# Patient Record
Sex: Female | Born: 1975 | Race: Black or African American | Hispanic: No | Marital: Married | Smoking: Never smoker
Health system: Southern US, Community
[De-identification: ages and names within clinical notes are randomized; demographics above are authoritative.]

## PROBLEM LIST (undated history)

## (undated) DIAGNOSIS — K219 Gastro-esophageal reflux disease without esophagitis: Secondary | ICD-10-CM

## (undated) DIAGNOSIS — M25362 Other instability, left knee: Secondary | ICD-10-CM

## (undated) DIAGNOSIS — F419 Anxiety disorder, unspecified: Secondary | ICD-10-CM

## (undated) DIAGNOSIS — F329 Major depressive disorder, single episode, unspecified: Secondary | ICD-10-CM

## (undated) DIAGNOSIS — T7840XA Allergy, unspecified, initial encounter: Secondary | ICD-10-CM

## (undated) DIAGNOSIS — K2970 Gastritis, unspecified, without bleeding: Secondary | ICD-10-CM

## (undated) DIAGNOSIS — M1990 Unspecified osteoarthritis, unspecified site: Secondary | ICD-10-CM

## (undated) DIAGNOSIS — F32A Depression, unspecified: Secondary | ICD-10-CM

## (undated) DIAGNOSIS — D649 Anemia, unspecified: Secondary | ICD-10-CM

## (undated) DIAGNOSIS — Z90710 Acquired absence of both cervix and uterus: Secondary | ICD-10-CM

## (undated) DIAGNOSIS — G4730 Sleep apnea, unspecified: Secondary | ICD-10-CM

## (undated) DIAGNOSIS — I517 Cardiomegaly: Secondary | ICD-10-CM

## (undated) DIAGNOSIS — K649 Unspecified hemorrhoids: Secondary | ICD-10-CM

## (undated) DIAGNOSIS — I10 Essential (primary) hypertension: Secondary | ICD-10-CM

## (undated) HISTORY — DX: Major depressive disorder, single episode, unspecified: F32.9

## (undated) HISTORY — DX: Essential (primary) hypertension: I10

## (undated) HISTORY — DX: Depression, unspecified: F32.A

## (undated) HISTORY — DX: Gastritis, unspecified, without bleeding: K29.70

## (undated) HISTORY — DX: Acquired absence of both cervix and uterus: Z90.710

## (undated) HISTORY — PX: WISDOM TOOTH EXTRACTION: SHX21

## (undated) HISTORY — DX: Unspecified hemorrhoids: K64.9

## (undated) HISTORY — DX: Allergy, unspecified, initial encounter: T78.40XA

## (undated) HISTORY — DX: Anxiety disorder, unspecified: F41.9

---

## 1997-07-04 ENCOUNTER — Other Ambulatory Visit: Admission: RE | Admit: 1997-07-04 | Discharge: 1997-07-04 | Payer: Self-pay | Admitting: *Deleted

## 1997-08-01 ENCOUNTER — Ambulatory Visit (HOSPITAL_COMMUNITY): Admission: RE | Admit: 1997-08-01 | Discharge: 1997-08-01 | Payer: Self-pay | Admitting: *Deleted

## 1997-09-05 ENCOUNTER — Ambulatory Visit (HOSPITAL_COMMUNITY): Admission: RE | Admit: 1997-09-05 | Discharge: 1997-09-05 | Payer: Self-pay | Admitting: *Deleted

## 1997-10-29 ENCOUNTER — Inpatient Hospital Stay (HOSPITAL_COMMUNITY): Admission: AD | Admit: 1997-10-29 | Discharge: 1997-10-29 | Payer: Self-pay | Admitting: Obstetrics & Gynecology

## 1997-11-12 ENCOUNTER — Inpatient Hospital Stay (HOSPITAL_COMMUNITY): Admission: AD | Admit: 1997-11-12 | Discharge: 1997-11-12 | Payer: Self-pay | Admitting: *Deleted

## 1997-11-13 ENCOUNTER — Inpatient Hospital Stay (HOSPITAL_COMMUNITY): Admission: AD | Admit: 1997-11-13 | Discharge: 1997-11-13 | Payer: Self-pay | Admitting: *Deleted

## 1997-11-13 ENCOUNTER — Inpatient Hospital Stay (HOSPITAL_COMMUNITY): Admission: AD | Admit: 1997-11-13 | Discharge: 1997-11-16 | Payer: Self-pay | Admitting: *Deleted

## 1998-06-14 ENCOUNTER — Other Ambulatory Visit: Admission: RE | Admit: 1998-06-14 | Discharge: 1998-06-14 | Payer: Self-pay

## 1998-06-14 ENCOUNTER — Other Ambulatory Visit: Admission: RE | Admit: 1998-06-14 | Discharge: 1998-06-14 | Payer: Self-pay | Admitting: Obstetrics

## 1999-06-20 ENCOUNTER — Other Ambulatory Visit: Admission: RE | Admit: 1999-06-20 | Discharge: 1999-06-20 | Payer: Self-pay | Admitting: Family Medicine

## 1999-08-26 ENCOUNTER — Inpatient Hospital Stay (HOSPITAL_COMMUNITY): Admission: AD | Admit: 1999-08-26 | Discharge: 1999-08-26 | Payer: Self-pay | Admitting: *Deleted

## 2000-03-21 ENCOUNTER — Emergency Department (HOSPITAL_COMMUNITY): Admission: EM | Admit: 2000-03-21 | Discharge: 2000-03-21 | Payer: Self-pay | Admitting: Emergency Medicine

## 2000-10-11 ENCOUNTER — Inpatient Hospital Stay (HOSPITAL_COMMUNITY): Admission: AD | Admit: 2000-10-11 | Discharge: 2000-10-11 | Payer: Self-pay | Admitting: *Deleted

## 2000-10-19 ENCOUNTER — Inpatient Hospital Stay (HOSPITAL_COMMUNITY): Admission: AD | Admit: 2000-10-19 | Discharge: 2000-10-19 | Payer: Self-pay | Admitting: Obstetrics

## 2000-11-22 ENCOUNTER — Inpatient Hospital Stay (HOSPITAL_COMMUNITY): Admission: AD | Admit: 2000-11-22 | Discharge: 2000-11-22 | Payer: Self-pay | Admitting: Obstetrics and Gynecology

## 2000-12-03 ENCOUNTER — Other Ambulatory Visit: Admission: RE | Admit: 2000-12-03 | Discharge: 2000-12-03 | Payer: Self-pay | Admitting: Obstetrics and Gynecology

## 2000-12-06 ENCOUNTER — Other Ambulatory Visit: Admission: RE | Admit: 2000-12-06 | Discharge: 2000-12-06 | Payer: Self-pay | Admitting: Obstetrics and Gynecology

## 2000-12-20 ENCOUNTER — Ambulatory Visit (HOSPITAL_COMMUNITY): Admission: RE | Admit: 2000-12-20 | Discharge: 2000-12-20 | Payer: Self-pay | Admitting: Obstetrics and Gynecology

## 2000-12-20 ENCOUNTER — Encounter: Payer: Self-pay | Admitting: Obstetrics and Gynecology

## 2001-03-03 ENCOUNTER — Ambulatory Visit (HOSPITAL_COMMUNITY): Admission: RE | Admit: 2001-03-03 | Discharge: 2001-03-03 | Payer: Self-pay | Admitting: Obstetrics and Gynecology

## 2001-03-03 ENCOUNTER — Encounter: Payer: Self-pay | Admitting: Obstetrics and Gynecology

## 2001-05-07 ENCOUNTER — Inpatient Hospital Stay (HOSPITAL_COMMUNITY): Admission: AD | Admit: 2001-05-07 | Discharge: 2001-05-07 | Payer: Self-pay | Admitting: Obstetrics and Gynecology

## 2001-05-08 ENCOUNTER — Inpatient Hospital Stay (HOSPITAL_COMMUNITY): Admission: AD | Admit: 2001-05-08 | Discharge: 2001-05-08 | Payer: Self-pay | Admitting: Obstetrics and Gynecology

## 2001-05-10 ENCOUNTER — Inpatient Hospital Stay (HOSPITAL_COMMUNITY): Admission: AD | Admit: 2001-05-10 | Discharge: 2001-05-12 | Payer: Self-pay | Admitting: Obstetrics and Gynecology

## 2001-05-10 ENCOUNTER — Encounter (INDEPENDENT_AMBULATORY_CARE_PROVIDER_SITE_OTHER): Payer: Self-pay

## 2001-05-20 ENCOUNTER — Inpatient Hospital Stay (HOSPITAL_COMMUNITY): Admission: AD | Admit: 2001-05-20 | Discharge: 2001-05-20 | Payer: Self-pay | Admitting: Obstetrics and Gynecology

## 2001-09-11 ENCOUNTER — Emergency Department (HOSPITAL_COMMUNITY): Admission: EM | Admit: 2001-09-11 | Discharge: 2001-09-11 | Payer: Self-pay | Admitting: Emergency Medicine

## 2002-02-17 ENCOUNTER — Encounter: Admission: RE | Admit: 2002-02-17 | Discharge: 2002-02-17 | Payer: Self-pay | Admitting: Family Medicine

## 2002-08-11 ENCOUNTER — Encounter: Admission: RE | Admit: 2002-08-11 | Discharge: 2002-08-11 | Payer: Self-pay | Admitting: Family Medicine

## 2002-08-18 ENCOUNTER — Encounter (INDEPENDENT_AMBULATORY_CARE_PROVIDER_SITE_OTHER): Payer: Self-pay | Admitting: *Deleted

## 2002-08-18 LAB — CONVERTED CEMR LAB

## 2002-09-11 ENCOUNTER — Emergency Department (HOSPITAL_COMMUNITY): Admission: EM | Admit: 2002-09-11 | Discharge: 2002-09-11 | Payer: Self-pay | Admitting: Emergency Medicine

## 2002-09-11 ENCOUNTER — Encounter: Payer: Self-pay | Admitting: Emergency Medicine

## 2002-09-13 ENCOUNTER — Encounter: Admission: RE | Admit: 2002-09-13 | Discharge: 2002-09-13 | Payer: Self-pay | Admitting: Family Medicine

## 2002-09-13 ENCOUNTER — Other Ambulatory Visit: Admission: RE | Admit: 2002-09-13 | Discharge: 2002-09-13 | Payer: Self-pay | Admitting: Family Medicine

## 2002-11-06 ENCOUNTER — Encounter: Admission: RE | Admit: 2002-11-06 | Discharge: 2002-11-06 | Payer: Self-pay | Admitting: Sports Medicine

## 2002-11-08 ENCOUNTER — Encounter: Admission: RE | Admit: 2002-11-08 | Discharge: 2002-11-08 | Payer: Self-pay | Admitting: Family Medicine

## 2003-03-20 ENCOUNTER — Encounter: Admission: RE | Admit: 2003-03-20 | Discharge: 2003-03-20 | Payer: Self-pay | Admitting: Sports Medicine

## 2003-05-23 ENCOUNTER — Emergency Department (HOSPITAL_COMMUNITY): Admission: EM | Admit: 2003-05-23 | Discharge: 2003-05-24 | Payer: Self-pay | Admitting: Emergency Medicine

## 2003-05-23 ENCOUNTER — Emergency Department (HOSPITAL_COMMUNITY): Admission: EM | Admit: 2003-05-23 | Discharge: 2003-05-23 | Payer: Self-pay | Admitting: Emergency Medicine

## 2003-06-22 ENCOUNTER — Encounter: Admission: RE | Admit: 2003-06-22 | Discharge: 2003-06-22 | Payer: Self-pay | Admitting: Family Medicine

## 2003-07-16 ENCOUNTER — Encounter: Admission: RE | Admit: 2003-07-16 | Discharge: 2003-07-16 | Payer: Self-pay | Admitting: Family Medicine

## 2004-02-04 ENCOUNTER — Ambulatory Visit: Payer: Self-pay | Admitting: Family Medicine

## 2004-03-25 ENCOUNTER — Ambulatory Visit: Payer: Self-pay | Admitting: Family Medicine

## 2004-04-11 ENCOUNTER — Ambulatory Visit: Payer: Self-pay | Admitting: Family Medicine

## 2004-05-02 ENCOUNTER — Ambulatory Visit: Payer: Self-pay | Admitting: Family Medicine

## 2005-05-26 ENCOUNTER — Ambulatory Visit: Payer: Self-pay | Admitting: Family Medicine

## 2005-05-29 ENCOUNTER — Ambulatory Visit: Payer: Self-pay | Admitting: Sports Medicine

## 2005-06-01 ENCOUNTER — Ambulatory Visit: Payer: Self-pay | Admitting: Sports Medicine

## 2005-08-30 ENCOUNTER — Emergency Department (HOSPITAL_COMMUNITY): Admission: EM | Admit: 2005-08-30 | Discharge: 2005-08-30 | Payer: Self-pay | Admitting: Emergency Medicine

## 2006-05-13 DIAGNOSIS — E669 Obesity, unspecified: Secondary | ICD-10-CM | POA: Insufficient documentation

## 2006-05-13 DIAGNOSIS — R519 Headache, unspecified: Secondary | ICD-10-CM | POA: Insufficient documentation

## 2006-05-13 DIAGNOSIS — R51 Headache: Secondary | ICD-10-CM | POA: Insufficient documentation

## 2006-05-14 ENCOUNTER — Encounter (INDEPENDENT_AMBULATORY_CARE_PROVIDER_SITE_OTHER): Payer: Self-pay | Admitting: *Deleted

## 2006-05-26 ENCOUNTER — Telehealth (INDEPENDENT_AMBULATORY_CARE_PROVIDER_SITE_OTHER): Payer: Self-pay | Admitting: *Deleted

## 2006-05-26 ENCOUNTER — Encounter (INDEPENDENT_AMBULATORY_CARE_PROVIDER_SITE_OTHER): Payer: Self-pay | Admitting: Family Medicine

## 2006-05-26 ENCOUNTER — Ambulatory Visit: Payer: Self-pay | Admitting: Sports Medicine

## 2006-05-26 DIAGNOSIS — I10 Essential (primary) hypertension: Secondary | ICD-10-CM | POA: Insufficient documentation

## 2006-06-30 ENCOUNTER — Other Ambulatory Visit: Admission: RE | Admit: 2006-06-30 | Discharge: 2006-06-30 | Payer: Self-pay | Admitting: Family Medicine

## 2006-06-30 ENCOUNTER — Encounter (INDEPENDENT_AMBULATORY_CARE_PROVIDER_SITE_OTHER): Payer: Self-pay | Admitting: Family Medicine

## 2006-06-30 ENCOUNTER — Ambulatory Visit: Payer: Self-pay | Admitting: Family Medicine

## 2006-06-30 LAB — CONVERTED CEMR LAB
Chlamydia, DNA Probe: NEGATIVE
GC Probe Amp, Genital: NEGATIVE

## 2006-07-05 ENCOUNTER — Ambulatory Visit: Payer: Self-pay | Admitting: Family Medicine

## 2006-07-05 ENCOUNTER — Encounter (INDEPENDENT_AMBULATORY_CARE_PROVIDER_SITE_OTHER): Payer: Self-pay | Admitting: Family Medicine

## 2006-07-07 ENCOUNTER — Encounter (INDEPENDENT_AMBULATORY_CARE_PROVIDER_SITE_OTHER): Payer: Self-pay | Admitting: Family Medicine

## 2006-07-07 ENCOUNTER — Telehealth: Payer: Self-pay | Admitting: *Deleted

## 2006-07-07 DIAGNOSIS — D509 Iron deficiency anemia, unspecified: Secondary | ICD-10-CM | POA: Insufficient documentation

## 2006-07-07 LAB — CONVERTED CEMR LAB
ALT: 11 units/L (ref 0–35)
AST: 13 units/L (ref 0–37)
Albumin: 3.9 g/dL (ref 3.5–5.2)
Alkaline Phosphatase: 62 units/L (ref 39–117)
Calcium: 9.2 mg/dL (ref 8.4–10.5)
Chloride: 106 meq/L (ref 96–112)
LDL Cholesterol: 67 mg/dL (ref 0–99)
Platelets: 424 10*3/uL — ABNORMAL HIGH (ref 150–400)
Potassium: 3.7 meq/L (ref 3.5–5.3)
RDW: 17.5 % — ABNORMAL HIGH (ref 11.5–14.0)
Sodium: 138 meq/L (ref 135–145)
TSH: 1.245 microintl units/mL (ref 0.350–5.50)
Total CHOL/HDL Ratio: 2.3

## 2006-07-08 ENCOUNTER — Telehealth (INDEPENDENT_AMBULATORY_CARE_PROVIDER_SITE_OTHER): Payer: Self-pay | Admitting: *Deleted

## 2006-09-16 ENCOUNTER — Encounter (INDEPENDENT_AMBULATORY_CARE_PROVIDER_SITE_OTHER): Payer: Self-pay | Admitting: Family Medicine

## 2007-05-28 ENCOUNTER — Emergency Department (HOSPITAL_COMMUNITY): Admission: EM | Admit: 2007-05-28 | Discharge: 2007-05-28 | Payer: Self-pay | Admitting: Emergency Medicine

## 2007-05-30 ENCOUNTER — Encounter (INDEPENDENT_AMBULATORY_CARE_PROVIDER_SITE_OTHER): Payer: Self-pay | Admitting: Family Medicine

## 2007-06-15 ENCOUNTER — Encounter (INDEPENDENT_AMBULATORY_CARE_PROVIDER_SITE_OTHER): Payer: Self-pay | Admitting: Family Medicine

## 2007-06-15 ENCOUNTER — Ambulatory Visit: Payer: Self-pay | Admitting: Family Medicine

## 2007-06-15 DIAGNOSIS — I517 Cardiomegaly: Secondary | ICD-10-CM | POA: Insufficient documentation

## 2007-06-15 LAB — CONVERTED CEMR LAB
ALT: 15 U/L
AST: 15 U/L
Albumin: 4 g/dL
Alkaline Phosphatase: 79 U/L
BUN: 11 mg/dL
CO2: 26 meq/L
Calcium: 9.9 mg/dL
Chloride: 102 meq/L
Creatinine, Ser: 0.93 mg/dL
Glucose, Bld: 88 mg/dL
Glucose, Urine, Semiquant: NEGATIVE
Ketones, urine, test strip: NEGATIVE
Potassium: 3.8 meq/L
Pro B Natriuretic peptide (BNP): 8 pg/mL
Sodium: 139 meq/L
Specific Gravity, Urine: 1.02
TSH: 0.629 u[IU]/mL
Total Bilirubin: 0.4 mg/dL
Total Protein: 7.5 g/dL
WBC Urine, dipstick: NEGATIVE

## 2007-06-16 ENCOUNTER — Encounter (INDEPENDENT_AMBULATORY_CARE_PROVIDER_SITE_OTHER): Payer: Self-pay | Admitting: Family Medicine

## 2007-06-22 ENCOUNTER — Ambulatory Visit (HOSPITAL_COMMUNITY): Admission: RE | Admit: 2007-06-22 | Discharge: 2007-06-22 | Payer: Self-pay | Admitting: Family Medicine

## 2007-06-22 ENCOUNTER — Encounter: Payer: Self-pay | Admitting: Family Medicine

## 2007-06-22 ENCOUNTER — Ambulatory Visit: Payer: Self-pay | Admitting: Internal Medicine

## 2007-06-22 ENCOUNTER — Telehealth: Payer: Self-pay | Admitting: *Deleted

## 2007-06-23 ENCOUNTER — Encounter (INDEPENDENT_AMBULATORY_CARE_PROVIDER_SITE_OTHER): Payer: Self-pay | Admitting: Family Medicine

## 2007-06-28 ENCOUNTER — Telehealth: Payer: Self-pay | Admitting: *Deleted

## 2007-07-19 ENCOUNTER — Telehealth (INDEPENDENT_AMBULATORY_CARE_PROVIDER_SITE_OTHER): Payer: Self-pay | Admitting: Family Medicine

## 2007-09-20 ENCOUNTER — Telehealth: Payer: Self-pay | Admitting: *Deleted

## 2007-12-16 ENCOUNTER — Ambulatory Visit: Payer: Self-pay | Admitting: Family Medicine

## 2007-12-16 ENCOUNTER — Encounter (INDEPENDENT_AMBULATORY_CARE_PROVIDER_SITE_OTHER): Payer: Self-pay | Admitting: Family Medicine

## 2007-12-22 ENCOUNTER — Encounter (INDEPENDENT_AMBULATORY_CARE_PROVIDER_SITE_OTHER): Payer: Self-pay | Admitting: Family Medicine

## 2008-02-06 ENCOUNTER — Ambulatory Visit: Payer: Self-pay | Admitting: Family Medicine

## 2008-02-06 ENCOUNTER — Telehealth: Payer: Self-pay | Admitting: *Deleted

## 2008-02-06 LAB — CONVERTED CEMR LAB: Rapid Strep: NEGATIVE

## 2008-03-13 ENCOUNTER — Telehealth (INDEPENDENT_AMBULATORY_CARE_PROVIDER_SITE_OTHER): Payer: Self-pay | Admitting: *Deleted

## 2008-10-29 ENCOUNTER — Emergency Department (HOSPITAL_COMMUNITY): Admission: EM | Admit: 2008-10-29 | Discharge: 2008-10-30 | Payer: Self-pay | Admitting: Emergency Medicine

## 2008-12-28 ENCOUNTER — Encounter: Payer: Self-pay | Admitting: *Deleted

## 2008-12-31 ENCOUNTER — Encounter: Payer: Self-pay | Admitting: *Deleted

## 2009-01-01 ENCOUNTER — Encounter: Payer: Self-pay | Admitting: Family Medicine

## 2009-01-01 ENCOUNTER — Ambulatory Visit: Payer: Self-pay | Admitting: Family Medicine

## 2009-01-01 LAB — CONVERTED CEMR LAB
BUN: 14 mg/dL (ref 6–23)
Chloride: 101 meq/L (ref 96–112)
Hemoglobin: 12 g/dL (ref 12.0–15.0)
MCHC: 29.5 g/dL — ABNORMAL LOW (ref 30.0–36.0)
Platelets: 401 10*3/uL — ABNORMAL HIGH (ref 150–400)
Potassium: 3.5 meq/L (ref 3.5–5.3)
RDW: 19.9 % — ABNORMAL HIGH (ref 11.5–15.5)
Sodium: 136 meq/L (ref 135–145)
TSH: 0.984 microintl units/mL (ref 0.350–4.500)

## 2009-01-07 ENCOUNTER — Encounter: Payer: Self-pay | Admitting: Family Medicine

## 2009-05-15 ENCOUNTER — Telehealth: Payer: Self-pay | Admitting: Family Medicine

## 2009-10-01 ENCOUNTER — Ambulatory Visit: Payer: Self-pay | Admitting: Family Medicine

## 2009-10-01 ENCOUNTER — Telehealth: Payer: Self-pay | Admitting: *Deleted

## 2009-10-01 DIAGNOSIS — G479 Sleep disorder, unspecified: Secondary | ICD-10-CM | POA: Insufficient documentation

## 2009-10-23 ENCOUNTER — Ambulatory Visit (HOSPITAL_BASED_OUTPATIENT_CLINIC_OR_DEPARTMENT_OTHER): Admission: RE | Admit: 2009-10-23 | Discharge: 2009-10-23 | Payer: Self-pay | Admitting: Family Medicine

## 2009-10-23 ENCOUNTER — Encounter: Payer: Self-pay | Admitting: Family Medicine

## 2009-10-26 ENCOUNTER — Ambulatory Visit: Payer: Self-pay | Admitting: Internal Medicine

## 2010-01-14 ENCOUNTER — Emergency Department (HOSPITAL_COMMUNITY): Admission: EM | Admit: 2010-01-14 | Discharge: 2010-01-14 | Payer: Self-pay | Admitting: Emergency Medicine

## 2010-01-17 ENCOUNTER — Encounter: Payer: Self-pay | Admitting: Family Medicine

## 2010-04-17 NOTE — Miscellaneous (Signed)
  Clinical Lists Changes  Problems: Removed problem of SCREENING FOR MALIGNANT NEOPLASM OF THE CERVIX (ICD-V76.2) Removed problem of GYNECOLOGICAL EXAMINATION, ROUTINE (ICD-V72.31) Removed problem of PICA (ICD-307.52) Removed problem of AFTERCARE, LONG-TERM USE, MEDICATIONS NEC (ICD-V58.69) Removed problem of FATIGUE (ICD-780.79) Removed problem of SCREENING FOR LIPOID DISORDERS (ICD-V77.91)

## 2010-04-17 NOTE — Assessment & Plan Note (Signed)
Summary: elevated bp,df   Vital Signs:  Patient profile:   35 year old female Height:      60.75 inches Weight:      179 pounds BMI:     34.22 Temp:     98.8 degrees F oral Pulse rate:   85 / minute BP sitting:   137 / 99  (left arm) Cuff size:   regular  Vitals Entered By: Garen Grams LPN (October 01, 2009 9:55 AM) CC: elevated BP Is Patient Diabetic? No Pain Assessment Patient in pain? no        CC:  elevated BP.  History of Present Illness: 1. Elevated BP:  - Pt tried to donate plasma but was told that her blood pressure was too high.  It was 157/116 according to patient at the center.  This was last wednesday - She hasn't checked her BP at home so unsure how it has been since then. - She is taking her Norvasc and HCTZ as prescribed  ROS: denies chest pain or shortness of breath  2. Sleep disorder - Snores at night - Is tired throughout the day - Has not been told that she stops breathing at night  ROS: endorses occ headache  3. Obesity - Doesn't watch what she eats - Has been gaining weight - Has been trying to start exercising  Habits & Providers  Alcohol-Tobacco-Diet     Tobacco Status: never  Current Medications (verified): 1)  Hydrochlorothiazide 25 Mg Tabs (Hydrochlorothiazide) .... Take 1 Tablet By Mouth Once A Day 2)  Amlodipine Besylate 10 Mg Tabs (Amlodipine Besylate) .... One By Mouth Daily 3)  Losartan Potassium 25 Mg Tabs (Losartan Potassium) .... Take 1 Tab By Mouth Daily  Allergies: No Known Drug Allergies  Past History:  Past Medical History: Reviewed history from 12/16/2007 and no changes required. anemia HTN  Social History: Reviewed history from 01/01/2009 and no changes required. Getting divorced.  3 kids at home (11, 10,6 in 2009).    Husband works at [employer] and does freight loading.  Denies tobacco, etoh, drugs.  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 Taking business management classes at [employer] (hopes to finish fall  2011).  Physical Exam  General:  General:  Vital signs reviewed -- overweight, hypertensive.  Alert, appropriate; well-dressed and well-nourished Lungs:  work of breathing unlabored, clear to auscultation bilaterally; no wheezes, rales, or ronchi; good air movement throughout Heart:  regular rate and rhythm, no murmurs; normal s1/s2 Abd: soft, NT/ND. No masses. + BS.   Pulses:  DP and radial pulses 2+ bilaterally  Extremities:  no cyanosis, clubbing; + trace LE edema Neurologic:  alert and oriented. speech normal.    Impression & Recommendations:  Problem # 1:  HYPERTENSION, BENIGN (ICD-401.1) Assessment Unchanged  Diastolic blood pressure is not at goal.  Will add Losartan.  Monitor for cough since she couldn't tolerate an ACEI. Her updated medication list for this problem includes:    Hydrochlorothiazide 25 Mg Tabs (Hydrochlorothiazide) .Marland Kitchen... Take 1 tablet by mouth once a day    Amlodipine Besylate 10 Mg Tabs (Amlodipine besylate) ..... One by mouth daily    Losartan Potassium 25 Mg Tabs (Losartan potassium) .Marland Kitchen... Take 1 tab by mouth daily  Orders: [facility]- Est  Level 4 (44010)  Problem # 2:  SLEEP DISORDER (ICD-780.50) Assessment: New Refer for sleep study.  May be related to elevated blood pressure Orders: Diagnostic Polysomnogram (Diagnostic Polysomno) [facility]- Est  Level 4 (27253)  Problem # 3:  OBESITY, NOS (ICD-278.00) Assessment: Unchanged  Went over brief dietary interventions.  Advised her to make an appointment to discuss only diet and nutrition.   Orders: [facility]- Est  Level 4 (16109)  Complete Medication List: 1)  Hydrochlorothiazide 25 Mg Tabs (Hydrochlorothiazide) .... Take 1 tablet by mouth once a day 2)  Amlodipine Besylate 10 Mg Tabs (Amlodipine besylate) .... One by mouth daily 3)  Losartan Potassium 25 Mg Tabs (Losartan potassium) .... Take 1 tab by mouth daily  Patient Instructions: 1)  For your blood pressure I am going to start you on another medicine  (Losartan) 2)  This is a similar medicine to Lisinopril so it may cause a cough, if it does please let me know 3)  We will get you set up for that sleep study 4)  Please schedule an appointment to discuss nutrition and weight loss when convenient 5)  Please schedule a follow up appointment to recheck your blood pressure in 6 weeks Prescriptions: LOSARTAN POTASSIUM 25 MG TABS (LOSARTAN POTASSIUM) Take 1 tab by mouth daily  #30 x 3   Entered and Authorized by:   Angelena Sole MD   Signed by:   Angelena Sole MD on 10/01/2009   Method used:   Electronically to        [facility] [address] (retail)       [address]       [place], [place]  [id number]       Ph: [phone]       Fax: [phone]   RxID:   [id number]

## 2010-04-17 NOTE — Progress Notes (Signed)
Summary: Rx Prob  Phone Note Call from Patient Call back at Home Phone [phone]   Caller: Patient Summary of Call: Would like to have the rx that was sent in today for her sent to [facility] [address]   Initial call taken by: Clydell Hakim,  October 01, 2009 11:41 AM  Follow-up for Phone Call        done.  Attempted to call.  LMOVM that rx was sent to [facility] [address] Follow-up by: Jone Baseman CMA,  October 01, 2009 12:07 PM    Prescriptions: LOSARTAN POTASSIUM 25 MG TABS (LOSARTAN POTASSIUM) Take 1 tab by mouth daily  #30 x 3   Entered by:   Jone Baseman CMA   Authorized by:   Angelena Sole MD   Signed by:   Jone Baseman CMA on 10/01/2009   Method used:   Electronically to        [facility] [phone]* (retail)       [address]       [place], [place]  [address]       Ph: [phone]       Fax: [phone]   RxID:   [id number]

## 2010-04-17 NOTE — Consult Note (Signed)
Summary: Sleep Study  Sleep Study   Imported By: Clydell Hakim 11/07/2009 11:13:46  _____________________________________________________________________  External Attachment:    Type:   Image     Comment:   External Document

## 2010-04-17 NOTE — Progress Notes (Signed)
Summary: Rx Req  Phone Note Refill Request Call back at [phone] UTIL 2  Message from:  Patient  Refills Requested: Medication #1:  AMLODIPINE BESYLATE 10 MG TABS one by mouth daily. PT USES [facility] [address]  Initial call taken by: Clydell Hakim,  May 15, 2009 9:11 AM  Follow-up for Phone Call        will forward to MD. Follow-up by: Theresia Lo RN,  May 15, 2009 10:14 AM    Prescriptions: AMLODIPINE BESYLATE 10 MG TABS (AMLODIPINE BESYLATE) one by mouth daily  #30 x 6   Entered and Authorized by:   Angelena Sole MD   Signed by:   Angelena Sole MD on 05/20/2009   Method used:   Electronically to        [facility] [address] (retail)       [address]       [place], [place]  [id number]       Ph: [phone]       Fax: [phone]   RxID:   [id number]

## 2010-04-23 ENCOUNTER — Other Ambulatory Visit: Payer: Self-pay | Admitting: Family Medicine

## 2010-05-01 ENCOUNTER — Encounter: Payer: Self-pay | Admitting: *Deleted

## 2010-05-01 NOTE — Progress Notes (Signed)
Intolerant to ACEI because of cough.  Will fill out PA form.

## 2010-05-01 NOTE — Progress Notes (Signed)
PA required for Losartan potassium. Form placed in MD box.   received PA approval . Pharmacy notified.

## 2010-05-21 ENCOUNTER — Encounter: Payer: Self-pay | Admitting: Family Medicine

## 2010-05-21 ENCOUNTER — Ambulatory Visit (INDEPENDENT_AMBULATORY_CARE_PROVIDER_SITE_OTHER): Payer: Medicaid Other | Admitting: Family Medicine

## 2010-05-21 VITALS — BP 139/97 | HR 83 | Temp 98.0°F | Ht 60.75 in | Wt 181.4 lb

## 2010-05-21 DIAGNOSIS — F419 Anxiety disorder, unspecified: Secondary | ICD-10-CM

## 2010-05-21 DIAGNOSIS — F411 Generalized anxiety disorder: Secondary | ICD-10-CM

## 2010-05-21 DIAGNOSIS — I10 Essential (primary) hypertension: Secondary | ICD-10-CM

## 2010-05-21 MED ORDER — HYDROCHLOROTHIAZIDE 25 MG PO TABS: 25.0000 mg | ORAL_TABLET | Freq: Every day | ORAL | Status: DC

## 2010-05-21 MED ORDER — PROPRANOLOL HCL 20 MG PO TABS: 20.0000 mg | ORAL_TABLET | Freq: Two times a day (BID) | ORAL | Status: DC | PRN

## 2010-05-21 NOTE — Assessment & Plan Note (Signed)
Pt with some symptoms of anxiety.  Does not seem to interfere with her normal daily activity.  She is not interested in starting a daily medication.  Will try Propranolol taken as needed.

## 2010-05-21 NOTE — Patient Instructions (Signed)
We will restart the HCTZ for your blood pressure, also try and cut back on the salt I am going to add Inderall for the anxiety.  Take it as needed when you feel anxious.  This takes about 30 minutes to work Please schedule a PAP when it is convenient for you

## 2010-05-21 NOTE — Assessment & Plan Note (Signed)
Diastolic BP not at goal.  She has stopped taking the HCTZ on her own.  Will restart this.  Also advised her to cut back on the salt.

## 2010-05-21 NOTE — Progress Notes (Signed)
  Subjective:    Patient ID: Wendy Duffy, female    DOB: 05-29-1975, 35 y.o.   MRN: [id number]  Hypertension This is a chronic problem. The current episode started more than 1 year ago. The problem is unchanged. The problem is uncontrolled. Associated symptoms include anxiety. Pertinent negatives include no blurred vision, chest pain, headaches, palpitations, peripheral edema or shortness of breath. There are no associated agents to hypertension. Risk factors for coronary artery disease include sedentary lifestyle. Past treatments include angiotensin blockers and calcium channel blockers (stopped taking the HCTZ on her own). The current treatment provides mild improvement.  Anxiety Presents for initial visit. Onset was 1 to 5 years ago. The problem has been gradually worsening. Symptoms include decreased concentration, excessive worry, irritability, nervous/anxious behavior, obsessions and restlessness. Patient reports no chest pain, compulsions, confusion, depressed mood, hyperventilation, impotence, insomnia, palpitations, panic, shortness of breath or suicidal ideas. Symptoms occur most days. The severity of symptoms is moderate. The symptoms are aggravated by nothing. The quality of sleep is fair. Nighttime awakenings: occasional.   Risk factors include no known risk factors. Past treatments include nothing.      Review of Systems  Constitutional: Positive for irritability.  Eyes: Negative for blurred vision.  Respiratory: Negative for shortness of breath.   Cardiovascular: Negative for chest pain and palpitations.  Genitourinary: Negative for impotence.  Neurological: Negative for headaches.  Psychiatric/Behavioral: Positive for decreased concentration. Negative for suicidal ideas and confusion. The patient is nervous/anxious. The patient does not have insomnia.        Objective:   Physical Exam  Constitutional: She is oriented to person, place, and time. No distress.        Overweight   HENT:  Head: Normocephalic and atraumatic.  Eyes: EOM are normal. Pupils are equal, round, and reactive to light.  Neck: Neck supple. No thyromegaly present.  Cardiovascular: Normal rate, regular rhythm and normal heart sounds.   No murmur heard. Pulmonary/Chest: Effort normal and breath sounds normal. No respiratory distress. She has no wheezes.  Abdominal: Soft. She exhibits no distension. There is no tenderness. There is no rebound.  Musculoskeletal: Normal range of motion. She exhibits no edema.  Neurological: She is alert and oriented to person, place, and time.  Skin: Skin is warm and dry.  Psychiatric:       Moderately anxious          Assessment & Plan:

## 2010-05-27 LAB — BASIC METABOLIC PANEL
CO2: 23 mEq/L (ref 19–32)
Calcium: 8.3 mg/dL — ABNORMAL LOW (ref 8.4–10.5)
Creatinine, Ser: 1.24 mg/dL — ABNORMAL HIGH (ref 0.4–1.2)
GFR calc Af Amer: 60 mL/min — ABNORMAL LOW (ref >60)
GFR calc non Af Amer: 50 mL/min — ABNORMAL LOW (ref >60)

## 2010-05-27 LAB — CBC
MCH: 26 pg (ref 26.0–34.0)
Platelets: 337 10*3/uL (ref 150–400)
RBC: 5.26 MIL/uL — ABNORMAL HIGH (ref 3.87–5.11)

## 2010-05-27 LAB — DIFFERENTIAL
Basophils Relative: 1 % (ref 0–1)
Eosinophils Absolute: 0.8 10*3/uL — ABNORMAL HIGH (ref 0.0–0.7)
Lymphs Abs: 3.2 10*3/uL (ref 0.7–4.0)
Neutrophils Relative %: 47 % (ref 43–77)

## 2010-05-27 LAB — POCT CARDIAC MARKERS: Myoglobin, poc: 22.3 ng/mL (ref 12–200)

## 2010-06-12 ENCOUNTER — Encounter: Payer: Medicaid Other | Admitting: Family Medicine

## 2010-06-16 ENCOUNTER — Other Ambulatory Visit: Payer: Self-pay | Admitting: Family Medicine

## 2010-06-16 NOTE — Telephone Encounter (Signed)
Refill request

## 2010-06-18 NOTE — Telephone Encounter (Signed)
Refill request

## 2010-06-21 LAB — D-DIMER, QUANTITATIVE: D-Dimer, Quant: 0.37 ug/mL-FEU (ref 0.00–0.48)

## 2010-06-21 LAB — POCT CARDIAC MARKERS: Troponin i, poc: <0.05 ng/mL (ref 0.00–0.09)

## 2010-06-21 LAB — CBC
Hemoglobin: 9.6 g/dL — ABNORMAL LOW (ref 12.0–15.0)
MCHC: 32.1 g/dL (ref 30.0–36.0)
MCV: 68.2 fL — ABNORMAL LOW (ref 78.0–100.0)
RBC: 4.39 MIL/uL (ref 3.87–5.11)

## 2010-06-21 LAB — BASIC METABOLIC PANEL
CO2: 23 mEq/L (ref 19–32)
Chloride: 108 mEq/L (ref 96–112)
GFR calc Af Amer: >60 mL/min (ref >60)
Sodium: 137 mEq/L (ref 135–145)

## 2010-06-21 LAB — DIFFERENTIAL
Basophils Relative: 0 % (ref 0–1)
Eosinophils Absolute: 0.5 10*3/uL (ref 0.0–0.7)
Monocytes Absolute: 0.8 10*3/uL (ref 0.1–1.0)
Neutro Abs: 4.1 10*3/uL (ref 1.7–7.7)

## 2010-08-01 NOTE — Discharge Summary (Signed)
[facility]  Patient:    Wendy Duffy, Wendy Duffy Visit Number: [id number] MRN: [id number]          Service Type: MED Location: [facility] Attending Physician:  Silverio Lay A Dictated by:   Pierre Bali Normand Sloop, M.D. Admit Date:  05/20/2001 Discharge Date: 05/20/2001                             Discharge Summary  DISCHARGE MEDICATIONS:        Tylox, Aldomet.  PROCEDURES:                   The patient had a repeat cesarean section and bilateral tubal ligation.  HOSPITAL COURSE:              The patient remained afebrile with stable vital signs.  Blood pressure in the 136/80-90.  The incision remained clean, dry and intact.  Postoperative hemoglobin was stable.  The patient was postoperative day #2 status post cesarean section with stable vital signs and desiring to go home.  She was on Aldomet.  DISPOSITION:                  She was to follow-up in the office with blood pressure check and staple removal.  She will remain on pelvic rest.  Discharge precautions were given. Dictated by:   Pierre Bali. Normand Sloop, M.D. Attending Physician:  Esmeralda Arthur DD:  06/30/01 TD:  06/30/01 Job: [id number]

## 2010-08-01 NOTE — Op Note (Signed)
[facility]  Patient:    Wendy Duffy, Wendy Duffy Visit Number: [id number] MRN: [id number]          Service Type: OBS Location: 910A 9115 [id number] Attending Physician:  Michael Litter Proc. Date: 05/10/01 Admit Date:  05/10/2001                             Operative Report  PREOPERATIVE DIAGNOSIS:       Pregnancy at term, desires repeat cesarean section and sterilization.  History of cesarean section in the past and an infant with osteogenesis imperfecta.  POSTOPERATIVE DIAGNOSIS:      Pregnancy at term, desires repeat cesarean section and sterilization.  History of cesarean section in the past and an infant with osteogenesis imperfecta.  OPERATION:                    Repeat cesarean section and bilateral tubal ligation.  SURGEON:                      Dr. Normand Sloop  ASSISTANT:                    Nigel Bridgeman, C.N.M.  ANESTHESIA:                   Spinal.  ESTIMATED BLOOD LOSS:         500 cc.  IV FLUIDS:                    3000 cc crystalloid.  URINE OUTPUT:                 550 cc clear urine.  COMPLICATIONS:                None.  FINDINGS:                     Female infant in vertex presentation with Apgars of 9 and 9, born at 94.  Clear fluid, weight was 6 pounds 11 ounces. The patient had normal uterus, tubes, and ovaries bilaterally.  DESCRIPTION OF PROCEDURE:     The patient was taken to the operating room where she was given spinal anesthesia, put in dorsal supine position with a left lateral tilt, and prepped and draped in the usual sterile fashion. Foley catheter was placed.  A Pfannenstiel skin incision was made with a knife over her previous incision.  The incision was carried down to the fascia using Bovie cautery.  The fascia was incised using the Bovie and extended bilaterally using Bovie cautery.  Kochers x2 was placed on the anterior aspect of the fascia which was elevated off the rectus muscle both bluntly and sharply using Bovie  cautery.  The inferior aspect of the fascia was elevated off of the rectus muscle in a similar fashion.  The rectus muscles were then separated in the midline.  The peritoneum was identified, tented up, and entered sharply with Metzenbaum scissors.  The rectus muscles were then further separated and the vesicouterine peritoneum was identified, tented up, and entered sharply.  The bladder flap was then created both sharply and digitally.  The bladder blade was then inserted.  A low transverse incision was then made with the knife and the incision was extended bluntly.  The infants head was vacuum assisted delivered out of the incision with one pull at 500 mmHg in the green zone.  The vacuum was then removed.  The nares and mouth were suctioned with bulb suction.  There was no nuchal cord and no meconium. The body was delivered and handed to pediatrics in attendance. Cord was clamped and cut.  Cord gases were sent.  The placenta was delivered and the uterus was then cleared of all clot and debris.  The uterine incision was repaired with 0 Vicryl in a running locked fashion.  Hemostasis was assured. Attention was then turned to the patients left fallopian tube which was located and grasped with Babcock clamp.  Followed out to the fimbriated end, about 2 cm of tube was ligated and resected in the midisthmic portion of the tube. Hemostasis was assured.  The patients right fallopian tube was located, identified, and ligated in a similar fashion.  Hemostasis was assured.  It was ligated with 2-0 plain.  Attention was then turned to the uterine incision which was found to be hemostatic.  The rectus muscles were then reapproximated using 0 Vicryl.  The fascia was closed with 0 Vicryl in a running fashion. The subcutaneous tissue was irrigated and made hemostatic using Bovie cautery. The skin was closed with staples.  Sponge, needle, and instrument counts were correct x2.  The patient went to the  recovery room in stable condition. Attending Physician:  Michael Litter DD:  05/10/01 TD:  05/10/01 Job: [id number]

## 2010-08-01 NOTE — H&P (Signed)
[facility]  PatientKAELAN, Wendy Duffy Visit Number: [id number] MRN: [id number]          Service Type: Attending:  Naima A. Normand Sloop, M.D. Dictated by:   Pierre Bali. Normand Sloop, M.D.                           History and Physical  HISTORY:                      Patient is a 35 year old G6, P2-0-3-2.  Last menstrual period was August 14, 2000.  Estimated date of confinement of May 21, 2001 consistent with 18 week ultrasound.  Presenting at 38 3/7 weeks for repeat cesarean section.  Patient has a history of an infant born with osteogenesis imperfecta and has chosen to go with a cesarean section and tubal ligation with this pregnancy.  Patients complications of this pregnancy are history of a previous cesarean section with subsequent VBAC; however, desires repeat cesarean section and tubal ligation.  Patient has signed tubal papers. She has a history of an infant with osteogenesis imperfecta and was referred to genetic counseling.  She has chronic history currently on Aldomet 250 mg t.i.d.  Has had NSTs two times a week and growth scans every four weeks. Patient had bilateral renal pyelectasis on her first ultrasound at 18 weeks which resolved on an ultrasound at 28 weeks.  PAST OBSTETRICAL HISTORY:     Significant for elective abortion x3 all in the first trimester without any complications.  She had a cesarean section in 1998 secondary to face presentation and had a subsequent VBAC in September 1999 without any complications.  PAST GYNECOLOGIC HISTORY:     Significant for history of chlamydia and Trichomonas.  Also had a colposcopy in 2000 but her last Pap was within normal limits.  SOCIAL HISTORY:               Negative x3.  PRENATAL LABORATORIES:        Hemoglobin 13.4.  Platelets 241,000.  She is O+. Antibody negative.  Sickle cell trait was negative.  RPR was nonreactive.  She is rubella immune.  Hepatitis B surface antigen was negative.  HIV  was nonreactive.  Pap test is within normal limits.  She did have 100,000 E. coli in her urine.  Test of cure was negative.  GC and chlamydia both negative. One hour glucola was 75.  AFP and free beta were within normal range.                                Patient is pregnant at term, desires repeat cesarean section and bilateral tubal ligation.  Patient told that the risks are, but not limited to, bleeding, infection, damage to internal organs such as bowel and bladder.  Patient understands and will have a repeat cesarean section and tubal ligation in the morning. Dictated by:   Pierre Bali. Normand Sloop, M.D. Attending:  Naima A. Dillard, M.D. DD:  05/09/01 TD:  05/09/01 Job: [id number]

## 2010-11-22 ENCOUNTER — Other Ambulatory Visit: Payer: Self-pay | Admitting: Family Medicine

## 2010-11-23 NOTE — Telephone Encounter (Signed)
Refill request

## 2010-11-24 NOTE — Telephone Encounter (Signed)
Will give 2 months of Norvasc. Patient started on HCTZ in March 2012. Will need follow-up with me next 1-2 months for labs and to re-evaluate blood pressure.

## 2010-12-04 ENCOUNTER — Encounter: Payer: Medicaid Other | Admitting: Family Medicine

## 2010-12-08 LAB — DIFFERENTIAL
Basophils Absolute: 0
Lymphocytes Relative: 41
Monocytes Absolute: 0.7
Neutro Abs: 2.3
Neutrophils Relative %: 40 — ABNORMAL LOW

## 2010-12-08 LAB — POCT I-STAT CREATININE: Operator id: 151321

## 2010-12-08 LAB — I-STAT 8, (EC8 V) (CONVERTED LAB)
Acid-Base Excess: 1
Chloride: 103
HCT: 40
Hemoglobin: 13.6
Potassium: 3.4 — ABNORMAL LOW
Sodium: 138
pH, Ven: 7.329 — ABNORMAL HIGH

## 2010-12-08 LAB — CBC
Hemoglobin: 11 — ABNORMAL LOW
RBC: 5.4 — ABNORMAL HIGH
RDW: 19.8 — ABNORMAL HIGH

## 2010-12-08 LAB — POCT CARDIAC MARKERS: Myoglobin, poc: 21.9

## 2010-12-12 ENCOUNTER — Ambulatory Visit (INDEPENDENT_AMBULATORY_CARE_PROVIDER_SITE_OTHER): Payer: BC Managed Care – HMO | Admitting: Family Medicine

## 2010-12-12 ENCOUNTER — Encounter: Payer: Self-pay | Admitting: Family Medicine

## 2010-12-12 ENCOUNTER — Other Ambulatory Visit (HOSPITAL_COMMUNITY)
Admission: RE | Admit: 2010-12-12 | Discharge: 2010-12-12 | Disposition: A | Discharge status: Home / Self Care | Payer: BC Managed Care – HMO | Source: Ambulatory Visit | Attending: Family Medicine | Admitting: Family Medicine

## 2010-12-12 ENCOUNTER — Encounter: Payer: Self-pay | Admitting: *Deleted

## 2010-12-12 DIAGNOSIS — G479 Sleep disorder, unspecified: Secondary | ICD-10-CM

## 2010-12-12 DIAGNOSIS — F411 Generalized anxiety disorder: Secondary | ICD-10-CM

## 2010-12-12 DIAGNOSIS — I10 Essential (primary) hypertension: Secondary | ICD-10-CM

## 2010-12-12 DIAGNOSIS — Z01419 Encounter for gynecological examination (general) (routine) without abnormal findings: Secondary | ICD-10-CM | POA: Insufficient documentation

## 2010-12-12 DIAGNOSIS — Z124 Encounter for screening for malignant neoplasm of cervix: Secondary | ICD-10-CM

## 2010-12-12 DIAGNOSIS — A749 Chlamydial infection, unspecified: Secondary | ICD-10-CM

## 2010-12-12 DIAGNOSIS — N760 Acute vaginitis: Secondary | ICD-10-CM

## 2010-12-12 DIAGNOSIS — Z0000 Encounter for general adult medical examination without abnormal findings: Secondary | ICD-10-CM

## 2010-12-12 DIAGNOSIS — F419 Anxiety disorder, unspecified: Secondary | ICD-10-CM

## 2010-12-12 DIAGNOSIS — A7489 Other chlamydial diseases: Secondary | ICD-10-CM

## 2010-12-12 DIAGNOSIS — Z20828 Contact with and (suspected) exposure to other viral communicable diseases: Secondary | ICD-10-CM

## 2010-12-12 DIAGNOSIS — E669 Obesity, unspecified: Secondary | ICD-10-CM

## 2010-12-12 LAB — CBC
HCT: 38.8 % (ref 36.0–46.0)
MCV: 82 fL (ref 78.0–100.0)
RBC: 4.73 MIL/uL (ref 3.87–5.11)
WBC: 7.9 10*3/uL (ref 4.0–10.5)

## 2010-12-12 LAB — RPR

## 2010-12-12 NOTE — Assessment & Plan Note (Signed)
Pap smear today.  Wants to also be tested for STIs. Flu shot today.

## 2010-12-12 NOTE — Assessment & Plan Note (Signed)
Controlled on current medications. Continue. Checking monitoring labs today.

## 2010-12-12 NOTE — Progress Notes (Signed)
  Subjective:    Patient ID: Wendy Duffy, female    DOB: [birth year], 35 y.o.   MRN: [id number]  HPI This is a 35 YO AA F c h/o HTN and anxiety. Here for yearly physical.  1. Obesity Started working again Not dieting or on exercise regimen  2. HTN Compliant with medications ROS: denies CP, DOE, LE edema  3. Difficulty staying asleep sometimes Unchanged Still waking up sometimes around 2am and not able to fall back asleep Doesn't feel rested in morning some times. Thinks she snores.  4. Anxiety Uses propranolol maybe twice weekly Helps her anxiety but does make her sleepy ROS: denies symptoms of depression except for sleep issue above  Review of Systems Per HPI    Objective:   Physical Exam  Constitutional: She is oriented to person, place, and time. No distress.       obese  HENT:  Head: Normocephalic and atraumatic.  Eyes: Conjunctivae are normal.  Neck: Normal range of motion. Neck supple. No JVD present. No tracheal deviation present. No thyromegaly present.  Cardiovascular: Normal rate, regular rhythm, normal heart sounds and intact distal pulses.  Exam reveals no gallop.   No murmur heard. Pulmonary/Chest: Breath sounds normal. No respiratory distress. She has no wheezes. She has no rales.  Abdominal: Soft. Bowel sounds are normal. She exhibits no distension and no mass. There is no tenderness. There is no rebound and no guarding.  Genitourinary: Vagina normal and uterus normal.       Scant white vaginal discharge  Musculoskeletal: Normal range of motion. She exhibits no edema.  Lymphadenopathy:    She has no cervical adenopathy.  Neurological: She is alert and oriented to person, place, and time. She displays normal reflexes. She exhibits normal muscle tone.  Skin: Skin is dry. No rash noted. No erythema.  Psychiatric: She has a normal mood and affect. Her behavior is normal. Judgment and thought content normal.          Assessment & Plan:

## 2010-12-12 NOTE — Assessment & Plan Note (Signed)
Commended and encouraged weight loss.  Recommended healthy diet, regular exercise.

## 2010-12-12 NOTE — Assessment & Plan Note (Signed)
Symptoms seems controlled on occasional propranolol which she uses twice weekly. Continue.  Follow-up 6 months.

## 2010-12-12 NOTE — Patient Instructions (Signed)
It was nice to meet you today.  If your lab results are normal, I will send you a letter with the results. If abnormal, someone at the clinic will get in touch with you.   Follow-up in 6 months for your blood pressure and anxiety medications.  Keep up the good work with your losing weight.

## 2010-12-12 NOTE — Assessment & Plan Note (Signed)
Per patient, had sleep study done recently but found normal. But she does have many symptoms and signs of OSA. Encourage weight loss for now.

## 2010-12-13 ENCOUNTER — Telehealth: Payer: Self-pay | Admitting: Family Medicine

## 2010-12-13 LAB — BASIC METABOLIC PANEL
Calcium: 9.4 mg/dL (ref 8.4–10.5)
Potassium: 3.7 mEq/L (ref 3.5–5.3)
Sodium: 139 mEq/L (ref 135–145)

## 2010-12-13 LAB — HIV ANTIBODY (ROUTINE TESTING W REFLEX): HIV: NONREACTIVE

## 2010-12-13 NOTE — Telephone Encounter (Signed)
Called and left a message and asked pt to call back regarding her recent glucose of 38 in lab on the 12/12/10. She will page me.

## 2010-12-15 ENCOUNTER — Encounter: Payer: Self-pay | Admitting: Family Medicine

## 2010-12-15 DIAGNOSIS — A749 Chlamydial infection, unspecified: Secondary | ICD-10-CM | POA: Insufficient documentation

## 2010-12-15 LAB — GC/CHLAMYDIA PROBE AMP, GENITAL
Chlamydia, DNA Probe: POSITIVE — AB
GC Probe Amp, Genital: NEGATIVE

## 2010-12-15 MED ORDER — AZITHROMYCIN 1 G PO PACK: 1.0000 | PACK | Freq: Once | ORAL | Status: DC

## 2010-12-15 MED ORDER — AZITHROMYCIN 1 G PO PACK: 1.0000 | PACK | Freq: Once | ORAL | Status: AC

## 2010-12-15 NOTE — Progress Notes (Signed)
Addended by: Madolyn Frieze, Marylene Land J on: 12/15/2010 05:55 PM   Modules accepted: Orders

## 2010-12-15 NOTE — Assessment & Plan Note (Addendum)
Found on routine STI screening. Will treat with azithromycin 1gm x1.  Will give Rx for partner as well. Avoid sex for 1 week after taking antibiotic.   Update: patient's voice mail box is full. Will send letter with results and notifying her prescriptions were sent.

## 2010-12-15 NOTE — Progress Notes (Signed)
Addended by: Madolyn Frieze, Marylene Land J on: 12/15/2010 05:53 PM   Modules accepted: Orders

## 2010-12-18 ENCOUNTER — Other Ambulatory Visit: Payer: BC Managed Care – HMO

## 2010-12-18 ENCOUNTER — Other Ambulatory Visit: Payer: Self-pay | Admitting: Family Medicine

## 2010-12-18 ENCOUNTER — Telehealth: Payer: Self-pay | Admitting: Family Medicine

## 2010-12-18 DIAGNOSIS — A749 Chlamydial infection, unspecified: Secondary | ICD-10-CM

## 2010-12-18 MED ORDER — AZITHROMYCIN 1 G PO PACK: 1.0000 | PACK | Freq: Once | ORAL | Status: AC

## 2010-12-18 NOTE — Telephone Encounter (Signed)
Addended by: Madolyn Frieze, Marylene Land J on: 12/18/2010 02:43 PM   Modules accepted: Orders

## 2010-12-18 NOTE — Telephone Encounter (Signed)
Ms. Wendy Duffy called back because she thought she had missed the call from the office.

## 2010-12-18 NOTE — Progress Notes (Signed)
CBG done today, reading 92

## 2010-12-18 NOTE — Telephone Encounter (Signed)
Patient is concerned about her low blood sugar reading. She will stop by today for a fingerstick glucose check. Notified Britta Mccreedy who will let lab know.  Will also send another Rx for azithromycin slurry for treatment for partner (pt positive for chlamydia).

## 2010-12-18 NOTE — Telephone Encounter (Signed)
Called patient back regarding this blood glucose level. Her voicemail was full so I was unable to leave a message. I would recommend going to clinic today or tomorrow for a fingerstick glucose.  Will route to Dr. Madolyn Frieze and [facility] team.

## 2010-12-18 NOTE — Telephone Encounter (Signed)
Patient returned call regarding glucose level.  Pt is quite anxious about this.  Need to speak with someone asap to inquire what that low level could actual create.

## 2010-12-31 ENCOUNTER — Other Ambulatory Visit: Payer: Self-pay | Admitting: Family Medicine

## 2010-12-31 NOTE — Telephone Encounter (Signed)
Refill request

## 2011-02-10 ENCOUNTER — Other Ambulatory Visit: Payer: Self-pay | Admitting: Family Medicine

## 2011-02-10 NOTE — Telephone Encounter (Signed)
Refill request

## 2011-02-20 ENCOUNTER — Ambulatory Visit: Payer: BC Managed Care – HMO | Admitting: Family Medicine

## 2011-03-12 ENCOUNTER — Encounter: Payer: Self-pay | Admitting: Family Medicine

## 2011-03-12 ENCOUNTER — Ambulatory Visit (INDEPENDENT_AMBULATORY_CARE_PROVIDER_SITE_OTHER): Payer: BC Managed Care – HMO | Admitting: Family Medicine

## 2011-03-12 DIAGNOSIS — R42 Dizziness and giddiness: Secondary | ICD-10-CM

## 2011-03-12 DIAGNOSIS — R109 Unspecified abdominal pain: Secondary | ICD-10-CM | POA: Insufficient documentation

## 2011-03-12 NOTE — Assessment & Plan Note (Signed)
Nonspecific symptoms with no evidence of focal neurologic signs.  Likely multifactorial with increased stressed, decreased frequency of eating, viral uri,  complicated by anxiety disorder.  Advised to eat regular meals, taking ibuprofen instead of hydrocodone for pain to avoid next morning drowsiness and increase fluids.    Given red flags for return or for urgent evaluation.

## 2011-03-12 NOTE — Assessment & Plan Note (Signed)
Nonspecific, now resolved.  Likely dyspepsia after long periods of time not eating.  Advised eating regularly and if recurs may have trial of zantac or other OTC  Medication for GERD.

## 2011-03-12 NOTE — Patient Instructions (Signed)
At regular meals- at least every 3-4 hours.  Try to make sure each snack has a little protein in it.  Drink lots of water- stay well hydrated  Follow-up if worsening or new symtpoms

## 2011-03-12 NOTE — Progress Notes (Signed)
  Subjective:    Patient ID: Wendy Duffy, female    DOB: 1975-09-07, 35 y.o.   MRN: [id number]  HPI Here for work in visit:  Concerned, had dizziness for 20 minutes yesterday, then had headache which lasted for two hours.  Character similar to times when she previous had a sinus infection per patient.  Took hydrocodone with full relief.  Now resolved but has residual frontal pressure.  Took blood pressure yesterday and it was fine. Notes both legs were a "little tingly" for a short period of time.  Felt very scared.  Denies palpitations, bur ROS positive for stress around the holidays.   Did not try taking her propranolol with this episode.  2-3 weeks ago had abdominal pain that feels like "prickly sticking"  Took pepto-bismol off and on for a week with little improvement.  No nausea or diarrhea.  No bloating.  No food triggers- but worse when not eating, better with crackers.  Self resolved.  Overall is very worried about symptoms.  Also concerned ears are itching.  Review of Systems Gen:  No fever, chills, unexplained weight loss Nose:  No rhinorrhea, congestion Throat:  No sore throat or dysphagia CV:  No chest pain, palpitations, PND, dyspnea on exertion, or edema Resp: No cough, dyspnea Abd: No nausea, vomting, diarrhea, constipation Neuro:  No headache, numbness, weakness, tingling, syncope.      Objective:   Physical Exam GEN: Alert & Oriented, No acute distress HEENT: [place]/AT. EOMI, PERRLA, no conjunctival injection or scleral icterus.  Bilateral tympanic membranes intact without erythema or effusion.  .  Nares without edema or rhinorrhea.  Oropharynx is without erythema or exudates.  No anterior or posterior cervical lymphadenopathy. Fundus benign CV:  Regular Rate & Rhythm, no murmur Respiratory:  Normal work of breathing, CTAB Abd:  + BS, soft, no tenderness to palpation Ext: no pre-tibial edema Neuro: CN 2-12 grossly intact.  Strength 5/5.  Exam nonfocal.          Assessment & Plan:

## 2011-03-13 ENCOUNTER — Ambulatory Visit: Payer: BC Managed Care – HMO | Admitting: Family Medicine

## 2011-03-18 ENCOUNTER — Telehealth: Payer: Self-pay | Admitting: Family Medicine

## 2011-03-18 NOTE — Telephone Encounter (Signed)
Ms. Wendy Duffy is asking for another note for being out on Friday 12/28.  If you can fax it to [phone]

## 2011-03-18 NOTE — Telephone Encounter (Signed)
Letter placed in fax pile.

## 2011-05-04 ENCOUNTER — Other Ambulatory Visit: Payer: Self-pay | Admitting: Family Medicine

## 2011-05-04 NOTE — Telephone Encounter (Signed)
Refill request

## 2011-05-05 NOTE — Telephone Encounter (Signed)
Needs appointment by May.

## 2011-06-12 ENCOUNTER — Other Ambulatory Visit: Payer: Self-pay | Admitting: Family Medicine

## 2011-07-14 ENCOUNTER — Ambulatory Visit (INDEPENDENT_AMBULATORY_CARE_PROVIDER_SITE_OTHER): Payer: BC Managed Care – HMO | Admitting: Family Medicine

## 2011-07-14 ENCOUNTER — Encounter: Payer: Self-pay | Admitting: Family Medicine

## 2011-07-14 VITALS — BP 116/80 | HR 88 | Temp 98.3°F | Ht 61.0 in | Wt 180.5 lb

## 2011-07-14 DIAGNOSIS — J069 Acute upper respiratory infection, unspecified: Secondary | ICD-10-CM

## 2011-07-14 NOTE — Patient Instructions (Signed)

## 2011-07-14 NOTE — Assessment & Plan Note (Signed)
Viral uri, supportive care, follow-up.

## 2011-07-14 NOTE — Progress Notes (Signed)
  Subjective:    Patient ID: Wendy Duffy, female    DOB: 1975-03-29, 36 y.o.   MRN: [id number]  HPIDiscussed and examined patient with MS3,  36 yo here for in appt  5 days of sinus congestion  Taking zyrtec for allergy medications.  Congestion, headache, cough with chills and night sweats last night. Tmax 99.8.  Improved with tylenol, mucin ex, ibuprofen.  I have reviewed patient's  PMH, FH, and Social history and Medications as related to this visit. Nonsmoker  Axillary tenderness:  Years of intermittent left chest wall pain.  Cyclical with periods.  No lump, no changing.  Review of Systems No dyspnea, chest pain.     Objective:   Physical Exam GEN: Alert & Oriented, No acute distress HEENT: [place]/AT. EOMI, PERRLA, no conjunctival injection or scleral icterus.  Bilateral tympanic membranes intact without erythema or effusion.  .  Nares without edema or rhinorrhea.  Oropharynx is without erythema or exudates.  No anterior or posterior cervical lymphadenopathy. CV:  Regular Rate & Rhythm, no murmur Respiratory:  Normal work of breathing, CTAB TTP on left chest wall under axilla.  No masses.       Assessment & Plan:

## 2011-07-14 NOTE — Progress Notes (Addendum)
Subjective:     Patient ID: Wendy Duffy, female   DOB: 02-10-1976, 36 y.o.   MRN: [id number]  HPI Coming in for cc of sinus congestion. She had itchy eyes, runny nose on Friday, treated with zyrtec. Felt well over the weekend. Yesterday, began having congestion, HA, some body aches, and dry cough. She had chills and night sweats last night. Her maximum temperature recorded was 99.8. Her appetite has been decreased, though tolerating liquids well.   Most bothersome symptoms is grogginess/nasal congestion. Tried mucinex, tylenol x2, and ibuprofen x2 yesterday with adequate relief of pain.  No abdominal pain, n/v/d, sick contacts at home. No sob, cp.  2. On and off pain under armpit for past year. Usually notices it more during her menstrual cycle. Only notices when she is relaxed/prior to sleep. She hasn't noticed any lumps. Otherwise is asymptomatic. No fhx of breast cancer.   Review of Systems     Objective:   Physical Exam Gen: very pleasant. Drowsy appearing HEENT: ncat, perrl, TMs clear, Oropharynx without erythema, bilateral nares extremely erythematous/edematous. CV: RRR Lungs: CTAB, no increased WOB, no w/c Abdomen: Soft, non-tender MSK: tender to deep palpation about 3 cm below left axilla. No palpable masses.    Assessment:         Plan:     1. URI: Ms. Alfonse Ras likely has a viral URI given 1 day hx of congestion, HA, myalgias. No fevers or SOB. HA and body aches are controlled with NSAIDs. Encouraged use of neti pot, afrin, or nasal saline spray for congestion. Will f/u if symptoms progress >1 wk or condition worsens. 2. L axillary pain is not concerning for breast pathology. Varies with menstrual cycle, which is physiological. No fhx of breast cancer. MSK origin is more likely, which is reassuring to patient.

## 2011-07-15 ENCOUNTER — Telehealth: Payer: Self-pay | Admitting: Family Medicine

## 2011-07-15 ENCOUNTER — Telehealth: Payer: Self-pay | Admitting: *Deleted

## 2011-07-15 ENCOUNTER — Encounter: Payer: Self-pay | Admitting: *Deleted

## 2011-07-15 NOTE — Telephone Encounter (Signed)
Pt stated that she has been experiencing chills off and on and her cough has gotten worse. She is alternating between tylenol and motrin and drinking plenty of water.   Forwarded to Dr. Earnest Bailey.Loralee Pacas [place]

## 2011-07-15 NOTE — Telephone Encounter (Signed)
Routed to Dr. Earnest Bailey for f/u.Wendy Duffy

## 2011-07-15 NOTE — Telephone Encounter (Signed)
Ok to fax note to work to be out.

## 2011-07-15 NOTE — Telephone Encounter (Signed)
Stayed out of work again today and needs a note faxed to her work Attn: Insurance account manager  - f# [phone]

## 2011-08-27 ENCOUNTER — Encounter: Payer: Self-pay | Admitting: Family Medicine

## 2011-08-27 ENCOUNTER — Telehealth: Payer: Self-pay | Admitting: *Deleted

## 2011-08-27 ENCOUNTER — Ambulatory Visit (INDEPENDENT_AMBULATORY_CARE_PROVIDER_SITE_OTHER): Payer: BC Managed Care – HMO | Admitting: Family Medicine

## 2011-08-27 VITALS — BP 124/85 | HR 90 | Ht 61.0 in | Wt 177.0 lb

## 2011-08-27 DIAGNOSIS — K5900 Constipation, unspecified: Secondary | ICD-10-CM | POA: Insufficient documentation

## 2011-08-27 DIAGNOSIS — K645 Perianal venous thrombosis: Secondary | ICD-10-CM

## 2011-08-27 MED ORDER — POLYETHYLENE GLYCOL 3350 17 GM/SCOOP PO POWD: 17.0000 g | Freq: Two times a day (BID) | ORAL | Status: AC

## 2011-08-27 MED ORDER — DOCUSATE SODIUM 100 MG PO CAPS: 100.0000 mg | ORAL_CAPSULE | Freq: Two times a day (BID) | ORAL | Status: AC

## 2011-08-27 NOTE — Telephone Encounter (Signed)
Patient calls stating an hour ago she felt a blood clot pass and checked and her gauge was soaked. She has been lying down and I asked her to go check now. States gauze is half soaked and when she disturbed it she did have dribbling in the toilet. Consulted with Dr. [place] and she advises that some bleeding can be expected and as long as just some dribbling not uncommon.  But if she continues to have gauze pads soaking every hour will need to be seen . If our office is closed she is instructed to go to Urgent Care. Has appointment to return tomorrow.

## 2011-08-27 NOTE — Patient Instructions (Addendum)
Please return on Friday so we can make sure you are doing ok Put the gauze pad with some pressure in that area Replace when you need to use the restroom Use the miralax and colace daily for the goal of a soft BM daily

## 2011-08-28 ENCOUNTER — Telehealth: Payer: Self-pay | Admitting: Family Medicine

## 2011-08-28 ENCOUNTER — Encounter: Payer: Self-pay | Admitting: Family Medicine

## 2011-08-28 ENCOUNTER — Ambulatory Visit (INDEPENDENT_AMBULATORY_CARE_PROVIDER_SITE_OTHER): Payer: BC Managed Care – HMO | Admitting: Family Medicine

## 2011-08-28 VITALS — BP 129/86 | HR 90 | Temp 98.2°F | Ht 61.0 in | Wt 174.9 lb

## 2011-08-28 DIAGNOSIS — K645 Perianal venous thrombosis: Secondary | ICD-10-CM

## 2011-08-28 NOTE — Assessment & Plan Note (Signed)
Improved today. Bleeding has stopped. Continue sitz baths. Start bowel regimen.

## 2011-08-28 NOTE — Telephone Encounter (Signed)
Needs a work note stating that she was here and if she needs to be out  - pls fax to [phone] attn: Management

## 2011-08-28 NOTE — Progress Notes (Signed)
  Subjective:    Patient ID: Wendy Duffy, female    DOB: Aug 08, 1975, 36 y.o.   MRN: [id number]  HPI  Patient presents with painful, bleeding hemorrhoid times one day. She says that her last bowel movement was 4 days ago. She is to have bowel movements every other day but now has them once or twice a week. They tend to be very hard. She has had rectal pain as well as bleeding since this morning. She's been using hemorrhoid cream as well as hemorrhoid wipes. She has had hemorrhoids in the past they usually resolve on their own.  Review of Systems Denies CP, SOB, HA, N/V/D, fever     Objective:   Physical Exam  Vital signs reviewed General appearance - alert, well appearing, and in no distress Rectal-there is an enlarged, external hemorrhoid that is bleeding. It is very tender.  Patient was consented for incision of hemorrhage. The area was cleaned with alcohol and then 2 cc of 1% lidocaine without epi were injected. An 11 blade was used to make a 1 cm incision. The clot was removed with hemostat. Hemostats were used to break up any septations and all clot was removed. A pressure dressing was applied.      Assessment & Plan:

## 2011-08-28 NOTE — Progress Notes (Signed)
  Subjective:    Patient ID: Wendy Duffy, female    DOB: 12/27/1975, 36 y.o.   MRN: [id number]  HPI Patient presents for followup of hemorrhoids. She did be sitz bath yesterday but did not pick up the medicine for constipation. She is feeling better but is still very sore. She's not had any fevers she has not had a bowel movement   Review of Systems Denies CP, SOB, HA, N/V/D, fever     Objective:   Physical Exam  Vital signs reviewed General appearance - alert, well appearing, and in no distress Rectal-the hemorrhoid is still swollen but there does not appear to be any clot in it. It is less tender than yesterday. There is no evidence of infection.      Assessment & Plan:

## 2011-08-28 NOTE — Assessment & Plan Note (Signed)
Incision made in hemorrhoid and clot removed. Pressure dressing applied. Sitz bath advised. See back tomorrow for recheck.

## 2011-08-28 NOTE — Telephone Encounter (Signed)
Letter printed and faxed to number provided.

## 2011-08-31 ENCOUNTER — Encounter: Payer: Self-pay | Admitting: *Deleted

## 2011-08-31 ENCOUNTER — Encounter: Payer: Self-pay | Admitting: Family Medicine

## 2011-08-31 ENCOUNTER — Ambulatory Visit (INDEPENDENT_AMBULATORY_CARE_PROVIDER_SITE_OTHER): Payer: BC Managed Care – HMO | Admitting: Family Medicine

## 2011-08-31 VITALS — BP 131/93 | HR 80 | Ht 61.0 in | Wt 177.0 lb

## 2011-08-31 DIAGNOSIS — K645 Perianal venous thrombosis: Secondary | ICD-10-CM

## 2011-08-31 MED ORDER — LIDOCAINE HCL 2 % EX GEL: CUTANEOUS | Status: DC | PRN

## 2011-08-31 NOTE — Progress Notes (Signed)
  Subjective:    Patient ID: Wendy Duffy, female    DOB: [birth year], 36 y.o.   MRN: [id number]  HPI Follow-up: external hemorrhoid pain.  She was last seen here for this problem Friday 06/14. The pain was okay over the weekend. She has been using hemorrhoid cream, witch hazel/steroid pads. However, she had a bowel movement yesterday (normal, not hard or soft) after starting Miralax a few days ago and since then, she has been having increased pain.  She denies seeing any more rectal bleeding. She is on her menstrual period.  She has had hemorrhoids ever since having her last child a while ago. But they have not caused significant problems until last week.  Review of Systems Per HPI.  Past Medical History, Family History, Social History, Allergies, and Medications reviewed.    Objective:   Physical Exam GEN: moderate distress PSYCH: engaged, appropriate, pleasant, not anxious or depressed appearing, normal affect Rectal:    Thrombosed external hemorrhoid, significant tenderness, no anal fissure, area where thrombosed hemorrhoid was excised last week is closed and intact    Assessment & Plan:

## 2011-08-31 NOTE — Assessment & Plan Note (Signed)
Persistent. Thrombosis seems to have re-accumulated after thrombectomy last week 06/13. Will refer to surgery clinic for possible excision of external hemorrhoid. Scheduled for tomorrow afternoon.   Continue sitz baths, hydrocortisone cream/witch hazel pads, OTC analgesics. Start lidocaine jelly as needed for pain. Encouraged regular bowel regimen, titrating Miralax as needed.

## 2011-08-31 NOTE — Patient Instructions (Addendum)
For the external hemorrhoid: -We will refer you to surgery to have it excised.  -Try the lidocaine jelly on the hemorrhoid to help with the pain. -You may take Tylenol 650 mg every 8 hours or ibuprofen 800 mg every 8 hours as needed for pain.  -Continue the steroid cream/pads.  -It is important to keep your stools soft. Continue the Miralax (you may take up to 2 scoops, 34 g, twice a day as needed), drink plenty of water, and try to stay as active as you can.

## 2011-09-01 ENCOUNTER — Encounter (INDEPENDENT_AMBULATORY_CARE_PROVIDER_SITE_OTHER): Payer: Self-pay | Admitting: General Surgery

## 2011-09-01 ENCOUNTER — Ambulatory Visit (INDEPENDENT_AMBULATORY_CARE_PROVIDER_SITE_OTHER): Payer: BC Managed Care – HMO | Admitting: General Surgery

## 2011-09-01 VITALS — BP 134/84 | HR 80 | Temp 97.6°F | Resp 18 | Ht 61.0 in | Wt 178.4 lb

## 2011-09-01 DIAGNOSIS — K645 Perianal venous thrombosis: Secondary | ICD-10-CM

## 2011-09-01 NOTE — Patient Instructions (Signed)
Follow the instructions we talked about such as the warm water tub soaks  GETTING TO GOOD BOWEL HEALTH. Irregular bowel habits such as constipation and diarrhea can lead to many problems over time.  Having one soft bowel movement a day is the most important way to prevent further problems.  The anorectal canal is designed to handle stretching and feces to safely manage our ability to get rid of solid waste (feces, poop, stool) out of our body.  BUT, hard constipated stools can act like ripping concrete bricks and diarrhea can be a burning fire to this very sensitive area of our body, causing inflamed hemorrhoids, anal fissures, increasing risk is perirectal abscesses, abdominal pain/bloating, an making irritable bowel worse.     The goal: ONE SOFT BOWEL MOVEMENT A DAY!  To have soft, regular bowel movements:    Drink at least 8 tall glasses of water a day.     Take plenty of fiber.  Fiber is the undigested part of plant food that passes into the colon, acting s "natures broom" to encourage bowel motility and movement.  Fiber can absorb and hold large amounts of water. This results in a larger, bulkier stool, which is soft and easier to pass. Work gradually over several weeks up to 6 servings a day of fiber (25g a day even more if needed) in the form of: o Vegetables -- Root (potatoes, carrots, turnips), leafy green (lettuce, salad greens, celery, spinach), or cooked high residue (cabbage, broccoli, etc) o Fruit -- Fresh (unpeeled skin & pulp), Dried (prunes, apricots, cherries, etc ),  or stewed ( applesauce)  o Whole grain breads, pasta, etc (whole wheat)  o Bran cereals    Bulking Agents -- This type of water-retaining fiber generally is easily obtained each day by one of the following:  o Psyllium bran -- The psyllium plant is remarkable because its ground seeds can retain so much water. This product is available as Metamucil, Konsyl, Effersyllium, Per Diem Fiber, or the less expensive generic  preparation in drug and health food stores. Although labeled a laxative, it really is not a laxative.  o Methylcellulose -- This is another fiber derived from wood which also retains water. It is available as Citrucel. o Benefiber o Polyethylene Glycol - and "artificial" fiber commonly called Miralax or Glycolax.  It is helpful for people with gassy or bloated feelings with regular fiber o Flax Seed - a less gassy fiber than psyllium   No reading or other relaxing activity while on the toilet. If bowel movements take longer than 5 minutes, you are too constipated   AVOID CONSTIPATION.  High fiber and water intake usually takes care of this.  Sometimes a laxative is needed to stimulate more frequent bowel movements, but    Laxatives are not a good long-term solution as it can wear the colon out. o Osmotics (Milk of Magnesia, Fleets phosphosoda, Magnesium citrate, MiraLax, GoLytely) are safer than  o Stimulants (Senokot, Castor Oil, Dulcolax, Ex Lax)    o Do not take laxatives for more than 7days in a row.    IF SEVERELY CONSTIPATED, try a Bowel Retraining Program: o Do not use laxatives.  o Eat a diet high in roughage, such as bran cereals and leafy vegetables.  o Drink six (6) ounces of prune or apricot juice each morning.  o Eat two (2) large servings of stewed fruit each day.  o Take one (1) heaping tablespoon of a psyllium-based bulking agent twice a day. Use  sugar-free sweetener when possible to avoid excessive calories.  o Eat a normal breakfast.  o Set aside 15 minutes after breakfast to sit on the toilet, but do not strain to have a bowel movement.  o If you do not have a bowel movement by the third day, use an enema and repeat the above steps.  o

## 2011-09-01 NOTE — Progress Notes (Signed)
Patient ID: Wendy Duffy, female   DOB: 30-Jul-1975, 36 y.o.   MRN: [id number]  Chief Complaint  Patient presents with  . Hemorrhoids    urge- thromb hems    HPI Wendy Duffy is a 36 y.o. female.   HPI  35 year old [place] American female referred by Dr. Willaim Bane for evaluation of a thrombosed external hemorrhoid. The patient was seen at the family medicine Center on June 13 and the thrombosed left-sided external hemorrhoid was incised. She has seen him twice in followup. They thought the clot had reaccumulated yesterday and she was sent here for second opinion. She states that she generally has a bowel movement every other day. Her last bowel movement was Sunday. She normally drinks 8-10 glasses of water per day. She sits on the commode for 10-15 minutes at a time. She does strain. She denies any incontinence. They have put her on MiraLax 2 days ago. She has also been using Preparation H. She reports minimal discomfort today. Occasionally it'll be worse in the evening. She denies any severe pain with defecation. She denies any blood per rectum. Past Medical History  Diagnosis Date  . Hypertension   . Hemorrhoids     Past Surgical History  Procedure Date  . Cesarean section 1998, 2003  . Wisdom tooth extraction     History reviewed. No pertinent family history.  Social History History  Substance Use Topics  . Smoking status: Never Smoker   . Smokeless tobacco: Not on file  . Alcohol Use: Yes     occ    No Known Allergies  Current Outpatient Prescriptions  Medication Sig Dispense Refill  . amLODipine (NORVASC) 10 MG tablet take 1 tablet by mouth once daily  30 tablet  3  . docusate sodium (COLACE) 100 MG capsule Take 1 capsule (100 mg total) by mouth 2 (two) times daily.  60 capsule  11  . hydrochlorothiazide (HYDRODIURIL) 25 MG tablet take 1 tablet by mouth once daily  90 tablet  0  . lidocaine (XYLOCAINE JELLY) 2 % jelly Apply topically as needed.  30 mL  0  . losartan (COZAAR)  25 MG tablet take 1 tablet by mouth once daily  30 tablet  3  . polyethylene glycol powder (GLYCOLAX/MIRALAX) powder       . propranolol (INDERAL) 20 MG tablet Take 20 mg by mouth 2 (two) times daily as needed.        Review of Systems Review of Systems  All other systems reviewed and are negative.    Blood pressure 134/84, pulse 80, temperature 97.6 F (36.4 C), temperature source Temporal, resp. rate 18, height 5\' 1"  (1.549 m), weight 178 lb 6.4 oz (80.922 kg).  Physical Exam Physical Exam  Vitals reviewed. Constitutional: She appears well-developed and well-nourished. No distress.  HENT:  Head: Normocephalic and atraumatic.  Right Ear: External ear normal.  Left Ear: External ear normal.  Eyes: Conjunctivae are normal.  Neck: No tracheal deviation present.  Pulmonary/Chest: Effort normal. No stridor. No respiratory distress.  Abdominal: Soft. There is no tenderness.  Genitourinary:          Large left sided thrombosed ext hemorrhoid. Incision over hemorrhoid. DRE deferred. Hemorrhoid appears swollen but clot appears evacuated. Min TTP. No cellulitis, fluctuance.   Musculoskeletal: She exhibits no edema.  Neurological: She is alert. She exhibits normal muscle tone.  Skin: Skin is warm and dry. She is not diaphoretic.  Psychiatric: She has a normal mood and affect. Her behavior is  normal. Judgment and thought content normal.    Data Reviewed Family medicine notes  Assessment    Left thrombosed external hemorrhoid    Plan    We discussed the etiology of hemorrhoids. The patient was given educational material as well as diagrams. We discussed nonoperative and operative management of hemorrhoidal disease.  We discussed the importance of having a daily soft bowel movement and avoiding constipation. We also discussed good bowel habits such as not reading in the bathroom, not straining, and drinking 6-8 glasses of water per day. We also discussed the importance of a high  fiber diet. We discussed foods that were high in fiber as well as fiber supplements. We discussed the importance of trying to get 25-30 g of fiber per day in their diet. We discussed the need to start with a low dose of fiber and then gradually increasing their daily fiber dose over several weeks in order to avoid bloating and cramping.  PLAN: Since her pain is getting better, will continue non-op management for now. Can continue with prep H. Stressed importance of high fiber diet to get her regular and avoiding sitting on the commode for longer than 10 minutes. Cont miralax for now until pt gets up to roughly 25 grams of fiber/day. F/u 6 weeks.   Mary Sella. Andrey Campanile, MD, FACS General, Bariatric, & Minimally Invasive Surgery [facility], [place]        [facility] M 09/01/2011, 4:26 PM

## 2011-09-23 ENCOUNTER — Telehealth: Payer: Self-pay | Admitting: Family Medicine

## 2011-09-23 NOTE — Telephone Encounter (Signed)
Needs a note to return to work - fax to [employer] [phone]

## 2011-10-21 ENCOUNTER — Encounter (INDEPENDENT_AMBULATORY_CARE_PROVIDER_SITE_OTHER): Payer: BC Managed Care – HMO | Admitting: General Surgery

## 2011-12-07 ENCOUNTER — Ambulatory Visit: Payer: BC Managed Care – HMO | Admitting: Family Medicine

## 2011-12-08 ENCOUNTER — Other Ambulatory Visit: Payer: Self-pay | Admitting: Family Medicine

## 2011-12-16 ENCOUNTER — Encounter: Payer: Self-pay | Admitting: Family Medicine

## 2011-12-16 ENCOUNTER — Ambulatory Visit (INDEPENDENT_AMBULATORY_CARE_PROVIDER_SITE_OTHER): Payer: BC Managed Care – HMO | Admitting: Family Medicine

## 2011-12-16 ENCOUNTER — Telehealth: Payer: Self-pay | Admitting: Family Medicine

## 2011-12-16 VITALS — BP 137/96 | HR 72 | Temp 98.5°F | Ht 61.0 in | Wt 182.0 lb

## 2011-12-16 DIAGNOSIS — K3189 Other diseases of stomach and duodenum: Secondary | ICD-10-CM

## 2011-12-16 DIAGNOSIS — F5089 Other specified eating disorder: Secondary | ICD-10-CM | POA: Insufficient documentation

## 2011-12-16 DIAGNOSIS — R1013 Epigastric pain: Secondary | ICD-10-CM

## 2011-12-16 DIAGNOSIS — R109 Unspecified abdominal pain: Secondary | ICD-10-CM

## 2011-12-16 DIAGNOSIS — D649 Anemia, unspecified: Secondary | ICD-10-CM

## 2011-12-16 DIAGNOSIS — F5083 Pica in adults: Secondary | ICD-10-CM | POA: Insufficient documentation

## 2011-12-16 LAB — CBC
MCHC: 31.3 g/dL (ref 30.0–36.0)
Platelets: 357 10*3/uL (ref 150–400)
RDW: 15.2 % (ref 11.5–15.5)

## 2011-12-16 LAB — POCT H PYLORI SCREEN: H Pylori Screen, POC: NEGATIVE

## 2011-12-16 MED ORDER — OMEPRAZOLE 40 MG PO CPDR: 40.0000 mg | DELAYED_RELEASE_CAPSULE | Freq: Every day | ORAL | Status: DC

## 2011-12-16 NOTE — Assessment & Plan Note (Addendum)
With history of abdominal pain and pica, could represent blood loss and/or Fe deficiency.  Check CBC.   Update:   Lab Results  Component Value Date   HGB 10.4* 12/16/2011  Will have patient come in for iron panel.

## 2011-12-16 NOTE — Assessment & Plan Note (Signed)
Pain is epigastric likely gastritis vs PUD vs GERD.  Will change over to PPI from H2 blocker.  She may have some slow blood loss as well given symptom of pica.  Advised to avoid NSAIDS for now, will check H. Pylori.  If not improvement may need to send for EGD.

## 2011-12-16 NOTE — Progress Notes (Signed)
  Subjective:    Patient ID: Wendy Duffy, female    DOB: 1975-06-05, 36 y.o.   MRN: [id number]  HPI  1. Abdominal pain:  Here with complaint of abdominal pain x3 months.  Pain located in epigastric area. Does not seem to be associated with any particular think including food intake.  She was taking pepcid and this initially improved but it has returned.    She does endorse some nausea without vomiting.  She has not noticed any bloody or dark stools.  She does use nsaids fairly often.  Has never tried PPI.  2. Pica:  Reports that she often craves and eats plain corn starch.  As above has not noticed any blood in her stool, dark stool and denies heavy periods.  Most recent LMP was last week.    Review of Systems Per HPI    Objective:   Physical Exam  Constitutional: She appears well-nourished. No distress.  HENT:  Head: Normocephalic and atraumatic.  Eyes:       No conjunctival pallor.   Neck: Neck supple.  Cardiovascular: Normal rate, regular rhythm and normal heart sounds.   Pulmonary/Chest: Effort normal.  Abdominal: Soft. Bowel sounds are normal. She exhibits no distension. There is tenderness (epigastric tenderness). There is no rebound and no guarding.  Musculoskeletal: She exhibits no edema.  Neurological: She is alert.          Assessment & Plan:

## 2011-12-16 NOTE — Telephone Encounter (Signed)
Called pt. LMVM. See Dr.Matthew's message. .Wendy Duffy  

## 2011-12-16 NOTE — Telephone Encounter (Signed)
Please let patient know antibody for H. Pylori is negative.  Have her avoid medications including ibuprofen, aleve, goody/bc powders.  Start medication that i prescribed today.  Return in one month.

## 2011-12-16 NOTE — Patient Instructions (Addendum)
Gastritis, Adult Gastritis is soreness and swelling (inflammation) of the lining of the stomach. Gastritis can develop as a sudden onset (acute) or long-term (chronic) condition. If gastritis is not treated, it can lead to stomach bleeding and ulcers. CAUSES  Gastritis occurs when the stomach lining is weak or damaged. Digestive juices from the stomach then inflame the weakened stomach lining. The stomach lining may be weak or damaged due to viral or bacterial infections. One common bacterial infection is the Helicobacter pylori infection. Gastritis can also result from excessive alcohol consumption, taking certain medicines, or having too much acid in the stomach.  SYMPTOMS  In some cases, there are no symptoms. When symptoms are present, they may include:  Pain or a burning sensation in the upper abdomen.  Nausea.  Vomiting.  An uncomfortable feeling of fullness after eating. DIAGNOSIS  Your caregiver may suspect you have gastritis based on your symptoms and a physical exam. To determine the cause of your gastritis, your caregiver may perform the following:  Blood or stool tests to check for the H pylori bacterium.  Gastroscopy. A thin, flexible tube (endoscope) is passed down the esophagus and into the stomach. The endoscope has a light and camera on the end. Your caregiver uses the endoscope to view the inside of the stomach.  Taking a tissue sample (biopsy) from the stomach to examine under a microscope. TREATMENT  Depending on the cause of your gastritis, medicines may be prescribed. If you have a bacterial infection, such as an H pylori infection, antibiotics may be given. If your gastritis is caused by too much acid in the stomach, H2 blockers or antacids may be given. Your caregiver may recommend that you stop taking aspirin, ibuprofen, or other nonsteroidal anti-inflammatory drugs (NSAIDs). HOME CARE INSTRUCTIONS  Only take over-the-counter or prescription medicines as directed by  your caregiver.  If you were given antibiotic medicines, take them as directed. Finish them even if you start to feel better.  Drink enough fluids to keep your urine clear or pale yellow.  Avoid foods and drinks that make your symptoms worse, such as:  Caffeine or alcoholic drinks.  Chocolate.  Peppermint or mint flavorings.  Garlic and onions.  Spicy foods.  Citrus fruits, such as oranges, lemons, or limes.  Tomato-based foods such as sauce, chili, salsa, and pizza.  Fried and fatty foods.  Eat small, frequent meals instead of large meals. SEEK IMMEDIATE MEDICAL CARE IF:   You have black or dark red stools.  You vomit blood or material that looks like coffee grounds.  You are unable to keep fluids down.  Your abdominal pain gets worse.  You have a fever.  You do not feel better after 1 week.  You have any other questions or concerns. MAKE SURE YOU:  Understand these instructions.  Will watch your condition.  Will get help right away if you are not doing well or get worse. Document Released: 02/24/2001 Document Revised: 09/01/2011 Document Reviewed: 04/15/2011 [employer] Patient Information 2013 [employer].  

## 2011-12-17 ENCOUNTER — Encounter: Payer: Self-pay | Admitting: Family Medicine

## 2011-12-17 NOTE — Telephone Encounter (Signed)
Called patient she says her abdominal pain is a little better but she now has a headache. I asked her if she feels like she needs to be seen today but she wants to wait another day to see if she is better. Also patient is requesting a work note which I told her I will leave at front desk for her to pick up. She will need to be seen back in office if she is not any better and needs additional days off.Busick, Rodena Medin

## 2011-12-17 NOTE — Telephone Encounter (Signed)
Pt called back and message was given - she is asking for a note for work for yesterday and today (still not feeling well) pls advise

## 2011-12-21 ENCOUNTER — Ambulatory Visit: Payer: BC Managed Care – HMO | Admitting: Family Medicine

## 2012-01-01 ENCOUNTER — Telehealth: Payer: Self-pay | Admitting: Family Medicine

## 2012-01-01 MED ORDER — FLUCONAZOLE 150 MG PO TABS: 150.0000 mg | ORAL_TABLET | Freq: Once | ORAL | Status: DC

## 2012-01-01 NOTE — Telephone Encounter (Signed)
Returned call to patient.  C/o "clunky white discharge" with itching and irritation.  Knows it is a yeast infection.  Used OTC cream yesterday to help with irritation.  Patient unable to come in today.  Wants Rx for "pill" to take for sxs.  States she is off on Monday (03/05/12) and will come in for eval if not better.  Will route request to Dr. Madolyn Frieze and call patient back.  Gaylene Brooks, RN

## 2012-01-01 NOTE — Telephone Encounter (Signed)
LVM. Rx Diflucan x 1 called in. Advised to come in for visit if symptoms do not improve with treatment.

## 2012-01-01 NOTE — Telephone Encounter (Signed)
Pt is asking for fluconazole for a yeast inf - she doesn't want to have to pay $20 for copay just for yeast inf.Marland KitchenMarland Kitchen

## 2012-01-04 NOTE — Addendum Note (Signed)
Addended by: Everrett Coombe on: 01/04/2012 02:07 AM   Modules accepted: Orders

## 2012-01-05 ENCOUNTER — Telehealth: Payer: Self-pay | Admitting: Family Medicine

## 2012-01-05 NOTE — Telephone Encounter (Signed)
LMOM advising pt to rt call. 

## 2012-01-06 ENCOUNTER — Telehealth: Payer: Self-pay | Admitting: *Deleted

## 2012-01-06 NOTE — Telephone Encounter (Signed)
Left message for patient to return call.Busick, Robert Lee  

## 2012-01-06 NOTE — Telephone Encounter (Signed)
Message copied by Jennette Bill on Wed Jan 06, 2012  2:24 PM ------      Message from: Everrett Coombe      Created: Mon Jan 04, 2012  2:04 AM       Hgb low, possible Fe deficiency.  Orders placed for iron panel, please have patient come in at her convenience.  Possibly 2/2 to gastritis.

## 2012-01-11 NOTE — Telephone Encounter (Signed)
We have called this pt several times and she has not called back. Does not have upcoming appt scheduled. Letter mailed to pt to schedule lab appointment. Lorenda Hatchet, Renato Battles

## 2012-01-18 ENCOUNTER — Ambulatory Visit: Payer: BC Managed Care – HMO | Admitting: Family Medicine

## 2012-01-28 ENCOUNTER — Other Ambulatory Visit: Payer: Self-pay | Admitting: Family Medicine

## 2012-02-01 ENCOUNTER — Other Ambulatory Visit: Payer: BC Managed Care – HMO

## 2012-02-01 DIAGNOSIS — D649 Anemia, unspecified: Secondary | ICD-10-CM

## 2012-02-01 DIAGNOSIS — F5089 Other specified eating disorder: Secondary | ICD-10-CM

## 2012-02-01 NOTE — Progress Notes (Signed)
ANEMIA PANEL DONE TODAY Wendy Duffy 

## 2012-02-03 LAB — ANEMIA PANEL
Folate: 8.6 ng/mL (ref >5.4)
RBC.: 4.84 MIL/uL (ref 3.87–5.11)
TIBC: 409 ug/dL (ref 250–470)
UIBC: 369 ug/dL (ref 125–400)

## 2012-02-05 ENCOUNTER — Encounter: Payer: Self-pay | Admitting: Family Medicine

## 2012-03-01 ENCOUNTER — Telehealth: Payer: Self-pay | Admitting: Family Medicine

## 2012-03-01 NOTE — Telephone Encounter (Signed)
LVM. I am not sure why I am filling out FMLA for patient. I advised her to call and make an appointment to fill form or to call clinic and let me know if she has a problem doing this.

## 2012-03-01 NOTE — Telephone Encounter (Signed)
Patient dropped off FMLA papers to be filled out.  Please fax to [phone] when completed.

## 2012-03-22 ENCOUNTER — Ambulatory Visit (INDEPENDENT_AMBULATORY_CARE_PROVIDER_SITE_OTHER): Payer: BC Managed Care – HMO | Admitting: Family Medicine

## 2012-03-22 VITALS — BP 118/88 | HR 85 | Temp 98.8°F

## 2012-03-22 DIAGNOSIS — G479 Sleep disorder, unspecified: Secondary | ICD-10-CM

## 2012-03-22 MED ORDER — SERTRALINE HCL 50 MG PO TABS: 50.0000 mg | ORAL_TABLET | Freq: Every day | ORAL | Status: DC

## 2012-03-22 MED ORDER — PROPRANOLOL HCL 20 MG PO TABS: 20.0000 mg | ORAL_TABLET | Freq: Two times a day (BID) | ORAL | Status: DC | PRN

## 2012-03-22 NOTE — Progress Notes (Signed)
  Subjective:    Patient ID: Wendy Duffy, female    DOB: 08/27/1975, 37 y.o.   MRN: [id number]  HPI She presents today with difficulties sleeping She has been under a lot of stress lately. She is in a new relationship which she is happy with but her children are now living with their father and she feels guilty; she is trying to arrange for them to live with her  She has had a history of difficulty sleeping and she was on propranolol as needed before to also help with her anxiety. She ran out of this medication  Review of Systems Denies SI/HI, hopelessness, decreased interest/inattention Denies manic behavior Endorses increased irritability d    Objective:   Physical Exam Gen: NAD; well-appearing, -nourished PSYCH: depressed-appearing; engaged and normally conversant, appropriate to questions, alert and oriented     Assessment & Plan:

## 2012-03-22 NOTE — Assessment & Plan Note (Signed)
She is having difficulty staying asleep. She has had a history of difficulty sleeping but it is now worse with new stressors. See HPI for details. She would like to re-start propranolol which has helped with her sleep and anxiety in the past. We also discussed, and she would like to start anti-depressant to see if it will help her irritability and anxiety at this time. Start Zocor. Follow-up in 2-4 weeks.

## 2012-03-22 NOTE — Patient Instructions (Addendum)
Try Zoloft to help with mood  Try propanolol to help with sleep and anxiety  Follow-up in 2-4 weeks   Make an appointment please also to fill out FMLA paperwork

## 2012-03-23 ENCOUNTER — Telehealth: Payer: Self-pay | Admitting: Family Medicine

## 2012-03-23 NOTE — Telephone Encounter (Signed)
Returned call to patient.  Started med yesterday at 4 pm and felt "groggy."  Also, restarted Inderal yesterday.  Had one episode of nausea, headache, and vomiting.  Has not eaten this morning.  Patient has note to return to work today, but does not think she can return to work today.  Will route note to Dr. Madolyn Frieze for advice and authorization to excuse patient from work today.  Will call patient back.  Gaylene Brooks, RN

## 2012-03-23 NOTE — Telephone Encounter (Addendum)
Ok per Dr. Madolyn Frieze to extend work note through today and will fax to employer---Attn:  Jeralyn Bennett at [phone] .  Patient starting to feel better and will try to take Zoloft with food.  Patient not sure if sxs due to taking Inderal and Zoloft together.  Will call back tomorrow if not better.  Gaylene Brooks, RN

## 2012-03-23 NOTE — Telephone Encounter (Signed)
Pt started Zoloft yesterday and today is feeling "yucky" and threw up.  Wants to speak to nurse about what to do.

## 2012-04-06 ENCOUNTER — Ambulatory Visit (INDEPENDENT_AMBULATORY_CARE_PROVIDER_SITE_OTHER): Payer: BC Managed Care – HMO | Admitting: Family Medicine

## 2012-04-06 ENCOUNTER — Encounter: Payer: Self-pay | Admitting: Family Medicine

## 2012-04-06 VITALS — BP 127/83 | HR 85 | Temp 98.7°F | Ht 61.0 in | Wt 184.0 lb

## 2012-04-06 DIAGNOSIS — F329 Major depressive disorder, single episode, unspecified: Secondary | ICD-10-CM

## 2012-04-06 DIAGNOSIS — F32A Depression, unspecified: Secondary | ICD-10-CM | POA: Insufficient documentation

## 2012-04-06 DIAGNOSIS — I517 Cardiomegaly: Secondary | ICD-10-CM

## 2012-04-06 NOTE — Progress Notes (Signed)
  Subjective:    Patient ID: Wendy Duffy, female    DOB: Feb 16, 1976, 37 y.o.   MRN: [id number]  HPI # Follow-up of mood She breaks into tears when I enter room.  She says that she has been under a lot of stress and she is not sure how she can handle her current situation.  Her job and her situation with her children is really causing her distress. Her job is stressful but it pays well and she is afraid of losing it.  Her children are living with their father (her ex-husband) because she and her boyfriend live in a small apartment.  She was watching a movie recently that ended happily, and she felt like she was not deserving of that kind of happiness. Later she heard a voice saying that everyone might be better off if she were not there. She denies plan of suicide.   She took one dose of Zoloft but it made her feel agitated so she stopped.  She takes propranolol intermittently for anxiety but she has taken maybe 2 doses since she picked up the new prescription a few weeks ago.   Review of Systems Denies SI/HI  Allergies, medication, past medical history reviewed.  -HTN    Objective:   Physical Exam GEN/PSYCH: tearful but thoughts are coherent; alert and oriented; appropriate to questions; appears sad but anxious; her affect and response to questions and situation was normal and did not seem flat; she smiled occasionally at appropriate moments  PHQ-9: 25/30, extremely difficult      Assessment & Plan:

## 2012-04-06 NOTE — Patient Instructions (Addendum)
STOP Zoloft  Make a follow-up appointment with me on Monday 01/27 Work excuse note through that time

## 2012-04-06 NOTE — Assessment & Plan Note (Signed)
She seems to be struggling with significant life stressors that is causing depression. We will defer though on starting another medication at this time. Work note given to take a few days off of work for her to recover and to reflect on how to best manage her stressors. We discussed few ideas today. She also sees a therapist; patient finds it helpful to talk to someone who listens and is objective and appreciates her therapist.  Follow-up early next week to re-assess and determine if she may go back to work.

## 2012-04-11 ENCOUNTER — Ambulatory Visit (INDEPENDENT_AMBULATORY_CARE_PROVIDER_SITE_OTHER): Payer: BC Managed Care – HMO | Admitting: Family Medicine

## 2012-04-11 ENCOUNTER — Encounter: Payer: Self-pay | Admitting: Family Medicine

## 2012-04-11 VITALS — BP 116/81 | HR 93 | Temp 98.8°F | Ht 61.0 in | Wt 182.0 lb

## 2012-04-11 DIAGNOSIS — F329 Major depressive disorder, single episode, unspecified: Secondary | ICD-10-CM

## 2012-04-11 DIAGNOSIS — F32A Depression, unspecified: Secondary | ICD-10-CM

## 2012-04-11 MED ORDER — BUPROPION HCL 100 MG PO TABS: 100.0000 mg | ORAL_TABLET | Freq: Two times a day (BID) | ORAL | Status: DC

## 2012-04-11 NOTE — Assessment & Plan Note (Signed)
She appears better today after a break from work, resting at home and re-thinking her life situation for the past few days.  She is still recovering from cold symptoms, so we will request another day off of work and return on 01/29.  We will try a different anti-depressant Wellbutrin. Recommend starting on a Friday or on a day when she will not go to work the next day in case of side effects.  Self-titrate up on this medication if she tolerates. She will call and check-in with me in a week to let me know how she is tolerating medications.  Follow-up in clinic in 2 weeks.  She has an appointment with her therapist later this week.  FLMA paperwork for missing days off during this episode of depression filled today and will be faxed to her work.

## 2012-04-11 NOTE — Patient Instructions (Addendum)
Work note through tomorrow 01/28  Start Wellbutrin on Friday Take 1 tablet twice a day After 3 days, make increase to 1 tablet three times a day Call me next week and let me know how you are doing on this medication   Follow-up in 2 weeks

## 2012-04-11 NOTE — Progress Notes (Signed)
  Subjective:    Patient ID: Wendy Duffy, female    DOB: November 04, 1975, 37 y.o.   MRN: [id number]  HPI Her mood is better. She does not endorse suicidal thoughts. She had cold symptoms the day after her last clinic visit and was sick all weekend. She thinks this contributed to her particularly low mood.  She had an argument with her boyfriend on Saturday. She thinks she may have overreacted to a comment.  She went to church on Sunday and thinks this may have helped. She has an appointment with her therapist this week.  Review of Systems Sometimes she cooks too much food at one time but denies shopping sprees, staying up at night, significant amount of energy, and other manic symptoms  Allergies, medication, past medical history reviewed.  Smoking status noted.  Family history of mood disorders unknown.  She did not know her parents but her grandmother struggled with a alcholism    Objective:   Physical Exam GEN: NAD PSYCH: smiles more today; appears less depressed and less tearful; appropriate to questions; pleasant; engaged; normal thought process    Assessment & Plan:

## 2012-04-11 NOTE — Telephone Encounter (Signed)
FMLA forms faxed to [phone].  Wendy Duffy

## 2012-04-13 ENCOUNTER — Telehealth: Payer: Self-pay | Admitting: Family Medicine

## 2012-04-13 NOTE — Telephone Encounter (Signed)
FMLA paperwork placed in MD box.

## 2012-04-13 NOTE — Telephone Encounter (Signed)
FMLA paperwork to be completed by [facility].

## 2012-04-13 NOTE — Telephone Encounter (Signed)
Patient is calling to let Dr. Madolyn Frieze know that the [facility] paperwork is time sensistive.  There is one page with with the post it that she needs asap and the rest she will need to be faxed in by 2/8.

## 2012-04-14 NOTE — Telephone Encounter (Addendum)
We faxed FMLA paperwork that I had filled out with patient during clinic on 01/27.   She has more paperwork that needs to be filled for reduced hours and because she took consecutive days off.  The days she took off from work were 01/22-01/28.

## 2012-04-14 NOTE — Telephone Encounter (Signed)
Forms filled out.  Some of the forms were redundant (for FMLA) so I informed patient about this and she said it would be okay not to fill those out.   Copies of forms filled today will be copied and scanned into [employer].   Forms were given to Lupita Leash to copy and then to place in the front box for patient to pick-up.  DONNA, will you also notify patient that the forms are ready for pick-up and will you ask her if she needs anything faxed (versus her having to pick them up)? Thank you for your help.

## 2012-04-14 NOTE — Telephone Encounter (Signed)
Wendy Duffy was in clinic, so I copied the forms, placed in the scan box, and put the forms up front.   FRONT DESK: 1. Will you call patient and inform forms are ready for pick-up? You may have to call her through her work place.  2. When patient comes to pick-up forms, please ask her if anything needs to be faxed.  Thank you.

## 2012-04-25 ENCOUNTER — Ambulatory Visit (INDEPENDENT_AMBULATORY_CARE_PROVIDER_SITE_OTHER): Payer: BC Managed Care – HMO | Admitting: Family Medicine

## 2012-04-25 ENCOUNTER — Encounter: Payer: Self-pay | Admitting: Family Medicine

## 2012-04-25 ENCOUNTER — Ambulatory Visit: Payer: BC Managed Care – HMO | Admitting: Family Medicine

## 2012-04-25 VITALS — BP 134/84 | HR 81 | Ht 61.0 in | Wt 192.0 lb

## 2012-04-25 DIAGNOSIS — F32A Depression, unspecified: Secondary | ICD-10-CM

## 2012-04-25 DIAGNOSIS — F3289 Other specified depressive episodes: Secondary | ICD-10-CM

## 2012-04-25 DIAGNOSIS — F329 Major depressive disorder, single episode, unspecified: Secondary | ICD-10-CM

## 2012-04-25 MED ORDER — BUPROPION HCL 100 MG PO TABS: ORAL_TABLET | ORAL | Status: DC

## 2012-04-25 NOTE — Assessment & Plan Note (Signed)
Improved on Wellbutrin.  We will increase morning dose as tolerated. Keep evening dose the same and take earlier to see if this helps with her difficulty falling asleep at night.  Call if any problems or concerns; follow-up in 4 weeks.

## 2012-04-25 NOTE — Patient Instructions (Addendum)
For your wellbutrin: Take 1.5 tablets (150 mg) in the AM. After 1 week may increase to 2 tablets in the AM. Continue to take 1 tablet in the PM (maybe take earlier, around 5 pm).   Follow-up in 1 month.

## 2012-04-25 NOTE — Progress Notes (Signed)
  Subjective:    Patient ID: Wendy Duffy, female    DOB: 27-Jul-1975, 37 y.o.   MRN: [id number]  HPI # FMLA paperwork, for taking 01/22-01/28 off due to severe depression We had filled out paperwork previously which was faxed, however, work place fax machine not working at that time  # Depression She reports improvement with Wellbutrin 100 mg bid She is having difficulty falling asleep though. She takes evening dose of Wellbutrin around 8 pm. It takes her about 1.5 hrs before falling asleep.   Review of Systems Per HPI  Allergies, medication, past medical history reviewed.  Smoking status noted.     Objective:   Physical Exam GEN: NAD PSYCH: smiles occasionally; not tearful  PHQ-9: 19/30, very difficult    Assessment & Plan:  FLMA paperwork filled and copied and placed in the scan box.

## 2012-05-02 ENCOUNTER — Ambulatory Visit: Payer: BC Managed Care – HMO | Admitting: Family Medicine

## 2012-05-25 ENCOUNTER — Ambulatory Visit (INDEPENDENT_AMBULATORY_CARE_PROVIDER_SITE_OTHER): Payer: BC Managed Care – HMO | Admitting: Sports Medicine

## 2012-05-25 ENCOUNTER — Encounter: Payer: Self-pay | Admitting: Sports Medicine

## 2012-05-25 DIAGNOSIS — H6690 Otitis media, unspecified, unspecified ear: Secondary | ICD-10-CM

## 2012-05-25 MED ORDER — FLUCONAZOLE 150 MG PO TABS: 150.0000 mg | ORAL_TABLET | Freq: Once | ORAL | Status: DC | PRN

## 2012-05-25 MED ORDER — AMOXICILLIN 500 MG PO CAPS: 500.0000 mg | ORAL_CAPSULE | Freq: Three times a day (TID) | ORAL | Status: DC

## 2012-05-25 NOTE — Progress Notes (Signed)
  [facility]  Patient name: MONIK LINS MRN [id number]  Date of birth: Mar 10, 1976  CC & HPI:  KAELEI WHEELER is a 37 y.o. female presenting today for an acute same day visit for:  # Cough, Congestion & Sore Throat:  Onset: 5 days ago Description: cough, body aches, fevers, chills, facial pressure and congestions Modifying factors:  nothing  Symptoms Productive: non Wheezing: no Dyspnea: no Nasal discharge: yes Fever: yes Sore throat: occasional Sick contacts: non-known History of Asthma: no  Red Flags  Weight loss: no Hemoptysis: no Edema:no    ROS:  See HPI  Pertinent History Reviewed:  Medical & Surgical Hx:  Reviewed: Significant for depression, anxiety, sleep disorder.   Medications: Reviewed & Updated - see associated section Social History: Reviewed -  reports that she has never smoked. She does not have any smokeless tobacco history on file.  Objective Findings:  Vitals: BP 131/90  Pulse 91  Temp(Src) 98.2 F (36.8 C) (Oral)  Ht 5\' 1"  (1.549 m)  Wt 188 lb 3.2 oz (85.367 kg)  BMI 35.58 kg/m2  PE: GENERAL:  Adult AA  female. In no discomfort; no respiratory distress. PSYCH: Alert and appropriately interactive; Insight:Good   H&N: AT/[place], trachea midline, B TM purulent effusions, no cone of light, minimal erythema.  B Cervical lymphadenopathy EENT:  MMM, no scleral icterus, EOMi HEART: RRR, S1/S2 heard, no murmur LUNGS: CTA B, no wheezes, no crackles EXTREMITIES: Moves all 4 extremities spontaneously, warm well perfused, no edema, bilateral DP and PT pulses 2/4.      Assessment & Plan:

## 2012-05-25 NOTE — Patient Instructions (Signed)
It was nice to see you today.   Today we discussed: 1. Otitis media, bilateral I am sending in: - amoxicillin (AMOXIL) 500 MG capsule; Take 1 capsule (500 mg total) by mouth 3 (three) times daily.  Dispense: 21 capsule; Refill: 0 You will want to pick up a probiotic and/or use a good source of dietary probiotics including Activa Yogurt. I have also sent in the diflucan prescription in case you need it.  Do not fill or use this unless you feel that it is needed  Please see Dr. Madolyn Frieze as previously scheduled..  If you need anything prior to seeing me please call the clinic.  Please Bring all medications with you to each appointment.  Otitis Media, Adult A middle ear infection is an infection in the space behind the eardrum. The medical name for this is "otitis media." It may happen after a common cold. It is caused by a germ that starts growing in that space. You may feel swollen glands in your neck on the side of the ear infection. HOME CARE INSTRUCTIONS   Take your medicine as directed until it is gone, even if you feel better after the first few days.  Only take over-the-counter or prescription medicines for pain, discomfort, or fever as directed by your caregiver.  Occasional use of a nasal decongestant a couple times per day may help with discomfort and help the eustachian tube to drain better.  SEEK IMMEDIATE MEDICAL CARE IF:   You are not getting better in 2 to 3 days.  You have pain that is not controlled with medication.  You feel worse instead of better.  You cannot use the medication as directed.   You develop swelling, redness or pain around the ear or stiffness in your neck. MAKE SURE YOU:   Understand these instructions.  Will watch your condition.  Will get help right away if you are not doing well or get worse. Document Released: 12/06/2003 Document Revised: 05/25/2011 Document Reviewed: 10/07/2007 [employer] Patient Information 2013 [place], [place].

## 2012-05-25 NOTE — Assessment & Plan Note (Signed)
Noted purulent effusion bilaterally.  L worse than R Amoxicillin X 7 days See AVS

## 2012-06-03 ENCOUNTER — Other Ambulatory Visit: Payer: Self-pay | Admitting: *Deleted

## 2012-06-03 MED ORDER — OMEPRAZOLE 40 MG PO CPDR: 40.0000 mg | DELAYED_RELEASE_CAPSULE | Freq: Every day | ORAL | Status: DC

## 2012-06-07 ENCOUNTER — Other Ambulatory Visit: Payer: Self-pay | Admitting: Family Medicine

## 2012-06-14 ENCOUNTER — Encounter: Payer: Self-pay | Admitting: Family Medicine

## 2012-06-14 ENCOUNTER — Ambulatory Visit (INDEPENDENT_AMBULATORY_CARE_PROVIDER_SITE_OTHER): Payer: BC Managed Care – HMO | Admitting: Family Medicine

## 2012-06-14 VITALS — BP 121/82 | HR 82 | Temp 98.3°F | Ht 61.0 in | Wt 190.0 lb

## 2012-06-14 DIAGNOSIS — E669 Obesity, unspecified: Secondary | ICD-10-CM

## 2012-06-14 DIAGNOSIS — Z309 Encounter for contraceptive management, unspecified: Secondary | ICD-10-CM

## 2012-06-14 DIAGNOSIS — G479 Sleep disorder, unspecified: Secondary | ICD-10-CM

## 2012-06-14 DIAGNOSIS — F32A Depression, unspecified: Secondary | ICD-10-CM

## 2012-06-14 DIAGNOSIS — F329 Major depressive disorder, single episode, unspecified: Secondary | ICD-10-CM

## 2012-06-14 DIAGNOSIS — IMO0001 Reserved for inherently not codable concepts without codable children: Secondary | ICD-10-CM | POA: Insufficient documentation

## 2012-06-14 NOTE — Assessment & Plan Note (Signed)
Although depression is improved, insomnia is not.  -Increase daily exercise. She just started exercising.  -Continue good sleep hygiene  -Try melatonin, chamomile tea.  -Follow-up prn month persistent significant insomnia

## 2012-06-14 NOTE — Assessment & Plan Note (Signed)
She would like referral to discuss reversal of BTL. She is in a stable relationship with boyfriend who does not have any biological children and patient is interested in viability of being pregnant. We will refer to [facility] for consultation.

## 2012-06-14 NOTE — Assessment & Plan Note (Signed)
Improved.  Continue Wellbutrin 200 mg qAM. We will not increase dose at this time. She tried weaning herself off because she felt better but then had to restart due to increased anxiety.  Follow-up in 2-3 months.  She requested to resume to work full time. Work note given.

## 2012-06-14 NOTE — Patient Instructions (Signed)
Continue Wellbutrin 200 mg in the morning  Follow-up in 1 month if your sleep is bothering you, or before July Exercise 30 minutes, 5 times a week  Try the melatonin again. If it makes you feel groggy, decrease the dose of melatonin. Bring the bottle next time.

## 2012-06-14 NOTE — Assessment & Plan Note (Signed)
See insomnia A/P 

## 2012-06-14 NOTE — Progress Notes (Signed)
  Subjective:    Patient ID: Wendy Duffy, female    DOB: 1975-06-16, 37 y.o.   MRN: [id number]  HPI She did not take medicine for 1 week about 2 weeks ago because she was feeling well. Her anxiety and irritability increased with crying spells that made her restart her medication.  She is taking 200 mg Wellbutrin AM.   She wants to return to full duties at work.   # Difficulty with sleep Endorses difficulty sleeping. Sometimes falling asleep. Sometimes she wakes up around 3 am and cannot fall back asleep. She stays awake for about an hour.  This happens 4-5 times a week.  This has been going on for years.  Sleep schedule: in bed between 9-11 pm; wakes up at 8 am   # She is interested in having more children with boyfriend.  She wants to know if her BTL can be reversed.   Therapies tried: -Nyquil PM did not really help -Turning off electronics, reading does not help   Review of Systems  Allergies, medication, past medical history reviewed.  Smoking status noted.     Objective:   Physical Exam GEN: NAD; overweight PSYCH: does not appear anxious or depressed CV: RRR; no m/r/g     Assessment & Plan:

## 2012-06-21 ENCOUNTER — Encounter: Payer: Self-pay | Admitting: Obstetrics & Gynecology

## 2012-07-13 ENCOUNTER — Other Ambulatory Visit: Payer: Self-pay | Admitting: Family Medicine

## 2012-07-20 ENCOUNTER — Encounter: Payer: BC Managed Care – HMO | Admitting: Obstetrics & Gynecology

## 2012-10-11 ENCOUNTER — Encounter: Payer: Self-pay | Admitting: Family Medicine

## 2012-10-11 ENCOUNTER — Ambulatory Visit (INDEPENDENT_AMBULATORY_CARE_PROVIDER_SITE_OTHER): Payer: BC Managed Care – HMO | Admitting: Family Medicine

## 2012-10-11 VITALS — BP 136/90 | HR 69 | Temp 98.6°F | Ht 61.0 in | Wt 193.2 lb

## 2012-10-11 DIAGNOSIS — R21 Rash and other nonspecific skin eruption: Secondary | ICD-10-CM | POA: Insufficient documentation

## 2012-10-11 MED ORDER — CAMPHOR-MENTHOL 0.5-0.5 % EX LOTN: TOPICAL_LOTION | CUTANEOUS | Status: DC | PRN

## 2012-10-11 MED ORDER — TRIAMCINOLONE ACETONIDE 0.1 % EX CREA: TOPICAL_CREAM | Freq: Two times a day (BID) | CUTANEOUS | Status: DC

## 2012-10-11 MED ORDER — HYDROXYZINE HCL 50 MG PO TABS: 50.0000 mg | ORAL_TABLET | Freq: Three times a day (TID) | ORAL | Status: DC | PRN

## 2012-10-11 NOTE — Patient Instructions (Addendum)
It was nice seeing you today.  I have prescribed a topical steroid, and a lotion for your rash and itching.  I have also prescribed Atarax for itching.  If it does not improve or worsens please return for further evaluation.

## 2012-10-11 NOTE — Progress Notes (Signed)
Subjective:     Patient ID: Wendy Duffy, female   DOB: [birth year], 37 y.o.   MRN: [id number]  HPI Ms. Ham presents to the clinic today for evaluation of rash.  1) Rash/? Bites - Patient reports that she has had several red areas on her right upper arm and back for 1 week. - She does not recall being bit by an insect or coming into contact with an allergen/substance/plant. No new medicines. - She states that the "rash" itches severely and keeps her up at night.   - She has tried Triamcinolone, Hydrocortisone and benadryl without improvement. - Denies drainage.  Also denies recent illness, fevers, chills.  Review of Systems Per HPI    Objective:   Physical Exam Filed Vitals:   10/11/12 1330  BP: 136/90  Pulse: 69  Temp: 98.6 F (37 C)   General: well appearing female, pleasant, conversational. NAD. Skin: several (>10) raised erythematous areas noted on the back of the upper arm (tricep region), around the axilla and around the upper thoracic spine near the scapula.  Some of the lesions appears slightly vesicular.  Others have a central eschar from excoriation. No purulent drainage.  Non tender.    Assessment:     See Problem list     Plan:

## 2012-10-11 NOTE — Assessment & Plan Note (Signed)
Unclear etiology.  Appear like insect bites (i.e. Mosquito's), but patient does not recall being bit and lesions are localized. Lesions do appear to be along a dermatomal distribution, but it is non painful and doesn't appear like typical herpes zoster. Will treat symptomatically with Triamcinolone, Sarna, and Hydroxyzine.

## 2012-10-29 ENCOUNTER — Telehealth: Payer: Self-pay | Admitting: Family Medicine

## 2012-10-29 NOTE — Telephone Encounter (Signed)
Pt has been having increased urge to urinate all day. Pt also reports some vaginal discharge with "clumpiness" with a brownish color that she was concerned could be bleeding, but she has not had any active vaginal bleeding. Pt reported some cramping pain earlier, but other fever/chills, nausea/vomiting. Pt did have some back pain for two weeks that subsided by itself. Pt also reports that her "gastritis has been acting up for the past week" but pt has been taking omeprazole. Pt is not actively uncomfortable but is worried she may be getting a UTI and wants to make sure it's "treated before it gets really bad." Explained to pt that I cannot give her an Rx for antibiotics without examining her. Advised supportive care with good hydration, Tylenol for pain, rest. Also suggested AZO product(s) OTC which she states have helped in the past. Recommended pt go to urgent care tomorrow and/or call the clinic first thing Monday morning, since she appears to have several complaints and has not been seen in clinic in a few months. Strongly recommended that if symptoms get worse or if she develops fever/chills N/V, worse pain, active bleeding, etc, that she come to the ED sooner for evaluation. Pt voiced understanding and expressed thanks.  Stephanie Coup Daden Mahany, MD 10/29/2012, 7:55 PM

## 2012-12-19 ENCOUNTER — Other Ambulatory Visit: Payer: Self-pay | Admitting: Family Medicine

## 2013-01-26 ENCOUNTER — Ambulatory Visit (INDEPENDENT_AMBULATORY_CARE_PROVIDER_SITE_OTHER): Payer: BC Managed Care – HMO | Admitting: Family Medicine

## 2013-01-26 ENCOUNTER — Encounter: Payer: Self-pay | Admitting: Family Medicine

## 2013-01-26 VITALS — BP 121/84 | HR 89 | Temp 98.0°F | Wt 189.0 lb

## 2013-01-26 DIAGNOSIS — Z23 Encounter for immunization: Secondary | ICD-10-CM

## 2013-01-26 DIAGNOSIS — F411 Generalized anxiety disorder: Secondary | ICD-10-CM

## 2013-01-26 DIAGNOSIS — F419 Anxiety disorder, unspecified: Secondary | ICD-10-CM

## 2013-01-26 DIAGNOSIS — F329 Major depressive disorder, single episode, unspecified: Secondary | ICD-10-CM

## 2013-01-26 DIAGNOSIS — F32A Depression, unspecified: Secondary | ICD-10-CM

## 2013-01-26 DIAGNOSIS — K2970 Gastritis, unspecified, without bleeding: Secondary | ICD-10-CM | POA: Insufficient documentation

## 2013-01-26 DIAGNOSIS — G479 Sleep disorder, unspecified: Secondary | ICD-10-CM

## 2013-01-26 MED ORDER — BUSPIRONE HCL 7.5 MG PO TABS: 7.5000 mg | ORAL_TABLET | Freq: Two times a day (BID) | ORAL | Status: DC

## 2013-01-26 NOTE — Assessment & Plan Note (Signed)
Discussed options for treatment  - trial of buspar as an additional agent for more anxiety control  - start 7.5mg  bid and can self titrate as per instructions she was given today - encouraged to start seeing a counselor - shown the number on her insurance card to call in order to find out what is covered - no SI/HI - will f/u in 1 month to see if improvement

## 2013-01-26 NOTE — Progress Notes (Signed)
Patient ID: Wendy Duffy, female   DOB: 1975-08-14, 37 y.o.   MRN: [id number] FAMILY MEDICINE OFFICE NOTE  Chief Complaint:  Gastritis Depression anxiety  Primary Care Physician: Rodman Pickle, MD  HPI:  Wendy Duffy    1) Insomnia - when she lays down at night, her mind is racing constantly - takes 3-4 hours to fall asleep Then wakes up again around 4am and can't go back to sleep easily - usually goes to bed around 9 or 10 and gets up around 4-5 to go to work - has had years of trouble with insomnia - has had a normal sleep study - the lack of sleep is starting to affect her mood - trying melatonin and works if she takes 2. Doesn't know what dosage she is taking  No snoring, difficulty breathing, some daytime sleepiness.   2) depression/anxiety - has been on wellbutrin 100mg  BID for several years - feels it does a good job of controlling her depression but not anxiety - believes that is is her anxiety flaring that has made her insomnia worse - tried zoloft in the past but had auditory hallucinations with it - phq-9 today of 20 - struggles with racing thoughts - no SI/HI - no mania - has a fear of getting addicted to medication that is habit forming as alcoholism runs in her family. Has been tempted to drink in order to calm down but is not doing so due to the fear.   3) gastritis - flaring up with the increased anxiety - was taking prilosec but is somewhat expensive so wondering if we have samples - no weight changes, hematemesis, hematochezia, melena  PMHx:  Past Medical History  Diagnosis Date  . Hypertension   . Hemorrhoids     Past Surgical History  Procedure Laterality Date  . Cesarean section  1998, 2003  . Wisdom tooth extraction      FAMHx:  History reviewed. No pertinent family history.  SOCHx:   reports that she has never smoked. She does not have any smokeless tobacco history on file. She reports that she drinks alcohol. She reports that she does  not use illicit drugs.  ALLERGIES:  Allergies  Allergen Reactions  . Zoloft [Sertraline Hcl]     Made her feel very anxious     ROS: Pertinent ROS as seen in HPI. Otherwise negative.   HOME MEDS: Current Outpatient Prescriptions  Medication Sig Dispense Refill  . amLODipine (NORVASC) 10 MG tablet take 1 tablet by mouth once daily  90 tablet  3  . buPROPion (WELLBUTRIN) 100 MG tablet Take 200 mg by mouth every morning.      Marland Kitchen buPROPion (WELLBUTRIN) 100 MG tablet take 1 tablet by mouth twice a day  60 tablet  3  . busPIRone (BUSPAR) 7.5 MG tablet Take 1 tablet (7.5 mg total) by mouth 2 (two) times daily.  60 tablet  1  . camphor-menthol (SARNA) lotion Apply topically as needed for itching.  222 mL  0  . hydrochlorothiazide (HYDRODIURIL) 25 MG tablet take 1 tablet by mouth once daily  90 tablet  0  . hydrOXYzine (ATARAX/VISTARIL) 50 MG tablet Take 1 tablet (50 mg total) by mouth 3 (three) times daily as needed for itching.  30 tablet  0  . losartan (COZAAR) 25 MG tablet take 1 tablet by mouth once daily  90 tablet  3  . omeprazole (PRILOSEC) 40 MG capsule Take 1 capsule (40 mg total) by mouth daily.  30  capsule  3  . polyethylene glycol powder (GLYCOLAX/MIRALAX) powder       . propranolol (INDERAL) 20 MG tablet Take 1 tablet (20 mg total) by mouth 2 (two) times daily as needed.  30 tablet  0  . triamcinolone cream (KENALOG) 0.1 % Apply topically 2 (two) times daily. Use for up to 2 weeks.  30 g  0   No current facility-administered medications for this visit.    LABS/IMAGING: No results found for this or any previous visit (from the past 48 hour(s)). No results found.  VITALS: BP 121/84  Pulse 89  Temp(Src) 98 F (36.7 C) (Oral)  Wt 189 lb (85.73 kg)  LMP 01/15/2013  EXAM: Gen: NAD, well appearing ABD: soft, NT/ND.  Psych: great insight into condition and symptoms. No obvious depression or anxiety     ASSESSMENT: Anxiety  SLEEP DISORDER  Depression  Need for  prophylactic vaccination and inoculation against influenza  Gastritis  PLAN: See assessment and plan section

## 2013-01-26 NOTE — Assessment & Plan Note (Signed)
Trial of ranitidine BID as is much less expensive - pt to get at [facility] - no red flag symptoms

## 2013-01-26 NOTE — Patient Instructions (Signed)
For your anxiety, lets try something called buspar.   Start by taking 7.5mg  twice a day In a week, if you are doing well with it but don't feel like it is strong enough, let me know and I will send an prescription for the 5mg  tablets.   Then start taking 7.5mg  and add a 1/2 tablet of the 5mg  for a total of 10mg  twice a day In a week after that, you can increase it to one 7.5mg  tablet and one 5mg  tablet for a total of 12.5mg  twice a day  Come back and see [place] in 1 month

## 2013-01-26 NOTE — Assessment & Plan Note (Signed)
Discussed treating sleep vs. Anxiety and pt feels that anxiety is more the concern.  - wants to try and avoid habit forming medications - pt to find out what dosing of melatonin she is on. She was told that the max dose is 10mg  so if she is using less than that, she can take 2 as that works for her - cont good sleep hygiene also - already had sleep study and was normal

## 2013-02-25 ENCOUNTER — Other Ambulatory Visit: Payer: Self-pay | Admitting: Family Medicine

## 2013-03-10 ENCOUNTER — Other Ambulatory Visit: Payer: Self-pay | Admitting: Family Medicine

## 2013-03-27 ENCOUNTER — Encounter (HOSPITAL_COMMUNITY): Payer: Self-pay | Admitting: Emergency Medicine

## 2013-03-27 ENCOUNTER — Emergency Department (HOSPITAL_COMMUNITY)
Admission: EM | Admit: 2013-03-27 | Discharge: 2013-03-28 | Disposition: A | Discharge status: Home / Self Care | Payer: BC Managed Care – PPO | Attending: Emergency Medicine | Admitting: Emergency Medicine

## 2013-03-27 ENCOUNTER — Ambulatory Visit: Payer: BC Managed Care – HMO | Admitting: Family Medicine

## 2013-03-27 DIAGNOSIS — F331 Major depressive disorder, recurrent, moderate: Secondary | ICD-10-CM

## 2013-03-27 DIAGNOSIS — E669 Obesity, unspecified: Secondary | ICD-10-CM

## 2013-03-27 DIAGNOSIS — I517 Cardiomegaly: Secondary | ICD-10-CM

## 2013-03-27 DIAGNOSIS — Z0000 Encounter for general adult medical examination without abnormal findings: Secondary | ICD-10-CM

## 2013-03-27 DIAGNOSIS — IMO0002 Reserved for concepts with insufficient information to code with codable children: Secondary | ICD-10-CM | POA: Insufficient documentation

## 2013-03-27 DIAGNOSIS — I10 Essential (primary) hypertension: Secondary | ICD-10-CM | POA: Insufficient documentation

## 2013-03-27 DIAGNOSIS — K2970 Gastritis, unspecified, without bleeding: Secondary | ICD-10-CM

## 2013-03-27 DIAGNOSIS — IMO0001 Reserved for inherently not codable concepts without codable children: Secondary | ICD-10-CM

## 2013-03-27 DIAGNOSIS — R21 Rash and other nonspecific skin eruption: Secondary | ICD-10-CM

## 2013-03-27 DIAGNOSIS — F32A Depression, unspecified: Secondary | ICD-10-CM

## 2013-03-27 DIAGNOSIS — Z79899 Other long term (current) drug therapy: Secondary | ICD-10-CM | POA: Insufficient documentation

## 2013-03-27 DIAGNOSIS — F419 Anxiety disorder, unspecified: Secondary | ICD-10-CM

## 2013-03-27 DIAGNOSIS — F329 Major depressive disorder, single episode, unspecified: Secondary | ICD-10-CM | POA: Insufficient documentation

## 2013-03-27 DIAGNOSIS — Z7982 Long term (current) use of aspirin: Secondary | ICD-10-CM | POA: Insufficient documentation

## 2013-03-27 DIAGNOSIS — F332 Major depressive disorder, recurrent severe without psychotic features: Secondary | ICD-10-CM

## 2013-03-27 DIAGNOSIS — R197 Diarrhea, unspecified: Secondary | ICD-10-CM | POA: Insufficient documentation

## 2013-03-27 DIAGNOSIS — F3289 Other specified depressive episodes: Secondary | ICD-10-CM | POA: Insufficient documentation

## 2013-03-27 DIAGNOSIS — R45851 Suicidal ideations: Secondary | ICD-10-CM

## 2013-03-27 DIAGNOSIS — Z3202 Encounter for pregnancy test, result negative: Secondary | ICD-10-CM | POA: Insufficient documentation

## 2013-03-27 DIAGNOSIS — F39 Unspecified mood [affective] disorder: Secondary | ICD-10-CM | POA: Insufficient documentation

## 2013-03-27 DIAGNOSIS — G479 Sleep disorder, unspecified: Secondary | ICD-10-CM

## 2013-03-27 LAB — COMPREHENSIVE METABOLIC PANEL
ALBUMIN: 3.4 g/dL — AB (ref 3.5–5.2)
ALK PHOS: 59 U/L (ref 39–117)
ALT: 21 U/L (ref 0–35)
AST: 19 U/L (ref 0–37)
BUN: 11 mg/dL (ref 6–23)
CALCIUM: 8.8 mg/dL (ref 8.4–10.5)
CO2: 25 mEq/L (ref 19–32)
Chloride: 102 mEq/L (ref 96–112)
Creatinine, Ser: 0.89 mg/dL (ref 0.50–1.10)
GFR calc Af Amer: >90 mL/min (ref >90)
GFR calc non Af Amer: 82 mL/min — ABNORMAL LOW (ref >90)
Glucose, Bld: 91 mg/dL (ref 70–99)
POTASSIUM: 3.8 meq/L (ref 3.7–5.3)
SODIUM: 136 meq/L — AB (ref 137–147)
TOTAL PROTEIN: 6.7 g/dL (ref 6.0–8.3)
Total Bilirubin: 0.4 mg/dL (ref 0.3–1.2)

## 2013-03-27 LAB — RAPID URINE DRUG SCREEN, HOSP PERFORMED
Amphetamines: NOT DETECTED
BARBITURATES: NOT DETECTED
BENZODIAZEPINES: NOT DETECTED
Cocaine: NOT DETECTED
OPIATES: NOT DETECTED
TETRAHYDROCANNABINOL: NOT DETECTED

## 2013-03-27 LAB — CBC
HCT: 40 % (ref 36.0–46.0)
HEMOGLOBIN: 13.4 g/dL (ref 12.0–15.0)
MCH: 28.2 pg (ref 26.0–34.0)
MCHC: 33.5 g/dL (ref 30.0–36.0)
MCV: 84.2 fL (ref 78.0–100.0)
PLATELETS: 315 10*3/uL (ref 150–400)
RBC: 4.75 MIL/uL (ref 3.87–5.11)
RDW: 13 % (ref 11.5–15.5)
WBC: 5.4 10*3/uL (ref 4.0–10.5)

## 2013-03-27 LAB — ETHANOL: Alcohol, Ethyl (B): <11 mg/dL (ref 0–11)

## 2013-03-27 LAB — ACETAMINOPHEN LEVEL

## 2013-03-27 LAB — POCT PREGNANCY, URINE: Preg Test, Ur: NEGATIVE

## 2013-03-27 LAB — SALICYLATE LEVEL: Salicylate Lvl: <2 mg/dL — ABNORMAL LOW (ref 2.8–20.0)

## 2013-03-27 MED ORDER — ONDANSETRON HCL 4 MG PO TABS: 4.0000 mg | ORAL_TABLET | Freq: Three times a day (TID) | ORAL | Status: DC | PRN

## 2013-03-27 MED ORDER — HYDROXYZINE HCL 25 MG PO TABS: 50.0000 mg | ORAL_TABLET | Freq: Three times a day (TID) | ORAL | Status: DC | PRN

## 2013-03-27 MED ORDER — HYDROXYZINE HCL 25 MG PO TABS: 100.0000 mg | ORAL_TABLET | Freq: Three times a day (TID) | ORAL | Status: DC | PRN

## 2013-03-27 MED ORDER — LOSARTAN POTASSIUM 25 MG PO TABS
25.0000 mg | ORAL_TABLET | Freq: Every day | ORAL | Status: DC
  Administered 2013-03-27 – 2013-03-28 (×2): 25 mg via ORAL
  Filled 2013-03-27 (×2): qty 1

## 2013-03-27 MED ORDER — LORAZEPAM 1 MG PO TABS
1.0000 mg | ORAL_TABLET | Freq: Three times a day (TID) | ORAL | Status: DC | PRN
  Administered 2013-03-27: 1 mg via ORAL
  Filled 2013-03-27: qty 1

## 2013-03-27 MED ORDER — AMLODIPINE BESYLATE 10 MG PO TABS
10.0000 mg | ORAL_TABLET | Freq: Every day | ORAL | Status: DC
  Administered 2013-03-27 – 2013-03-28 (×2): 10 mg via ORAL
  Filled 2013-03-27 (×2): qty 1

## 2013-03-27 MED ORDER — PANTOPRAZOLE SODIUM 40 MG PO TBEC
80.0000 mg | DELAYED_RELEASE_TABLET | Freq: Every day | ORAL | Status: DC
  Administered 2013-03-27 – 2013-03-28 (×2): 80 mg via ORAL
  Filled 2013-03-27 (×2): qty 2

## 2013-03-27 MED ORDER — BUSPIRONE HCL 15 MG PO TABS
7.5000 mg | ORAL_TABLET | Freq: Two times a day (BID) | ORAL | Status: DC
  Administered 2013-03-27 – 2013-03-28 (×3): 7.5 mg via ORAL
  Filled 2013-03-27 (×4): qty 1

## 2013-03-27 MED ORDER — BUPROPION HCL 100 MG PO TABS
100.0000 mg | ORAL_TABLET | Freq: Every day | ORAL | Status: DC
  Administered 2013-03-27 – 2013-03-28 (×2): 100 mg via ORAL
  Filled 2013-03-27 (×2): qty 1

## 2013-03-27 MED ORDER — IBUPROFEN 200 MG PO TABS: 600.0000 mg | ORAL_TABLET | Freq: Four times a day (QID) | ORAL | Status: DC | PRN

## 2013-03-27 MED ORDER — HYDROCHLOROTHIAZIDE 25 MG PO TABS
25.0000 mg | ORAL_TABLET | Freq: Every day | ORAL | Status: DC
  Administered 2013-03-27 – 2013-03-28 (×2): 25 mg via ORAL
  Filled 2013-03-27 (×2): qty 1

## 2013-03-27 NOTE — ED Notes (Signed)
Pt reports to ed with c/o feeling tired and not being able to sleep. Pt is crying in triage stating she is "living in fog, burden to her family". Pt reports hx of depression, takes Wellbutrin, is compliant with medications. Pt is expressing suicidal ideations without plan.

## 2013-03-27 NOTE — ED Provider Notes (Signed)
Medical screening examination/treatment/procedure(s) were performed by non-physician practitioner and as supervising physician I was immediately available for consultation/collaboration.  EKG Interpretation   None         Blanchie Dessert, MD 03/27/13 2103

## 2013-03-27 NOTE — Consult Note (Signed)
[facility] Face-to-Face Psychiatry Consult   Reason for Consult:  Insomnia Referring Physician:  EDP Wendy Duffy is an 38 y.o. female.  Assessment: AXIS I:  Major Depression, Recurrent severe AXIS II:  Deferred AXIS III:   Past Medical History  Diagnosis Date  . Hypertension   . Hemorrhoids    AXIS IV:  economic problems, housing problems, occupational problems, other psychosocial or environmental problems, problems related to legal system/crime, problems related to social environment, problems with access to health care services and problems with primary support group AXIS V:  61-70 mild symptoms  Plan:  No evidence of imminent risk to self or others at present.    Subjective:   Wendy Duffy is a 38 y.o. female evaluated with insomnia. She reports, "living in a fog," she feels like a burden to her family.  HPI: Patient is a 73 year [place] American female, domiciled, and employed with a h/o major depressive disorder. She is here with chief complaint of insomnia for the past week. She states that the depression is a  9/10 on a scale and anxiety 7/10.  She reports that she takes Wellbutrin, and Buspar. She is receiving outpatient services at [facility]. Her last outpatient appointment was a week ago. She denies any previous hospitalizations.  She reports some tearfulness, poor sleep, appetite is fair, social withdrawal, irritability and feelings of hopelessness, helplessness, and worthlessness. She denies current SI/HI/AVH. She denies any suicidal plan. She couldn't identify any specific stressor. She lives with her boyfriend and 60 year old son. She currently works at [employer], as a Charity fundraiser. She states that she smokes cannabis, a joint or blunt; last use last week. She also drinks, a wine or beer every other day.  Patient is dressed in hospital scrub, wears glasses, alert, oriented x4 with normal speech and normal motor behavior. Patient's eye contact is fair. Her thought  process is coherent and relevant with no evidence of responding to internal stimuli or delusional content. Her memory appears intact. Her mood is dysphoric, restricted affect. Patient is calm and cooperative throughout assessment but does not meet criteria for inpatient services, as she denies suicidal ideations, no plan.   HPI Elements:   Location:  generalized. Quality:  chronic. Severity:  mild. Timing:  constant. Duration:  past week. Context:  stressors.  Past Psychiatric History: Past Medical History  Diagnosis Date  . Hypertension   . Hemorrhoids     reports that she has never smoked. She does not have any smokeless tobacco history on file. She reports that she drinks alcohol. She reports that she uses illicit drugs (Marijuana). History reviewed. No pertinent family history.         Allergies:   Allergies  Allergen Reactions  . Zoloft [Sertraline Hcl]     Made her feel very anxious     ACT Assessment Complete:  Yes:    Educational Status    Risk to Self: Risk to self Is patient at risk for suicide?: Yes Substance abuse history and/or treatment for substance abuse?: No  Risk to Others:    Abuse:    Prior Inpatient Therapy:    Prior Outpatient Therapy:    Additional Information:                    Objective: Blood pressure 150/100, pulse 79, temperature 97.9 F (36.6 C), temperature source Oral, resp. rate 20, last menstrual period 02/27/2013, SpO2 100.00%.There is no weight on file to calculate BMI.  Results for orders placed during the hospital encounter of 03/27/13 (from the past 72 hour(s))  ACETAMINOPHEN LEVEL     Status: None   Collection Time    03/27/13 10:40 AM      Result Value Range   Acetaminophen (Tylenol), Serum <15.0  10 - 30 ug/mL   Comment:            THERAPEUTIC CONCENTRATIONS VARY     SIGNIFICANTLY. A RANGE OF 10-30     ug/mL MAY BE AN EFFECTIVE     CONCENTRATION FOR MANY PATIENTS.     HOWEVER, SOME ARE BEST TREATED     AT  CONCENTRATIONS OUTSIDE THIS     RANGE.     ACETAMINOPHEN CONCENTRATIONS     >150 ug/mL AT 4 HOURS AFTER     INGESTION AND >50 ug/mL AT 12     HOURS AFTER INGESTION ARE     OFTEN ASSOCIATED WITH TOXIC     REACTIONS.  CBC     Status: None   Collection Time    03/27/13 10:40 AM      Result Value Range   WBC 5.4  4.0 - 10.5 K/uL   RBC 4.75  3.87 - 5.11 MIL/uL   Hemoglobin 13.4  12.0 - 15.0 g/dL   HCT 40.0  36.0 - 46.0 %   MCV 84.2  78.0 - 100.0 fL   MCH 28.2  26.0 - 34.0 pg   MCHC 33.5  30.0 - 36.0 g/dL   RDW 13.0  11.5 - 15.5 %   Platelets 315  150 - 400 K/uL  COMPREHENSIVE METABOLIC PANEL     Status: Abnormal   Collection Time    03/27/13 10:40 AM      Result Value Range   Sodium 136 (*) 137 - 147 mEq/L   Potassium 3.8  3.7 - 5.3 mEq/L   Chloride 102  96 - 112 mEq/L   CO2 25  19 - 32 mEq/L   Glucose, Bld 91  70 - 99 mg/dL   BUN 11  6 - 23 mg/dL   Creatinine, Ser 0.89  0.50 - 1.10 mg/dL   Calcium 8.8  8.4 - 10.5 mg/dL   Total Protein 6.7  6.0 - 8.3 g/dL   Albumin 3.4 (*) 3.5 - 5.2 g/dL   AST 19  0 - 37 U/L   ALT 21  0 - 35 U/L   Alkaline Phosphatase 59  39 - 117 U/L   Total Bilirubin 0.4  0.3 - 1.2 mg/dL   GFR calc non Af Amer 82 (*) >90 mL/min   GFR calc Af Amer >90  >90 mL/min   Comment: (NOTE)     The eGFR has been calculated using the CKD EPI equation.     This calculation has not been validated in all clinical situations.     eGFR's persistently <90 mL/min signify possible Chronic Kidney     Disease.  ETHANOL     Status: None   Collection Time    03/27/13 10:40 AM      Result Value Range   Alcohol, Ethyl (B) <11  0 - 11 mg/dL   Comment:            LOWEST DETECTABLE LIMIT FOR     SERUM ALCOHOL IS 11 mg/dL     FOR MEDICAL PURPOSES ONLY  SALICYLATE LEVEL     Status: Abnormal   Collection Time    03/27/13 10:40 AM      Result Value  Range   Salicylate Lvl <0.0 (*) 2.8 - 20.0 mg/dL  URINE RAPID DRUG SCREEN (HOSP PERFORMED)     Status: None   Collection Time     03/27/13 10:45 AM      Result Value Range   Opiates NONE DETECTED  NONE DETECTED   Cocaine NONE DETECTED  NONE DETECTED   Benzodiazepines NONE DETECTED  NONE DETECTED   Amphetamines NONE DETECTED  NONE DETECTED   Tetrahydrocannabinol NONE DETECTED  NONE DETECTED   Barbiturates NONE DETECTED  NONE DETECTED   Comment:            DRUG SCREEN FOR MEDICAL PURPOSES     ONLY.  IF CONFIRMATION IS NEEDED     FOR ANY PURPOSE, NOTIFY LAB     WITHIN 5 DAYS.                LOWEST DETECTABLE LIMITS     FOR URINE DRUG SCREEN     Drug Class       Cutoff (ng/mL)     Amphetamine      1000     Barbiturate      200     Benzodiazepine   762     Tricyclics       263     Opiates          300     Cocaine          300     THC              50  POCT PREGNANCY, URINE     Status: None   Collection Time    03/27/13 10:50 AM      Result Value Range   Preg Test, Ur NEGATIVE  NEGATIVE   Comment:            THE SENSITIVITY OF THIS     METHODOLOGY IS >24 mIU/mL   Labs are reviewed and are pertinent for nothing.  Current Facility-Administered Medications  Medication Dose Route Frequency Provider Last Rate Last Dose  . amLODipine (NORVASC) tablet 10 mg  10 mg Oral Daily Marissa Sciacca, PA-C   10 mg at 03/27/13 1333  . buPROPion [facility]) tablet 100 mg  100 mg Oral Daily Marissa Sciacca, PA-C   100 mg at 03/27/13 1333  . busPIRone (BUSPAR) tablet 7.5 mg  7.5 mg Oral BID Marissa Sciacca, PA-C   7.5 mg at 03/27/13 1334  . hydrochlorothiazide (HYDRODIURIL) tablet 25 mg  25 mg Oral Daily Marissa Sciacca, PA-C   25 mg at 03/27/13 1333  . hydrOXYzine (ATARAX/VISTARIL) tablet 50 mg  50 mg Oral TID PRN Marissa Sciacca, PA-C      . ibuprofen (ADVIL,MOTRIN) tablet 600 mg  600 mg Oral Q6H PRN Marissa Sciacca, PA-C      . LORazepam (ATIVAN) tablet 1 mg  1 mg Oral Q8H PRN Marissa Sciacca, PA-C      . losartan (COZAAR) tablet 25 mg  25 mg Oral Daily Marissa Sciacca, PA-C   25 mg at 03/27/13 1334  . ondansetron  (ZOFRAN) tablet 4 mg  4 mg Oral Q8H PRN Marissa Sciacca, PA-C      . pantoprazole (PROTONIX) EC tablet 80 mg  80 mg Oral Daily Marissa Sciacca, PA-C   80 mg at 03/27/13 1333   Current Outpatient Prescriptions  Medication Sig Dispense Refill  . amLODipine (NORVASC) 10 MG tablet take 1 tablet by mouth once daily  90 tablet  1  . Aspirin-Acetaminophen-Caffeine (GOODY  HEADACHE PO) Take 1 packet by mouth once.      Marland Kitchen buPROPion (WELLBUTRIN) 100 MG tablet take 1 tablet by mouth twice a day  60 tablet  1  . busPIRone (BUSPAR) 7.5 MG tablet Take 1 tablet (7.5 mg total) by mouth 2 (two) times daily.  60 tablet  1  . camphor-menthol (SARNA) lotion Apply topically as needed for itching.  222 mL  0  . hydrochlorothiazide (HYDRODIURIL) 25 MG tablet take 1 tablet by mouth once daily  90 tablet  0  . hydrOXYzine (ATARAX/VISTARIL) 50 MG tablet Take 1 tablet (50 mg total) by mouth 3 (three) times daily as needed for itching.  30 tablet  0  . ibuprofen (ADVIL,MOTRIN) 200 MG tablet Take 600 mg by mouth every 6 (six) hours as needed for moderate pain.      Marland Kitchen losartan (COZAAR) 25 MG tablet take 1 tablet by mouth once daily  90 tablet  1  . omeprazole (PRILOSEC) 40 MG capsule take 1 capsule by mouth once daily  30 capsule  5    Psychiatric Specialty Exam:     Blood pressure 150/100, pulse 79, temperature 97.9 F (36.6 C), temperature source Oral, resp. rate 20, last menstrual period 02/27/2013, SpO2 100.00%.There is no weight on file to calculate BMI.  General Appearance: Casual, Disheveled and Guarded  Eye Contact::  Fair  Speech:  Clear and Coherent and Slow  Volume:  Decreased  Mood:  Anxious, Depressed, Dysphoric, Hopeless, Irritable and Worthless  Affect:  Restricted  Thought Process:  Linear and Logical  Orientation:  Full (Time, Place, and Person)  Thought Content:  Negative and Rumination  Suicidal Thoughts:  No  Homicidal Thoughts:  No  Memory:  Immediate;   Fair Recent;   Fair Remote;   Fair   Judgement:  Fair  Insight:  Lacking and Shallow  Psychomotor Activity:  Decreased and Psychomotor Retardation  Concentration:  Fair  Recall:  Fair  Akathisia:  No  Handed:  Right  AIMS (if indicated):   0  Assets:  Desire for Improvement Physical Health Resilience  Sleep:   poor   Treatment Plan Summary: To follow up with outpatient [facility]. Prescribe Hydroxyzine 100 mg at HS. She denies SI/HI/AVH.   [facility] 03/27/2013 2:16 PM  Patient discussed, and agree with plan

## 2013-03-27 NOTE — ED Provider Notes (Signed)
CSN: [id number]     Arrival date & time 03/27/13  0948 History   First MD Initiated Contact with Patient 03/27/13 [phone]     Chief Complaint  Patient presents with  . Depression   (Consider location/radiation/quality/duration/timing/severity/associated sxs/prior Treatment) The history is provided by the patient. No language interpreter was used.  Wendy Duffy is a 38 year old female with past nuchal history of hypertension, hemorrhoids, depression presenting to emergency department with increased depression and suicidal ideation. Patient reports that she hasn't been able to sleep properly and within a week-reports approximately 2 hours of sleep per night. Patient reports that she's been feeling down over the past couple of days. Reports that she has been "living in a fog." She stated that she's had his upcoming more agitated very easily. Patient currently lives with boyfriend and 49 year old child-reports that she has not spent any time of the child's secondary to easy agitation. Patient reports that she has history of depression, has not sought help. Reports that she has tried to get a psychiatrist, but has had issues with insurance. Patient reports she's noticed she's been crying a lot. Reported increased suicidal ideation without a plan. Denied chest pain, shortness of breath, difficulty breathing, abdominal pain, nausea, vomiting, homicidal ideation, hallucinations. PCP Dr. Sheral Apley  Past Medical History  Diagnosis Date  . Hypertension   . Hemorrhoids    Past Surgical History  Procedure Laterality Date  . Cesarean section  1998, 2003  . Wisdom tooth extraction     History reviewed. No pertinent family history. History  Substance Use Topics  . Smoking status: Never Smoker   . Smokeless tobacco: Not on file  . Alcohol Use: Yes     Comment: occ   OB History   Grav Para Term Preterm Abortions TAB SAB Ect Mult Living                 Review of Systems  Constitutional: Negative for  fever and chills.  Respiratory: Negative for chest tightness and shortness of breath.   Cardiovascular: Negative for chest pain.  Gastrointestinal: Positive for diarrhea. Negative for nausea, vomiting and abdominal pain.  Neurological: Negative for weakness.  Psychiatric/Behavioral: Positive for suicidal ideas, sleep disturbance, dysphoric mood and agitation. Negative for hallucinations.  All other systems reviewed and are negative.    Allergies  Zoloft  Home Medications   Current Outpatient Rx  Name  Route  Sig  Dispense  Refill  . amLODipine (NORVASC) 10 MG tablet      take 1 tablet by mouth once daily   90 tablet   1   . Aspirin-Acetaminophen-Caffeine (GOODY HEADACHE PO)   Oral   Take 1 packet by mouth once.         Marland Kitchen buPROPion (WELLBUTRIN) 100 MG tablet      take 1 tablet by mouth twice a day   60 tablet   1   . busPIRone (BUSPAR) 7.5 MG tablet   Oral   Take 1 tablet (7.5 mg total) by mouth 2 (two) times daily.   60 tablet   1   . camphor-menthol (SARNA) lotion   Topical   Apply topically as needed for itching.   222 mL   0   . hydrochlorothiazide (HYDRODIURIL) 25 MG tablet      take 1 tablet by mouth once daily   90 tablet   0     Please have patient make appt for additional refil ...   . hydrOXYzine (ATARAX/VISTARIL) 50  MG tablet   Oral   Take 1 tablet (50 mg total) by mouth 3 (three) times daily as needed for itching.   30 tablet   0   . ibuprofen (ADVIL,MOTRIN) 200 MG tablet   Oral   Take 600 mg by mouth every 6 (six) hours as needed for moderate pain.         Marland Kitchen losartan (COZAAR) 25 MG tablet      take 1 tablet by mouth once daily   90 tablet   1   . omeprazole (PRILOSEC) 40 MG capsule      take 1 capsule by mouth once daily   30 capsule   5    BP 127/95  Pulse 80  Temp(Src) 98.7 F (37.1 C)  Resp 18  SpO2 100%  LMP 02/27/2013 Physical Exam  Nursing note and vitals reviewed. Constitutional: She is oriented to person,  place, and time. She appears well-developed and well-nourished. No distress.  Patient found sitting in chair, in fetal position, crying.  HENT:  Head: Normocephalic and atraumatic.  Mouth/Throat: Oropharynx is clear and moist. No oropharyngeal exudate.  Eyes: Conjunctivae and EOM are normal. Pupils are equal, round, and reactive to light. Right eye exhibits no discharge. Left eye exhibits no discharge.  Neck: Normal range of motion. Neck supple.  Cardiovascular: Normal rate, regular rhythm and normal heart sounds.  Exam reveals no friction rub.   No murmur heard. Pulses:      Radial pulses are 2+ on the right side, and 2+ on the left side.       Dorsalis pedis pulses are 2+ on the right side, and 2+ on the left side.  Pulmonary/Chest: Effort normal and breath sounds normal. No respiratory distress. She has no wheezes. She has no rales. She exhibits no tenderness.  Abdominal: Soft. Bowel sounds are normal. There is no tenderness. There is no guarding.  Musculoskeletal: Normal range of motion.  Full ROM to upper and lower extremities without difficulty noted, negative ataxia noted.  Lymphadenopathy:    She has no cervical adenopathy.  Neurological: She is alert and oriented to person, place, and time. She exhibits normal muscle tone. Coordination normal.  Cranial nerves III-XII grossly intact Strength 5+/5+ to upper and lower extremities bilaterally with resistance applied, equal distribution noted  Skin: Skin is warm and dry. No rash noted. She is not diaphoretic. No erythema.  Psychiatric: She has a normal mood and affect. Her behavior is normal. Thought content normal.    ED Course  Procedures (including critical care time)  Results for orders placed during the hospital encounter of 03/27/13  ACETAMINOPHEN LEVEL      Result Value Range   Acetaminophen (Tylenol), Serum <15.0  10 - 30 ug/mL  CBC      Result Value Range   WBC 5.4  4.0 - 10.5 K/uL   RBC 4.75  3.87 - 5.11 MIL/uL    Hemoglobin 13.4  12.0 - 15.0 g/dL   HCT 40.0  36.0 - 46.0 %   MCV 84.2  78.0 - 100.0 fL   MCH 28.2  26.0 - 34.0 pg   MCHC 33.5  30.0 - 36.0 g/dL   RDW 13.0  11.5 - 15.5 %   Platelets 315  150 - 400 K/uL  COMPREHENSIVE METABOLIC PANEL      Result Value Range   Sodium 136 (*) 137 - 147 mEq/L   Potassium 3.8  3.7 - 5.3 mEq/L   Chloride 102  96 - 112  mEq/L   CO2 25  19 - 32 mEq/L   Glucose, Bld 91  70 - 99 mg/dL   BUN 11  6 - 23 mg/dL   Creatinine, Ser 0.89  0.50 - 1.10 mg/dL   Calcium 8.8  8.4 - 10.5 mg/dL   Total Protein 6.7  6.0 - 8.3 g/dL   Albumin 3.4 (*) 3.5 - 5.2 g/dL   AST 19  0 - 37 U/L   ALT 21  0 - 35 U/L   Alkaline Phosphatase 59  39 - 117 U/L   Total Bilirubin 0.4  0.3 - 1.2 mg/dL   GFR calc non Af Amer 82 (*) >90 mL/min   GFR calc Af Amer >90  >90 mL/min  ETHANOL      Result Value Range   Alcohol, Ethyl (B) <11  0 - 11 mg/dL  SALICYLATE LEVEL      Result Value Range   Salicylate Lvl <1.6 (*) 2.8 - 20.0 mg/dL  URINE RAPID DRUG SCREEN (HOSP PERFORMED)      Result Value Range   Opiates NONE DETECTED  NONE DETECTED   Cocaine NONE DETECTED  NONE DETECTED   Benzodiazepines NONE DETECTED  NONE DETECTED   Amphetamines NONE DETECTED  NONE DETECTED   Tetrahydrocannabinol NONE DETECTED  NONE DETECTED   Barbiturates NONE DETECTED  NONE DETECTED    Labs Review Labs Reviewed  COMPREHENSIVE METABOLIC PANEL - Abnormal; Notable for the following:    Sodium 136 (*)    Albumin 3.4 (*)    GFR calc non Af Amer 82 (*)    All other components within normal limits  SALICYLATE LEVEL - Abnormal; Notable for the following:    Salicylate Lvl <1.0 (*)    All other components within normal limits  ACETAMINOPHEN LEVEL  CBC  ETHANOL  URINE RAPID DRUG SCREEN (HOSP PERFORMED)  PREGNANCY, URINE   Imaging Review No results found.  EKG Interpretation   None       MDM   1. Depression   2. Suicidal ideation    Filed Vitals:   03/27/13 1032  BP: 127/95  Pulse: 80  Temp:  98.7 F (37.1 C)  Resp: 18  SpO2: 100%    Patient presenting to emergency part with increased depression and suicidal ideation for the past couple weeks. Alert oriented. GCS 15. Heart rate and rhythm normal. Lungs clear to auscultation. Full range of motion to upper lower extremities bilaterally without difficulty or ataxia noted. Radial and DP pulses 2+ bilaterally. Abdomen soft, nontender upon palpation-bowel sounds normal active in all 4 quadrants. Patient tearful upon interview and examination. Appears goal oriented. Urine drug screen negative findings. Negative elevation of acetaminophen. CBC negative findings. CMP negative findings. Ethanol negative elevation. Salicylate negative elevation.  Patient medically cleared. Psych hold orders placed. Home medications reordered. Patient placed in [facility] long psych ED. Discussed case with Dr. Rockwell [place].  Jamse [place], PA-C 03/27/13 [place], PA-C 03/27/13 1757

## 2013-03-27 NOTE — ED Notes (Signed)
Pt and belongings have been wanded

## 2013-03-28 ENCOUNTER — Telehealth: Payer: Self-pay | Admitting: Family Medicine

## 2013-03-28 ENCOUNTER — Encounter (HOSPITAL_COMMUNITY): Payer: Self-pay | Admitting: Registered Nurse

## 2013-03-28 DIAGNOSIS — F331 Major depressive disorder, recurrent, moderate: Secondary | ICD-10-CM | POA: Diagnosis present

## 2013-03-28 MED ORDER — HYDROXYZINE HCL 50 MG PO TABS: 100.0000 mg | ORAL_TABLET | Freq: Three times a day (TID) | ORAL | Status: DC | PRN

## 2013-03-28 NOTE — Telephone Encounter (Signed)
Pt states most of her questions were answered by her therapist.  But she did want to make sure it was ok to take an "allergy" med for her insomnia.  Advised that it is used for both allergy and anxiety because it makes you sleepy.  Pt is acceptable and question appt time for Thursday.  Phone call complete. Sahithi Ordoyne, Salome Spotted

## 2013-03-28 NOTE — ED Notes (Signed)
Pt discharged, agrees to the no-harm contract and also verbally states she does not have SI at this time. Pt pleasant and cooperative.

## 2013-03-28 NOTE — ED Notes (Signed)
Patient resting in bed with no s/s of distress noted. Respirations regular and unlabored.  

## 2013-03-28 NOTE — Consult Note (Signed)
Note reviewed and agreed with  

## 2013-03-28 NOTE — ED Notes (Signed)
Pt denies SI or plans to harm herself at this time. Pt states she is willing to give the outpatient setting a try at [facility].

## 2013-03-28 NOTE — Discharge Instructions (Signed)
Helping Someone Who Is Suicidal Take threats of suicide seriously. Listen to a suicidal person's thoughts and concerns with compassion. The fact that the person is talking to you is an important sign that he or she trusts you. Reasons for suicide can depend on where we are in life.   The younger person is often depressed over lost love.  The middle-aged person is often depressed over financial problems.  The elderly person is often depressed over health problems. SIGNS IN FAMILY OR FRIENDS WHO ARE SUICIDAL INCLUDE:  Depression which suddenly gets better. Getting over depression is usually a gradual process. A sudden change may mean the person has suddenly thought of suicide as a "solution."  A sudden loss of interest in family and friends and social withdrawal.  Loss of personal hygiene habits and not caring for himself or herself.  Decline in handling of school, work, or other activities.  Injuries which are self-inflicted, such as burning or cutting.  Expressions of helplessness, hopelessness, and a sense of the loss of ability to handle life.  Risk-taking behavior, such as casual sex and drug use. COMMON SUICIDE RISKS INCLUDE:  Death or terminal illness of a relative or friend.  Broken relationships.  Loss of health.  Financial losses.  Chemical abuse (drugs and alcohol).  Previous suicide attempts. If you do not feel adequate to listen or help, ask the person if you can help him or her get help. Ask if you can share the person's concerns with someone else such as a Medical illustrator. Just talking with someone else is helpful and you can be that help by listening. Some helpful tips are:  Listen to the person's thoughts and concerns. Let the person unburden his or her troubles on you.  Let the person know you will not let him or her be alone with the pain.  Ask the person if he or she is having thoughts of hurting himself or herself.  Ask what you can do to help  lessen the pain.  Suggest that the person seek professional help and that you will assist him or her in finding help. Let the person know you will continue to be available to help. GET HELP  Contact a suicide hotline, crisis center, or local suicide prevention center for help right away. Local centers may include a hospital, clinic, community service organization, social service provider, or health department.  Call your local emergency services (911 in the [place]).  Call a suicide hotline:  1-800-273-TALK (1-717-227-0573) in the [place].  1-800-SUICIDE 323-161-0398) in the [place].  6016097196 in the [place] for Spanish-speaking counselors.  5-916-384-6KZL 864-400-7651) in the [place] for TTY users.  Visit the following websites for information and help:  National Suicide Prevention Lifeline: www.suicidepreventionlifeline.org  Hopeline: www.hopeline.[place] for Suicide Prevention: PromotionalLoans.co.za  For lesbian, gay, bisexual, transgender, or questioning youth, contact [employer]:  0-300-9-Q-ZRAQTM 709 487 2376) in the [place].  www.thetrevorproject.org  In [place], treatment resources are listed in each [place] with listings available under USAA for [employer] or similar titles. Another source for Crisis Centres by [place] is located at http://www.suicideprevention.ca/in-crisis-now/find-a-crisis-centre-now/crisis-centres Document Released: 09/06/2002 Document Revised: 05/25/2011 Document Reviewed: 11/08/2007 [facility] Patient Information 2014 [place].  Stress Management Stress is a state of physical or mental tension that often results from changes in your life or normal routine. Some common causes of stress are:  Death of a loved one.  Injuries or severe illnesses.  Getting fired or changing jobs.  Moving into a new home. Other causes may be:  Sexual problems.  Business or  financial losses.  Taking on a large debt.  Regular conflict with someone at home or at work.  Constant tiredness from lack of sleep. It is not just bad things that are stressful. It may be stressful to:  Win the lottery.  Get married.  Buy a new car. The amount of stress that can be easily tolerated varies from person to person. Changes generally cause stress, regardless of the types of change. Too much stress can affect your health. It may lead to physical or emotional problems. Too little stress (boredom) may also become stressful. SUGGESTIONS TO REDUCE STRESS:  Talk things over with your family and friends. It often is helpful to share your concerns and worries. If you feel your problem is serious, you may want to get help from a professional counselor.  Consider your problems one at a time instead of lumping them all together. Trying to take care of everything at once may seem impossible. List all the things you need to do and then start with the most important one. Set a goal to accomplish 2 or 3 things each day. If you expect to do too many in a single day you will naturally fail, causing you to feel even more stressed.  Do not use alcohol or drugs to relieve stress. Although you may feel better for a short time, they do not remove the problems that caused the stress. They can also be habit forming.  Exercise regularly - at least 3 times per week. Physical exercise can help to relieve that "uptight" feeling and will relax you.  The shortest distance between despair and hope is often a good night's sleep.  Go to bed and get up on time allowing yourself time for appointments without being rushed.  Take a short "time-out" period from any stressful situation that occurs during the day. Close your eyes and take some deep breaths. Starting with the muscles in your face, tense them, hold it for a few seconds, then relax. Repeat this with the muscles in your neck, shoulders, hand,  stomach, back and legs.  Take good care of yourself. Eat a balanced diet and get plenty of rest.  Schedule time for having fun. Take a break from your daily routine to relax. HOME CARE INSTRUCTIONS   Call if you feel overwhelmed by your problems and feel you can no longer manage them on your own.  Return immediately if you feel like hurting yourself or someone else. Document Released: 08/26/2000 Document Revised: 05/25/2011 Document Reviewed: 10/25/2012 [facility] Patient Information 2014 [place], [place].  Suicidal Feelings, How to Help Yourself Everyone feels sad or unhappy at times, but depressing thoughts and feelings of hopelessness can lead to thoughts of suicide. It can seem as if life is too tough to handle. If you feel as though you have reached the point where suicide is the only answer, it is time to let someone know immediately.  HOW TO COPE AND PREVENT SUICIDE  Let family, friends, teachers, or counselors know. Get help. Try not to isolate yourself from those who care about you. Even though you may not feel sociable, talk with someone every day. It is best if it is face-to-face. Remember, they will want to help you.  Eat a regularly spaced and well-balanced diet.  Get plenty of rest.  Avoid alcohol and drugs because they will only make you feel worse and may also lower your  inhibitions. Remove them from the home. If you are thinking of taking an overdose of your prescribed medicines, give your medicines to someone who can give them to you one day at a time. If you are on antidepressants, let your caregiver know of your feelings so he or she can provide a safer medicine, if that is a concern.  Remove weapons or poisons from your home.  Try to stick to routines. Follow a schedule and remind yourself that you have to keep that schedule every day.  Set some realistic goals and achieve them. Make a list and cross things off as you go. Accomplishments give a sense of worth. Wait  until you are feeling better before doing things you find difficult or unpleasant to do.  If you are able, try to start exercising. Even half-hour periods of exercise each day will make you feel better. Getting out in the sun or into nature helps you recover from depression faster. If you have a favorite place to walk, take advantage of that.  Increase safe activities that have always given you pleasure. This may include playing your favorite music, reading a good book, painting a picture, or playing your favorite instrument. Do whatever takes your mind off your depression.  Keep your living space well-lighted. GET HELP Contact a suicide hotline, crisis center, or local suicide prevention center for help right away. Local centers may include a hospital, clinic, community service organization, social service provider, or health department.  Call your local emergency services (911 in the [place]).  Call a suicide hotline:  1-800-273-TALK (1-(313)787-1750) in the [place].  1-800-SUICIDE 587-761-3414) in the [place].  (917)684-4052 in the [place] for Spanish-speaking counselors.  2-122-482-5OIB 938-143-9313) in the [place] for TTY users.  Visit the following websites for information and help:  National Suicide Prevention Lifeline: www.suicidepreventionlifeline.org  Hopeline: www.hopeline.[place] for Suicide Prevention: PromotionalLoans.co.za  For lesbian, gay, bisexual, transgender, or questioning youth, contact [employer]:  5-038-8-E-KCMKLK (445)496-4322) in the [place].  www.thetrevorproject.org  In [place], treatment resources are listed in each [place] with listings available under USAA for [employer] or similar titles. Another source for Crisis Centres by [place] is located at http://www.suicideprevention.ca/in-crisis-now/find-a-crisis-centre-now/crisis-centres Document Released: 09/06/2002 Document  Revised: 05/25/2011 Document Reviewed: 01/25/2007 [facility] Patient Information 2014 [place], [place].  Mood Disorders Mood disorders are conditions that affect the way a person feels emotionally. The main mood disorders include:  Depression.  Bipolar disorder.  Dysthymia. Dysthymia is a mild, lasting (chronic) depression. Symptoms of dysthymia are similar to depression, but not as severe.  Cyclothymia. Cyclothymia includes mood swings, but the highs and lows are not as severe as they are in bipolar disorder. Symptoms of cyclothymia are similar to those of bipolar disorder, but less extreme. CAUSES  Mood disorders are probably caused by a combination of factors. People with mood disorders seem to have physical and chemical changes in their brains. Mood disorders run in families, so there may be genetic causes. Severe trauma or stressful life events may also increase the risk of mood disorders.  SYMPTOMS  Symptoms of mood disorders depend on the specific type of condition. Depression symptoms include:  Feeling sad, worthless, or hopeless.  Negative thoughts.  Inability to enjoy one's usual activities.  Low energy.  Sleeping too much or too little.  Appetite changes.  Crying.  Concentration problems.  Thoughts of harming oneself. Bipolar disorder symptoms include:  Periods of depression (see above symptoms).  Mood swings, from sadness  and depression, to abnormal elation and excitement.  Periods of mania:  Racing thoughts.  Fast speech.  Poor judgment, and careless, dangerous choices.  Decreased need for sleep.  Risky behavior.  Difficulty concentrating.  Irritability.  Increased energy.  Increased sex drive. DIAGNOSIS  There are no blood tests or X-rays that can confirm a mood disorder. However, your caregiver may choose to run some tests to make sure that there is not another physical cause for your symptoms. A mood disorder is usually diagnosed after an  in-depth interview with a caregiver. TREATMENT  Mood disorders can be treated with one or more of the following:  Medicine. This may include antidepressants, mood-stabilizers, or anti-psychotics.  Psychotherapy (talk therapy).  Cognitive behavioral therapy. You are taught to recognize negative thoughts and behavior patterns, and replace them with healthy thoughts and behaviors.  Electroconvulsive therapy. For very severe cases of deep depression, a series of treatments in which an electrical current is applied to the brain.  Vagus nerve stimulation. A pulse of electricity is applied to a portion of the brain.  Transcranial magnetic stimulation. Powerful magnets are placed on the head that produce electrical currents.  Hospitalization. In severe situations, or when someone is having serious thoughts of harming him or herself, hospitalization may be necessary in order to keep the person safe. This is also done to quickly start and monitor treatment. HOME CARE INSTRUCTIONS   Take your medicine exactly as directed.  Attend all of your therapy sessions.  Try to eat regular, healthy meals.  Exercise daily. Exercise may improve mood symptoms.  Get good sleep.  Do not drink alcohol or use pot or other drugs. These can worsen mood symptoms and cause anxiety and psychosis.  Tell your caregiver if you develop any side effects, such as feeling sick to your stomach (nauseous), dry mouth, dizziness, constipation, drowsiness, tremor, weight gain, or sexual symptoms. He or she may suggest things you can do to improve symptoms.  Learn ways to cope with the stress of having a chronic illness. This includes yoga, meditation, tai chi, or participating in a support group.  Drink enough water to keep your urine clear or pale yellow. Eat a high-fiber diet. These habits may help you avoid constipation from your medicine. SEEK IMMEDIATE MEDICAL CARE IF:  Your mood worsens.  You have thoughts of  hurting yourself or others.  You cannot care for yourself.  You develop the sensation of hearing or seeing something that is not actually present (auditory or visual hallucinations).  You develop abnormal thoughts. Document Released: 12/28/2008 Document Revised: 05/25/2011 Document Reviewed: 12/28/2008 [facility] Patient Information 2014 [place], [place].  Mood Disorders Mood disorders are conditions that affect the way a person feels emotionally. The main mood disorders include:  Depression.  Bipolar disorder.  Dysthymia. Dysthymia is a mild, lasting (chronic) depression. Symptoms of dysthymia are similar to depression, but not as severe.  Cyclothymia. Cyclothymia includes mood swings, but the highs and lows are not as severe as they are in bipolar disorder. Symptoms of cyclothymia are similar to those of bipolar disorder, but less extreme. CAUSES  Mood disorders are probably caused by a combination of factors. People with mood disorders seem to have physical and chemical changes in their brains. Mood disorders run in families, so there may be genetic causes. Severe trauma or stressful life events may also increase the risk of mood disorders.  SYMPTOMS  Symptoms of mood disorders depend on the specific type of condition. Depression symptoms include:  Feeling  sad, worthless, or hopeless.  Negative thoughts.  Inability to enjoy one's usual activities.  Low energy.  Sleeping too much or too little.  Appetite changes.  Crying.  Concentration problems.  Thoughts of harming oneself. Bipolar disorder symptoms include:  Periods of depression (see above symptoms).  Mood swings, from sadness and depression, to abnormal elation and excitement.  Periods of mania:  Racing thoughts.  Fast speech.  Poor judgment, and careless, dangerous choices.  Decreased need for sleep.  Risky behavior.  Difficulty concentrating.  Irritability.  Increased energy.  Increased sex  drive. DIAGNOSIS  There are no blood tests or X-rays that can confirm a mood disorder. However, your caregiver may choose to run some tests to make sure that there is not another physical cause for your symptoms. A mood disorder is usually diagnosed after an in-depth interview with a caregiver. TREATMENT  Mood disorders can be treated with one or more of the following:  Medicine. This may include antidepressants, mood-stabilizers, or anti-psychotics.  Psychotherapy (talk therapy).  Cognitive behavioral therapy. You are taught to recognize negative thoughts and behavior patterns, and replace them with healthy thoughts and behaviors.  Electroconvulsive therapy. For very severe cases of deep depression, a series of treatments in which an electrical current is applied to the brain.  Vagus nerve stimulation. A pulse of electricity is applied to a portion of the brain.  Transcranial magnetic stimulation. Powerful magnets are placed on the head that produce electrical currents.  Hospitalization. In severe situations, or when someone is having serious thoughts of harming him or herself, hospitalization may be necessary in order to keep the person safe. This is also done to quickly start and monitor treatment. HOME CARE INSTRUCTIONS   Take your medicine exactly as directed.  Attend all of your therapy sessions.  Try to eat regular, healthy meals.  Exercise daily. Exercise may improve mood symptoms.  Get good sleep.  Do not drink alcohol or use pot or other drugs. These can worsen mood symptoms and cause anxiety and psychosis.  Tell your caregiver if you develop any side effects, such as feeling sick to your stomach (nauseous), dry mouth, dizziness, constipation, drowsiness, tremor, weight gain, or sexual symptoms. He or she may suggest things you can do to improve symptoms.  Learn ways to cope with the stress of having a chronic illness. This includes yoga, meditation, tai chi, or  participating in a support group.  Drink enough water to keep your urine clear or pale yellow. Eat a high-fiber diet. These habits may help you avoid constipation from your medicine. SEEK IMMEDIATE MEDICAL CARE IF:  Your mood worsens.  You have thoughts of hurting yourself or others.  You cannot care for yourself.  You develop the sensation of hearing or seeing something that is not actually present (auditory or visual hallucinations).  You develop abnormal thoughts. Document Released: 12/28/2008 Document Revised: 05/25/2011 Document Reviewed: 12/28/2008 [facility] Patient Information 2014 [place], [place].  Depression, Adult Depression refers to feeling sad, low, down in the dumps, blue, gloomy, or empty. In general, there are two kinds of depression: 1. Depression that we all experience from time to time because of upsetting life experiences, including the loss of a job or the ending of a relationship (normal sadness or normal grief). This kind of depression is considered normal, is short lived, and resolves within a few days to 2 weeks. (Depression experienced after the loss of a loved one is called bereavement. Bereavement often lasts longer than 2 weeks but  normally gets better with time.) 2. Clinical depression, which lasts longer than normal sadness or normal grief or interferes with your ability to function at home, at work, and in school. It also interferes with your personal relationships. It affects almost every aspect of your life. Clinical depression is an illness. Symptoms of depression also can be caused by conditions other than normal sadness and grief or clinical depression. Examples of these conditions are listed as follows:  Physical illness Some physical illnesses, including underactive thyroid gland (hypothyroidism), severe anemia, specific types of cancer, diabetes, uncontrolled seizures, heart and lung problems, strokes, and chronic pain are commonly associated with  symptoms of depression.  Side effects of some prescription medicine In some people, certain types of prescription medicine can cause symptoms of depression.  Substance abuse Abuse of alcohol and illicit drugs can cause symptoms of depression. SYMPTOMS Symptoms of normal sadness and normal grief include the following:  Feeling sad or crying for short periods of time.  Not caring about anything (apathy).  Difficulty sleeping or sleeping too much.  No longer able to enjoy the things you used to enjoy.  Desire to be by oneself all the time (social isolation).  Lack of energy or motivation.  Difficulty concentrating or remembering.  Change in appetite or weight.  Restlessness or agitation. Symptoms of clinical depression include the same symptoms of normal sadness or normal grief and also the following symptoms:  Feeling sad or crying all the time.  Feelings of guilt or worthlessness.  Feelings of hopelessness or helplessness.  Thoughts of suicide or the desire to harm yourself (suicidal ideation).  Loss of touch with reality (psychotic symptoms). Seeing or hearing things that are not real (hallucinations) or having false beliefs about your life or the people around you (delusions and paranoia). DIAGNOSIS  The diagnosis of clinical depression usually is based on the severity and duration of the symptoms. Your caregiver also will ask you questions about your medical history and substance use to find out if physical illness, use of prescription medicine, or substance abuse is causing your depression. Your caregiver also may order blood tests. TREATMENT  Typically, normal sadness and normal grief do not require treatment. However, sometimes antidepressant medicine is prescribed for bereavement to ease the depressive symptoms until they resolve. The treatment for clinical depression depends on the severity of your symptoms but typically includes antidepressant medicine, counseling with  a mental health professional, or a combination of both. Your caregiver will help to determine what treatment is best for you. Depression caused by physical illness usually goes away with appropriate medical treatment of the illness. If prescription medicine is causing depression, talk with your caregiver about stopping the medicine, decreasing the dose, or substituting another medicine. Depression caused by abuse of alcohol or illicit drugs abuse goes away with abstinence from these substances. Some adults need professional help in order to stop drinking or using drugs. SEEK IMMEDIATE CARE IF:  You have thoughts about hurting yourself or others.  You lose touch with reality (have psychotic symptoms).  You are taking medicine for depression and have a serious side effect. FOR MORE INFORMATION National Alliance on Mental Illness: www.nami.[employer] of Mental Health: https://carter.com/ Document Released: 02/28/2000 Document Revised: 09/01/2011 Document Reviewed: 06/01/2011 [facility] Patient Information 2014 [place].

## 2013-03-28 NOTE — ED Notes (Signed)
Pt verbalizes an understanding of her follow-up appointments and resources. Pt's belongings returned to her.

## 2013-03-28 NOTE — Consult Note (Signed)
[facility] Follow up Psychiatry Consult   Reason for Consult:  Insomnia Referring Physician:  EDP Wendy Duffy is an 38 y.o. female.  Assessment: AXIS I:  Major Depressive Disorder, moderate AXIS II:  Deferred AXIS III:   Past Medical History  Diagnosis Date  . Hypertension   . Hemorrhoids    AXIS IV:  economic problems, housing problems, occupational problems, other psychosocial or environmental problems, problems related to legal system/crime, problems related to social environment, problems with access to health care services and problems with primary support group AXIS V:  61-70 mild symptoms  Plan:  No evidence of imminent risk to self or others at present.   Supportive therapy provided about ongoing stressors. Refer to IOP. Discussed crisis plan, support from social network, calling 911, coming to the Emergency Department, and calling Suicide Hotline.  Subjective:   Wendy Duffy is a 38 y.o. female evaluated with insomnia.   Patient states that she continues to be depressed and wants to be set up with therapy and learn ways to deal with her depression.  Patient denies suicidal ideation, homicidal ideation, psychosis, and paranoia.  Patient states that she does however "feel that if I am no longer here my family will be better off.  But I am not going to kill my self."  Discussed intensive outpatient services (IOP) with patient and she was in agreement with type of services.  Patient also states that she was having difficulty sleeping but is better since sleep last night.  HPI Elements:   Location:  generalized. Quality:  chronic. Severity:  mild. Timing:  constant. Duration:  past week. Context:  stressors.  Past Psychiatric History: Past Medical History  Diagnosis Date  . Hypertension   . Hemorrhoids     reports that she has never smoked. She does not have any smokeless tobacco history on file. She reports that she drinks alcohol. She reports that she uses illicit drugs  (Marijuana). History reviewed. No pertinent family history.         Allergies:   Allergies  Allergen Reactions  . Zoloft [Sertraline Hcl]     Made her feel very anxious     ACT Assessment Complete:  Yes:    Educational Status    Risk to Self: Risk to self Is patient at risk for suicide?: Yes Substance abuse history and/or treatment for substance abuse?: No  Risk to Others:    Abuse:    Prior Inpatient Therapy:    Prior Outpatient Therapy:    Additional Information:     Objective: Blood pressure 125/78, pulse 76, temperature 97.9 F (36.6 C), temperature source Oral, resp. rate 18, last menstrual period 02/27/2013, SpO2 98.00%.There is no weight on file to calculate BMI. Results for orders placed during the hospital encounter of 03/27/13 (from the past 72 hour(s))  ACETAMINOPHEN LEVEL     Status: None   Collection Time    03/27/13 10:40 AM      Result Value Range   Acetaminophen (Tylenol), Serum <15.0  10 - 30 ug/mL   Comment:            THERAPEUTIC CONCENTRATIONS VARY     SIGNIFICANTLY. A RANGE OF 10-30     ug/mL MAY BE AN EFFECTIVE     CONCENTRATION FOR MANY PATIENTS.     HOWEVER, SOME ARE BEST TREATED     AT CONCENTRATIONS OUTSIDE THIS     RANGE.     ACETAMINOPHEN CONCENTRATIONS     >150 ug/mL AT  4 HOURS AFTER     INGESTION AND >50 ug/mL AT 12     HOURS AFTER INGESTION ARE     OFTEN ASSOCIATED WITH TOXIC     REACTIONS.  CBC     Status: None   Collection Time    03/27/13 10:40 AM      Result Value Range   WBC 5.4  4.0 - 10.5 K/uL   RBC 4.75  3.87 - 5.11 MIL/uL   Hemoglobin 13.4  12.0 - 15.0 g/dL   HCT 40.0  36.0 - 46.0 %   MCV 84.2  78.0 - 100.0 fL   MCH 28.2  26.0 - 34.0 pg   MCHC 33.5  30.0 - 36.0 g/dL   RDW 13.0  11.5 - 15.5 %   Platelets 315  150 - 400 K/uL  COMPREHENSIVE METABOLIC PANEL     Status: Abnormal   Collection Time    03/27/13 10:40 AM      Result Value Range   Sodium 136 (*) 137 - 147 mEq/L   Potassium 3.8  3.7 - 5.3 mEq/L   Chloride  102  96 - 112 mEq/L   CO2 25  19 - 32 mEq/L   Glucose, Bld 91  70 - 99 mg/dL   BUN 11  6 - 23 mg/dL   Creatinine, Ser 0.89  0.50 - 1.10 mg/dL   Calcium 8.8  8.4 - 10.5 mg/dL   Total Protein 6.7  6.0 - 8.3 g/dL   Albumin 3.4 (*) 3.5 - 5.2 g/dL   AST 19  0 - 37 U/L   ALT 21  0 - 35 U/L   Alkaline Phosphatase 59  39 - 117 U/L   Total Bilirubin 0.4  0.3 - 1.2 mg/dL   GFR calc non Af Amer 82 (*) >90 mL/min   GFR calc Af Amer >90  >90 mL/min   Comment: (NOTE)     The eGFR has been calculated using the CKD EPI equation.     This calculation has not been validated in all clinical situations.     eGFR's persistently <90 mL/min signify possible Chronic Kidney     Disease.  ETHANOL     Status: None   Collection Time    03/27/13 10:40 AM      Result Value Range   Alcohol, Ethyl (B) <11  0 - 11 mg/dL   Comment:            LOWEST DETECTABLE LIMIT FOR     SERUM ALCOHOL IS 11 mg/dL     FOR MEDICAL PURPOSES ONLY  SALICYLATE LEVEL     Status: Abnormal   Collection Time    03/27/13 10:40 AM      Result Value Range   Salicylate Lvl <6.2 (*) 2.8 - 20.0 mg/dL  URINE RAPID DRUG SCREEN (HOSP PERFORMED)     Status: None   Collection Time    03/27/13 10:45 AM      Result Value Range   Opiates NONE DETECTED  NONE DETECTED   Cocaine NONE DETECTED  NONE DETECTED   Benzodiazepines NONE DETECTED  NONE DETECTED   Amphetamines NONE DETECTED  NONE DETECTED   Tetrahydrocannabinol NONE DETECTED  NONE DETECTED   Barbiturates NONE DETECTED  NONE DETECTED   Comment:            DRUG SCREEN FOR MEDICAL PURPOSES     ONLY.  IF CONFIRMATION IS NEEDED     FOR ANY PURPOSE, NOTIFY LAB  WITHIN 5 DAYS.                LOWEST DETECTABLE LIMITS     FOR URINE DRUG SCREEN     Drug Class       Cutoff (ng/mL)     Amphetamine      1000     Barbiturate      200     Benzodiazepine   741     Tricyclics       287     Opiates          300     Cocaine          300     THC              50  POCT PREGNANCY, URINE      Status: None   Collection Time    03/27/13 10:50 AM      Result Value Range   Preg Test, Ur NEGATIVE  NEGATIVE   Comment:            THE SENSITIVITY OF THIS     METHODOLOGY IS >24 mIU/mL   Labs are reviewed and are pertinent for nothing.  Current Facility-Administered Medications  Medication Dose Route Frequency Provider Last Rate Last Dose  . amLODipine (NORVASC) tablet 10 mg  10 mg Oral Daily Marissa Sciacca, PA-C   10 mg at 03/28/13 0946  . buPROPion [facility]) tablet 100 mg  100 mg Oral Daily Marissa Sciacca, PA-C   100 mg at 03/28/13 0947  . busPIRone (BUSPAR) tablet 7.5 mg  7.5 mg Oral BID Marissa Sciacca, PA-C   7.5 mg at 03/28/13 0946  . hydrochlorothiazide (HYDRODIURIL) tablet 25 mg  25 mg Oral Daily Marissa Sciacca, PA-C   25 mg at 03/28/13 0946  . hydrOXYzine (ATARAX/VISTARIL) tablet 100 mg  100 mg Oral TID PRN Madison Hickman, NP      . hydrOXYzine (ATARAX/VISTARIL) tablet 50 mg  50 mg Oral TID PRN Marissa Sciacca, PA-C      . ibuprofen (ADVIL,MOTRIN) tablet 600 mg  600 mg Oral Q6H PRN Marissa Sciacca, PA-C      . LORazepam (ATIVAN) tablet 1 mg  1 mg Oral Q8H PRN Marissa Sciacca, PA-C   1 mg at 03/27/13 2206  . losartan (COZAAR) tablet 25 mg  25 mg Oral Daily Marissa Sciacca, PA-C   25 mg at 03/28/13 0946  . ondansetron (ZOFRAN) tablet 4 mg  4 mg Oral Q8H PRN Marissa Sciacca, PA-C      . pantoprazole (PROTONIX) EC tablet 80 mg  80 mg Oral Daily Marissa Sciacca, PA-C   80 mg at 03/28/13 8676   Current Outpatient Prescriptions  Medication Sig Dispense Refill  . amLODipine (NORVASC) 10 MG tablet take 1 tablet by mouth once daily  90 tablet  1  . Aspirin-Acetaminophen-Caffeine (GOODY HEADACHE PO) Take 1 packet by mouth once.      Marland Kitchen buPROPion (WELLBUTRIN) 100 MG tablet take 1 tablet by mouth twice a day  60 tablet  1  . busPIRone (BUSPAR) 7.5 MG tablet Take 1 tablet (7.5 mg total) by mouth 2 (two) times daily.  60 tablet  1  . camphor-menthol (SARNA) lotion Apply topically as  needed for itching.  222 mL  0  . hydrochlorothiazide (HYDRODIURIL) 25 MG tablet take 1 tablet by mouth once daily  90 tablet  0  . hydrOXYzine (ATARAX/VISTARIL) 50 MG tablet Take 1 tablet (50 mg total) by mouth 3 (three) times  daily as needed for itching.  30 tablet  0  . ibuprofen (ADVIL,MOTRIN) 200 MG tablet Take 600 mg by mouth every 6 (six) hours as needed for moderate pain.      Marland Kitchen losartan (COZAAR) 25 MG tablet take 1 tablet by mouth once daily  90 tablet  1  . omeprazole (PRILOSEC) 40 MG capsule take 1 capsule by mouth once daily  30 capsule  5   Psychiatric Specialty Exam:     Blood pressure 125/78, pulse 76, temperature 97.9 F (36.6 C), temperature source Oral, resp. rate 18, last menstrual period 02/27/2013, SpO2 98.00%.There is no weight on file to calculate BMI.  General Appearance: Casual, Disheveled and Guarded  Eye Contact::  Fair  Speech:  Clear and Coherent and Slow  Volume:  Decreased  Mood:  Anxious, Depressed and Hopeless  Affect:  Restricted  Thought Process:  Circumstantial, Goal Directed and Logical  Orientation:  Full (Time, Place, and Person)  Thought Content:  Rumination  Suicidal Thoughts:  No  Homicidal Thoughts:  No  Memory:  Immediate;   Fair Recent;   Fair Remote;   Fair  Judgement:  Fair  Insight:  Lacking and Shallow  Psychomotor Activity:  Normal  Concentration:  Fair  Recall:  Fair  Akathisia:  No  Handed:  Right  AIMS (if indicated):   0  Assets:  Desire for Improvement Physical Health Resilience  Sleep:   poor  Face to face consult with Dr. Lovena Le  Treatment Plan Summary: IOP referral.   Disposition:  Discharge home.  Patient to follow up with [facility] IOP program.  Prescription for Vistaril 25 mg Q hs.     Discharge Assessment     Demographic Factors:  Black single female  Mental Status Per Nursing Assessment::   On Admission:     Current Mental Status by Physician: Denies suicidal/homicidal ideation, psychosis, and  paranoia  Loss Factors: Denies any factors of loss  Historical Factors: Chronic depression  Risk Reduction Factors:   Responsible for children under 70 years of age, Sense of responsibility to family, Religious beliefs about death, Positive therapeutic relationship and Positive coping skills or problem solving skills  Continued Clinical Symptoms:  Depression:   Hopelessness Insomnia  Cognitive Features That Contribute To Risk:  Polarized thinking    Suicide Risk:  Minimal: No identifiable suicidal ideation.  Patients presenting with no risk factors but with morbid ruminations; may be classified as minimal risk based on the severity of the depressive symptoms  Discharge Diagnoses:   AXIS I:  Major Depressive Disorder AXIS II:  Deferred AXIS III:   Past Medical History  Diagnosis Date  . Hypertension   . Hemorrhoids    AXIS IV:  occupational problems and problems related to social environment AXIS V:  61-70 mild symptoms  Plan Of Care/Follow-up recommendations:  Activity:  No limitations Diet:  Regular  Is patient on multiple antipsychotic therapies at discharge:  No   Has Patient had three or more failed trials of antipsychotic monotherapy by history:  No  Recommended Plan for Multiple Antipsychotic Therapies: NA  Arlis Yale FNP-BC 03/28/2013 11:23 AM

## 2013-03-28 NOTE — Telephone Encounter (Signed)
Pt called and would like someone to call her concerning her discharge from the hospital and the medication that she is taking. jw

## 2013-03-28 NOTE — Progress Notes (Signed)
Per Estill Bamberg, patient will discharged in the a.m. after psych evaluation with prescriptions.

## 2013-03-30 ENCOUNTER — Other Ambulatory Visit (HOSPITAL_COMMUNITY): Payer: BC Managed Care – PPO | Attending: Psychiatry | Admitting: Psychiatry

## 2013-03-30 ENCOUNTER — Ambulatory Visit (INDEPENDENT_AMBULATORY_CARE_PROVIDER_SITE_OTHER): Payer: BC Managed Care – PPO | Admitting: Family Medicine

## 2013-03-30 ENCOUNTER — Encounter (HOSPITAL_COMMUNITY): Payer: Self-pay

## 2013-03-30 ENCOUNTER — Encounter: Payer: Self-pay | Admitting: Family Medicine

## 2013-03-30 VITALS — BP 133/89 | HR 89 | Ht 61.0 in | Wt 193.0 lb

## 2013-03-30 DIAGNOSIS — F411 Generalized anxiety disorder: Secondary | ICD-10-CM | POA: Insufficient documentation

## 2013-03-30 DIAGNOSIS — F419 Anxiety disorder, unspecified: Secondary | ICD-10-CM

## 2013-03-30 DIAGNOSIS — F32A Depression, unspecified: Secondary | ICD-10-CM

## 2013-03-30 DIAGNOSIS — F331 Major depressive disorder, recurrent, moderate: Secondary | ICD-10-CM

## 2013-03-30 DIAGNOSIS — F329 Major depressive disorder, single episode, unspecified: Secondary | ICD-10-CM

## 2013-03-30 DIAGNOSIS — F332 Major depressive disorder, recurrent severe without psychotic features: Secondary | ICD-10-CM | POA: Insufficient documentation

## 2013-03-30 DIAGNOSIS — F3289 Other specified depressive episodes: Secondary | ICD-10-CM

## 2013-03-30 NOTE — Progress Notes (Signed)
    Daily Group Progress Note  Program: IOP  Group Time: 9:00-10:30 am   Participation Level: None  Behavioral Response: none  Type of Therapy:  none  Summary of Progress: Pt participated in the initial assessment and orientation with the case manager for her first day in the program.      Group Time: 10:30 am - 12:00 pm   Participation Level:  None  Behavioral Response: none  Type of Therapy: none  Summary of Progress: Pt met with the doctor for the initial medical assessment.   Tresa Res, LCSW
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
## 2013-03-30 NOTE — Progress Notes (Signed)
Patient ID: Wendy Duffy, female   DOB: [birth year], 38 y.o.   MRN: [id number] S:   38 yo with hx of MDD and anxiety who presents today for FMLA paperwork.  - states was on the wellbutrin and buspar but still had trouble sleeping - got to a point where last week she only slept 3 hours - seen 03/27/13 in ED and had an overnight admission with behavioral health - "best thing ever" - set up for behavioral classes and starts tomorrow with 3 weeks of group work daily.  - looking forward to it - was changed to vystaril and wellbutrin - needs FMLA paperwork filled out for intermittent leave for depression.  - was filled out last year by Dr. Verdie Drown.   O:  Filed Vitals:   03/30/13 1413  BP: 133/89  Pulse: 89   GEN: well appearing, NAD PSYCH: markedly improved affect from last visit although still somewhat flat  A/P Anxiety  Depression   P:  -FMLA paperwork filled out for intermittent leave and given to pt -copy scanned into chart - praised for the initiative she took in getting help - recommend she f/u with PCP in 1-2 months to check in.

## 2013-03-30 NOTE — Patient Instructions (Signed)
Congratulations on all the hard work you have done to get yourself on the right track.  Thanks for taking care of yourself Please let us know if we can help in any way Your forms are filled out for you as requested.

## 2013-03-31 ENCOUNTER — Other Ambulatory Visit (HOSPITAL_COMMUNITY): Payer: BC Managed Care – PPO | Admitting: Psychiatry

## 2013-03-31 DIAGNOSIS — F419 Anxiety disorder, unspecified: Secondary | ICD-10-CM

## 2013-03-31 DIAGNOSIS — F329 Major depressive disorder, single episode, unspecified: Secondary | ICD-10-CM

## 2013-03-31 DIAGNOSIS — F32A Depression, unspecified: Secondary | ICD-10-CM

## 2013-03-31 NOTE — Progress Notes (Signed)
    Daily Group Progress Note  Program: IOP  Group Time: 9:00-10:30 am   Participation Level: Active  Behavioral Response: Appropriate  Type of Therapy:  Process Group  Summary of Progress: Today was Pts first full day in the group. Pt talked about her high depression and anxiety symptoms associated with her high job stress. Pt said the stress became so unbearable that she could not function at home or at work. Pt said she is trying to make a job transition internally, but fears being off from work could impact their decision to relocate her. Pt processed fears and related to other members with similar job stressors. Pt said it helped to "not feel so alone" in her struggles.      Group Time: 10:30 am - 12:00 pm   Participation Level:  Active  Behavioral Response: Appropriate  Type of Therapy: Psycho-education Group  Summary of Progress: Pt was introduced to the DBT skill of distress tolerance and learned about emotional mind and unhealthy coping skills that lead to creating greater crisis.   Tresa Res, LCSW

## 2013-04-03 ENCOUNTER — Other Ambulatory Visit (HOSPITAL_COMMUNITY): Payer: BC Managed Care – PPO | Admitting: Psychiatry

## 2013-04-03 DIAGNOSIS — F32A Depression, unspecified: Secondary | ICD-10-CM

## 2013-04-03 DIAGNOSIS — F419 Anxiety disorder, unspecified: Secondary | ICD-10-CM

## 2013-04-03 DIAGNOSIS — F329 Major depressive disorder, single episode, unspecified: Secondary | ICD-10-CM

## 2013-04-03 NOTE — Progress Notes (Signed)
    Daily Group Progress Note  Program: IOP  Group Time: 9:00-10:30 am   Participation Level: Active  Behavioral Response: Appropriate  Type of Therapy:  Process Group  Summary of Progress: Pt was tearful as she described high anxiety. Pt talked about how she does not know how to slow down and do things for herself as she is constantly caught up in doing for her family and others. She feels the guilt she has constantly over not doing enough for them contributes to her depression.      Group Time: 10:30 am - 12:00 pm   Participation Level:  Active  Behavioral Response: Appropriate  Type of Therapy: Psycho-education Group  Summary of Progress: Pt participated in a group with a focus on grief and loss and identified current losses impacting overall wellness.   Tresa Res, LCSW

## 2013-04-04 ENCOUNTER — Other Ambulatory Visit (HOSPITAL_COMMUNITY): Payer: BC Managed Care – PPO | Admitting: Psychiatry

## 2013-04-04 DIAGNOSIS — F32A Depression, unspecified: Secondary | ICD-10-CM

## 2013-04-04 DIAGNOSIS — F419 Anxiety disorder, unspecified: Secondary | ICD-10-CM

## 2013-04-04 DIAGNOSIS — F329 Major depressive disorder, single episode, unspecified: Secondary | ICD-10-CM

## 2013-04-05 ENCOUNTER — Other Ambulatory Visit (HOSPITAL_COMMUNITY): Payer: BC Managed Care – PPO | Admitting: Psychiatry

## 2013-04-05 DIAGNOSIS — F419 Anxiety disorder, unspecified: Secondary | ICD-10-CM

## 2013-04-05 DIAGNOSIS — F32A Depression, unspecified: Secondary | ICD-10-CM

## 2013-04-05 DIAGNOSIS — F329 Major depressive disorder, single episode, unspecified: Secondary | ICD-10-CM
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
## 2013-04-05 NOTE — Progress Notes (Signed)
    Daily Group Progress Note  Program: IOP  Group Time: 9:00-10:30 am   Participation Level: Active  Behavioral Response: Appropriate  Type of Therapy:  Process Group  Summary of Progress: Pt reports improved mood. Pt learned the skill of Mindfulness and identified how to use this skill to manage personal symptoms of depression, anxiety and stress.      Group Time: 10:30 am - 12:00 pm   Participation Level:  Active  Behavioral Response: Appropriate  Type of Therapy: Psycho-education Group  Summary of Progress:  Pt practiced the skill of mindfulness using a guided mindfulness meditation and reported a significant reduce in stress following this activity. Pt then talked about how they would use the skill going forward to manage personal stressors.    Zikeria Keough E, LCSW 

## 2013-04-05 NOTE — Progress Notes (Signed)
    Daily Group Progress Note  Program: IOP  Group Time: 9:00-10:30 am   Participation Level: Active  Behavioral Response: Appropriate  Type of Therapy:  Process Group  Summary of Progress: Pt continues to have racing, worrisome thoughts about several things in her life, including her job, that are preventing her from focusing and fully using her coping skills. Writer shared this with the doctor and medications are being explored as an option. Pt still worries about returning to her job and being able to function. Pt also has high guilt towards feeling like she is letting others down.      Group Time: 10:30 am - 12:00 pm   Participation Level:  Active  Behavioral Response: Appropriate  Type of Therapy: Psycho-education Group  Summary of Progress: Pt learned he DBT skill of distress tolerance and how to use it to mange difficult feelings and situations that cause distress.   Tresa Res, LCSW

## 2013-04-06 ENCOUNTER — Other Ambulatory Visit (HOSPITAL_COMMUNITY): Payer: BC Managed Care – PPO

## 2013-04-07 ENCOUNTER — Other Ambulatory Visit (HOSPITAL_COMMUNITY): Payer: BC Managed Care – PPO

## 2013-04-07 ENCOUNTER — Telehealth (HOSPITAL_COMMUNITY): Payer: Self-pay | Admitting: Psychiatry

## 2013-04-10 ENCOUNTER — Other Ambulatory Visit (HOSPITAL_COMMUNITY): Payer: BC Managed Care – PPO | Admitting: Psychiatry

## 2013-04-10 DIAGNOSIS — F32A Depression, unspecified: Secondary | ICD-10-CM

## 2013-04-10 DIAGNOSIS — F419 Anxiety disorder, unspecified: Secondary | ICD-10-CM

## 2013-04-10 DIAGNOSIS — F329 Major depressive disorder, single episode, unspecified: Secondary | ICD-10-CM

## 2013-04-10 MED ORDER — RISPERIDONE 1 MG PO TABS: 1.0000 mg | ORAL_TABLET | Freq: Two times a day (BID) | ORAL | Status: DC

## 2013-04-10 MED ORDER — BUPROPION HCL ER (XL) 300 MG PO TB24: 300.0000 mg | ORAL_TABLET | ORAL | Status: DC

## 2013-04-10 NOTE — Progress Notes (Signed)
    Daily Group Progress Note  Program: IOP  Group Time: 9:00-10:30 am   Participation Level: Active  Behavioral Response: Appropriate  Type of Therapy:  Process Group  Summary of Progress: Pt worked on her goal of anxiety and depression today by sharing her difficulty with expressing her needs to others in an effective way. Pt said she either tends to not share how she feels or she becomes angry and abrasive with others, which is getting her in trouble at work. Pt is being encouraged to practice being more assertive within a group setting and is exploring situations both at work and at home where she can communicate more directly. Pt reports improved mood and self-confidence, but requested more time in the group.      Group Time: 10:30 am - 12:00 pm   Participation Level:  Active  Behavioral Response: Appropriate  Type of Therapy: Psycho-education Group  Summary of Progress: Pt continued learning about effective boundary setting and ways to set healthier limits with others to ensure wellness.   Tresa Res, LCSW

## 2013-04-10 NOTE — Progress Notes (Signed)
Patient ID: Wendy Duffy, female   DOB: 07-20-1975, 38 y.o.   MRN: [id number] Patient was reviewed with case manager Dellia Nims and then interviewed. Patient is very distraught tearful anxious has difficulty concentrating feels overwhelmed. States her depression has worsened and she is unable to sleep and has been experiencing anxiety. Patient also states that her work wants her to return tomorrow as they did not get any notification regarding the disability paperwork that we had completed. Discussed that they'll fax over a letter to her work stating the need for her to stay in IOP longer and she stated understanding. I discussed the rationale risks benefits options off Risperdal 0.5 mg twice a day. for mood stabilization and patient gave me her informed consent also discussed switching her from regular Wellbutrin to Wellbutrin XL 300 mg and patient is okay with that. Patient will continue her other medications. Denies suicidal or homicidal ideation and denies hallucinations or delusions.

## 2013-04-11 ENCOUNTER — Other Ambulatory Visit (HOSPITAL_COMMUNITY): Payer: BC Managed Care – PPO | Admitting: Psychiatry

## 2013-04-11 DIAGNOSIS — F419 Anxiety disorder, unspecified: Secondary | ICD-10-CM

## 2013-04-11 DIAGNOSIS — F329 Major depressive disorder, single episode, unspecified: Secondary | ICD-10-CM

## 2013-04-11 DIAGNOSIS — F32A Depression, unspecified: Secondary | ICD-10-CM

## 2013-04-12 ENCOUNTER — Other Ambulatory Visit (HOSPITAL_COMMUNITY): Payer: BC Managed Care – PPO | Admitting: Psychiatry

## 2013-04-12 DIAGNOSIS — F419 Anxiety disorder, unspecified: Secondary | ICD-10-CM

## 2013-04-12 DIAGNOSIS — F32A Depression, unspecified: Secondary | ICD-10-CM

## 2013-04-12 DIAGNOSIS — F329 Major depressive disorder, single episode, unspecified: Secondary | ICD-10-CM

## 2013-04-12 NOTE — Progress Notes (Signed)
    Daily Group Progress Note  Program: IOP  Group Time: 9:00-11:00 am  Participation Level: Active  Behavioral Response: Appropriate  Type of Therapy:  Individual Therapy  Summary of Progress: Intern Aldona Bar) met with TH for individual session. Counselor and client discussed client's recent challenges related to her short term disability.  Client indicated that she has been experiencing a lot of personal stress due to anxiety.   Client indicated feeling emotionally overloaded and unable to think rationally. Client indicated feeling guilty for expressing anger or strong emotions without thinking. Client stated that she wanted to learn how to think rationally, but was afraid of looking weak. Counselor asked client to scale her feelings of distress. Client indicated that regret was more distressing than feeling weak.   With counselor's help, client indicated being able to choose to stay silent in times of anger. Client doesn't seem conscious of this decision making process.  Making this decision process conscious and reprocessing "weakness" may be helpful for client.  Client indicated that she felt like she was going into the "darkness". Counselor assessed for suicidal ideation. Client indicated that she had thoughts of suicide, but she had not current plan or intent. Client indicated religious and familial barriers to suicide. She stated that she asked her partner to hide the weapons in her home, and monitor her medication.  Client indicated that she had friends to reach out to in times of distress and stated that she would contact the suicide hotline or the ED if she felt she was going to kill herself.        Tresa Res, LCSW

## 2013-04-12 NOTE — Progress Notes (Deleted)
   THERAPIST PROGRESS NOTE  Session Time: ***  Participation Level: {BHH PARTICIPATION LEVEL:22264}  Behavioral Response: {Appearance:22683}{BHH LEVEL OF CONSCIOUSNESS:22305}{BHH MOOD:22306}  Type of Therapy: {CHL AMB BH Type of Therapy:21022741}  Treatment Goals addressed: {CHL AMB BH Treatment Goals Addressed:21022754}  Interventions: {CHL AMB BH Type of Intervention:21022753}  Summary: Wendy Duffy is a 38 y.o. female who presents with ***.   Suicidal/Homicidal: {BHH YES OR NO:22294}{yes/no/with/without intent/plan:22693}  Therapist Response: ***  Plan: Return again in *** weeks.  Diagnosis: Axis I: {psych axis 1:31909}    Axis II: {psych axis 2:31910}    Tresa Res, LCSW 04/12/2013

## 2013-04-12 NOTE — Progress Notes (Signed)
    Daily Group Progress Note  Program: IOP  Group Time: 9:00-10:30 am   Participation Level: Active  Behavioral Response: Appropriate  Type of Therapy:  Process Group  Summary of Progress: pt reports improved mood today and said she is using the tools she is learning with her boyfriend and others. Pt shared her tendency to get angry with others when she feels she needs to defend herself and described how this has been getting in her way of advancing at work. Pt said she still has job stress, but would like to learn how to better handle herself at work in order to get a promotion.      Group Time: 10:30 am - 12:00 pm   Participation Level:  Active  Behavioral Response: Appropriate  Type of Therapy: Psycho-education Group  Summary of Progress: pt learned the skill of assertive communication and compared it to aggressive communication and the benefits of using assertive over aggressive to get needs met.   Tresa Res, LCSW

## 2013-04-13 ENCOUNTER — Encounter (HOSPITAL_COMMUNITY): Payer: Self-pay

## 2013-04-13 ENCOUNTER — Other Ambulatory Visit (HOSPITAL_COMMUNITY): Payer: BC Managed Care – PPO | Admitting: Psychiatry

## 2013-04-13 DIAGNOSIS — F32A Depression, unspecified: Secondary | ICD-10-CM

## 2013-04-13 DIAGNOSIS — F329 Major depressive disorder, single episode, unspecified: Secondary | ICD-10-CM

## 2013-04-13 NOTE — Progress Notes (Signed)
Patient ID: Wendy Duffy, female   DOB: 11-Oct-1975, 38 y.o.   MRN: [id number] A:  Placed call to [facility] [phone]), spoke to Philmore Pali per pt's request.  According to [name] Saxon, they received all medical records from this writer on 04-10-13 and pt's case manager was currently reviewing the information.  Will inform pt.
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
## 2013-04-14 ENCOUNTER — Telehealth: Payer: Self-pay | Admitting: *Deleted

## 2013-04-14 ENCOUNTER — Other Ambulatory Visit (HOSPITAL_COMMUNITY): Payer: BC Managed Care – PPO | Admitting: Psychiatry

## 2013-04-14 DIAGNOSIS — F32A Depression, unspecified: Secondary | ICD-10-CM

## 2013-04-14 DIAGNOSIS — F419 Anxiety disorder, unspecified: Secondary | ICD-10-CM

## 2013-04-14 DIAGNOSIS — F329 Major depressive disorder, single episode, unspecified: Secondary | ICD-10-CM

## 2013-04-14 NOTE — Telephone Encounter (Signed)
Will forward to Dr. Olevia Bowens. Roc Streett,CMA

## 2013-04-14 NOTE — Telephone Encounter (Signed)
Pt dropped off FMLA form.  Will placed in MDs box. Fleeger, Salome Spotted

## 2013-04-14 NOTE — Telephone Encounter (Signed)
Pt states she had the paperwork filled out by Dr Olevia Bowens 2 weeks ago. Pt feels she needs "more time". Paperwork indicated 2 days per week but she sometime needs weeks. Her HR dept suggested the paperwork be revised to indicate "as needed" Please advise

## 2013-04-14 NOTE — Telephone Encounter (Signed)
I have never seen this patient. If I need to complete this paperwork, she should come to clinic for a visit with me. Paperwork returned to blue team.  Thank you, Amber M. Hairford, M.D.

## 2013-04-14 NOTE — Progress Notes (Signed)
Patient ID: Wendy Duffy, female   DOB: 05-Dec-1975, 38 y.o.   MRN: [id number] Patient reviewed and interviewed today, continues to be anxious, depressed having trouble sleeping with poor concentration. Appetite is fair patient forces herself to eat, depression continues and Patient continues to ruminate about her life stressors,  Nihilistic most of the time with hopelessness and helplessness. Patient is tolerating her medications well and her Risperdal will be increased to 1.5 mg at bedtime. Patient stated understanding and will continue on her Wellbutrin XL 300 mg and Vistaril 50 mg every day. Patient denies suicidal and homicidal ideation, has no hallucinations.

## 2013-04-14 NOTE — Progress Notes (Signed)
    Daily Group Progress Note  Program: IOP  Group Time: 9:00-10:30 am   Participation Level: Active  Behavioral Response: Appropriate  Type of Therapy:  Process Group  Summary of Progress: Pt presented with less anxious affect and her speech was not quivering as she spoke as it normally does when she feels anxious. Pt reports a reduction in depression and anxiety symptoms. Pt processed stressful situations at work with co-workers and explored new ways of learning to accept and let go of things that are out of her control and not be so defensive, as is tendency when she feels threatened. Pt was introduced to some new communication tools to help her be more effective with co-workers when she returns to the job.      Group Time: 10:30 am - 12:00 pm   Participation Level:  Active  Behavioral Response: Appropriate  Type of Therapy: Psycho-education Group  Summary of Progress: Pt was educated on the importance of relaxation to manage stress and anxiety symptoms. Pt practiced using Progressive Muscle Relaxation as a relaxation tool and reported a reduction in stress symptoms following.   Tresa Res, LCSW

## 2013-04-14 NOTE — Telephone Encounter (Signed)
Tried to call pt and let her know that she will need to be seen before this paperwork can be filled out.  VM was not set up on number given [phone].  Please let her know this if she calls back.  Jazmin Hartsell,CMA

## 2013-04-17 ENCOUNTER — Encounter (HOSPITAL_COMMUNITY): Payer: Self-pay | Admitting: Psychiatry

## 2013-04-17 ENCOUNTER — Other Ambulatory Visit (HOSPITAL_COMMUNITY): Payer: BC Managed Care – PPO | Attending: Psychiatry | Admitting: Psychiatry

## 2013-04-17 DIAGNOSIS — R45851 Suicidal ideations: Secondary | ICD-10-CM | POA: Insufficient documentation

## 2013-04-17 DIAGNOSIS — G4700 Insomnia, unspecified: Secondary | ICD-10-CM | POA: Insufficient documentation

## 2013-04-17 DIAGNOSIS — I10 Essential (primary) hypertension: Secondary | ICD-10-CM | POA: Insufficient documentation

## 2013-04-17 DIAGNOSIS — F332 Major depressive disorder, recurrent severe without psychotic features: Secondary | ICD-10-CM | POA: Insufficient documentation

## 2013-04-17 DIAGNOSIS — F411 Generalized anxiety disorder: Secondary | ICD-10-CM | POA: Insufficient documentation

## 2013-04-17 DIAGNOSIS — F331 Major depressive disorder, recurrent, moderate: Secondary | ICD-10-CM

## 2013-04-17 NOTE — Progress Notes (Signed)
    Daily Group Progress Note  Program: IOP  Group Time: 9:00-10:30 am   Participation Level: Active   Behavioral Response: Appropriate    Type of Therapy: Process Group   Summary of Progress: Pt reported positive mood, smiled and laughed appropriately. Pt. Attributed positive mood to engagement to her boyfriend of two years over the weekend. Pt. Participated in role plays to practice new communication skills with her employer including listening for the needs of the sender of the message, communicating her strengths and needs in the relationship, and not over-personalizing the sender's statements. Pt. Was receptive to constructive feedback and suggestions from the group.  Group Time: 10:30 am - 12:00 pm   Participation Level: Active   Behavioral Response: Appropriate    Type of Therapy: Psycho-education Group    Summary of Progress: Pt participated in the Grief/Loss group facilitated by Jeanella Craze.   Nancie Neas, COUNS

## 2013-04-18 ENCOUNTER — Other Ambulatory Visit (HOSPITAL_COMMUNITY): Payer: BC Managed Care – PPO | Admitting: Psychiatry

## 2013-04-18 DIAGNOSIS — F32A Depression, unspecified: Secondary | ICD-10-CM

## 2013-04-18 DIAGNOSIS — F329 Major depressive disorder, single episode, unspecified: Secondary | ICD-10-CM

## 2013-04-18 NOTE — Progress Notes (Signed)
Patient ID: Wendy Duffy, female   DOB: Jul 19, 1975, 38 y.o.   MRN: [id number]  Daily Group Progress Note  Program: IOP  Group Time: 9:00-10:30 am   Participation Level: Active   Behavioral Response: Appropriate   Type of Therapy: Process Group   Summary of Progress: Pt continues to report positive mood, smiled and laughed appropriately. Pt. Reported ability to use skills needed to develop healthy relationship in her relationship with significant other including empathy development, providing emotional balance to interactions, and the ability to place significant events into perspective in light of her values. Pt. Provided appropriate positive feedback for fellow group members.  Group Time: 10:30 am - 12:00 pm   Participation Level: Active   Behavioral Response: Appropriate   Type of Therapy: Psycho-education Group  Summary of Progress: Pt participated skills group about developing healthy relationship boundaries.

## 2013-04-19 ENCOUNTER — Encounter (HOSPITAL_COMMUNITY): Payer: Self-pay | Admitting: Psychiatry

## 2013-04-19 ENCOUNTER — Other Ambulatory Visit (HOSPITAL_COMMUNITY): Payer: BC Managed Care – PPO | Admitting: Psychiatry

## 2013-04-19 DIAGNOSIS — F32A Depression, unspecified: Secondary | ICD-10-CM

## 2013-04-19 DIAGNOSIS — F329 Major depressive disorder, single episode, unspecified: Secondary | ICD-10-CM

## 2013-04-19 NOTE — Telephone Encounter (Signed)
FMLA completed. I cannot fill it out for as needed quite like she wants but I tried to make it more liberal.

## 2013-04-19 NOTE — Progress Notes (Signed)
    Daily Group Progress Note  Program: IOP  Group Time: 9:00-10:30  Participation Level: Active  Behavioral Response: Appropriate and Sharing  Type of Therapy:  Group Therapy  Summary of Progress: Pt. Continues to report positive mood, smiles and laughs appropriately. Pt. States that she is encouraged by ability to take perspective of others and not take responses of others too personally.      Group Time: 10:45-12:00  Participation Level:  Active  Behavioral Response: Appropriate and Sharing  Type of Therapy: Psycho-education Group  Summary of Progress: Pt. Participated in part 2 of skills group based on developing healthy relationship boundaries.  Nancie Neas, COUNS

## 2013-04-20 ENCOUNTER — Other Ambulatory Visit (HOSPITAL_COMMUNITY): Payer: BC Managed Care – PPO | Admitting: Psychiatry

## 2013-04-20 DIAGNOSIS — F32A Depression, unspecified: Secondary | ICD-10-CM

## 2013-04-20 DIAGNOSIS — F329 Major depressive disorder, single episode, unspecified: Secondary | ICD-10-CM

## 2013-04-20 NOTE — Progress Notes (Signed)
    Daily Group Progress Note  Program: IOP  Group Time: 9:00-10:30  Participation Level: Minimal  Behavioral Response: Distracted  Type of Therapy:  Group Therapy  Summary of Progress: Pt. Reports euthymic mood. Participated in [employer] . Pt. Indicated that her mood was attributed to wedding planning. Pt. Was focused on writing notes for most of the session.     Group Time: 10:30-12:00  Participation Level:  Active  Behavioral Response: Appropriate and Sharing  Type of Therapy: Psycho-education Group  Summary of Progress: Pt. Participated in discussion about creating healthy boundaries. Pt. Shared needs for space in relationship with her fiance and boundaries that she set to meet her personal needs.   Eloise Levels, Ph.D., [facility]

## 2013-04-21 ENCOUNTER — Other Ambulatory Visit (HOSPITAL_COMMUNITY): Payer: BC Managed Care – PPO

## 2013-04-24 ENCOUNTER — Encounter (HOSPITAL_COMMUNITY): Payer: Self-pay | Admitting: Psychiatry

## 2013-04-24 ENCOUNTER — Other Ambulatory Visit (HOSPITAL_COMMUNITY): Payer: BC Managed Care – PPO | Admitting: Psychiatry

## 2013-04-24 DIAGNOSIS — F329 Major depressive disorder, single episode, unspecified: Secondary | ICD-10-CM

## 2013-04-24 DIAGNOSIS — F32A Depression, unspecified: Secondary | ICD-10-CM

## 2013-04-24 NOTE — Progress Notes (Signed)
    Daily Group Progress Note  Program: IOP  Group Time: 9:00-10:30  Participation Level: Minimal  Behavioral Response: Resistant and Evasive  Type of Therapy:  Group Therapy  Summary of Progress: Pt. Was 15 minutes late for group and left 10 minutes before break. Pt. Overly focused on notewriting during group, disengaged from the group process. Pt. Presented as distracted and evasive when attempts were made to encourage participation in the group process.     Group Time: 10:30-12:00  Participation Level:  Minimal  Behavioral Response: Resistant and Evasive  Type of Therapy: Psycho-education Group  Summary of Progress: Pt. Participated in grief/loss group facilitated by Jeanella Craze.  Nancie Neas, COUNS

## 2013-04-25 ENCOUNTER — Other Ambulatory Visit (HOSPITAL_COMMUNITY): Payer: BC Managed Care – PPO | Admitting: Psychiatry

## 2013-04-25 ENCOUNTER — Encounter (HOSPITAL_COMMUNITY): Payer: Self-pay | Admitting: Psychiatry

## 2013-04-25 DIAGNOSIS — F329 Major depressive disorder, single episode, unspecified: Secondary | ICD-10-CM

## 2013-04-25 DIAGNOSIS — F32A Depression, unspecified: Secondary | ICD-10-CM

## 2013-04-25 NOTE — Progress Notes (Signed)
    Daily Group Progress Note  Program: IOP  Group Time: 9:00-10:30  Participation Level: Active  Behavioral Response: Appropriate and Sharing  Type of Therapy:  Group Therapy  Summary of Progress: Pt. Participated more actively in today's group and was less distracted by her writing. Pt. Shared her process of grieving prior relationship and acceptance that she could not control the behavior of others.     Group Time: 10:30-12:00  Participation Level:  Active  Behavioral Response: Appropriate and Sharing  Type of Therapy: Psycho-education Group  Summary of Progress: Pt. Actively participated in discussion about identifying cognitive distortions, recognized how these distortions manifest in her life, and examples of how to begin countering them.  Nancie Neas, COUNS

## 2013-04-26 ENCOUNTER — Other Ambulatory Visit (HOSPITAL_COMMUNITY): Payer: BC Managed Care – PPO | Admitting: Psychiatry

## 2013-04-26 ENCOUNTER — Encounter (HOSPITAL_COMMUNITY): Payer: Self-pay | Admitting: Psychiatry

## 2013-04-26 DIAGNOSIS — F32A Depression, unspecified: Secondary | ICD-10-CM

## 2013-04-26 DIAGNOSIS — F329 Major depressive disorder, single episode, unspecified: Secondary | ICD-10-CM

## 2013-04-26 NOTE — Progress Notes (Signed)
    Daily Group Progress Note  Program: IOP  Group Time: 9:00-10:30  Participation Level: Active  Behavioral Response: Resistant  Type of Therapy:  Group Therapy  Summary of Progress: Pt. Reports that she is feeling "good", sleeping well. Pt. Has demonstrated poor motivation for group participation since her marriage engagement last week. Pt. Complains that she is in a different place than other group members who are more depressed than she. Pt. Had to be redirected from engaging in behavior (i.e., fishing around in purse) that was disruptive to group process.     Group Time:  10:30-12:00  Participation Level:  Active  Behavioral Response: Appropriate  Type of Therapy: Psycho-education Group  Summary of Progress: Pt. Participated in group focused on identifying and countering cognitive distortions.  [facility]

## 2013-04-27 ENCOUNTER — Other Ambulatory Visit (HOSPITAL_COMMUNITY): Payer: BC Managed Care – PPO | Admitting: Psychiatry

## 2013-04-27 ENCOUNTER — Encounter (HOSPITAL_COMMUNITY): Payer: Self-pay | Admitting: Psychiatry

## 2013-04-27 DIAGNOSIS — F32A Depression, unspecified: Secondary | ICD-10-CM

## 2013-04-27 DIAGNOSIS — F329 Major depressive disorder, single episode, unspecified: Secondary | ICD-10-CM

## 2013-04-27 NOTE — Progress Notes (Signed)
Patient ID: Wendy Duffy, female   DOB: 11-03-1975, 38 y.o.   MRN: [id number]  Program: IOP  Group Time: 9:00-10:30   Participation Level: Active   Behavioral Response: Appropriate  Type of Therapy: Group Therapy   Summary of Progress: Pt. Continues to report that she is feeling good, looking forward to discharge tomorrow. Pt. Participated in process of theme related to meeting unattainable expectations, lack of control over outcomes, feeling undervalued at work.  Group Time: 10:30-12:00   Participation Level: Active   Behavioral Response: Appropriate   Type of Therapy: Psycho-education Group   Summary of Progress: Pt. Participated in group facilitated by the mental health association.Eloise Levels, Ph.D., [facility], [place]

## 2013-04-28 ENCOUNTER — Other Ambulatory Visit (HOSPITAL_COMMUNITY): Payer: BC Managed Care – PPO | Admitting: Psychiatry

## 2013-04-28 DIAGNOSIS — F331 Major depressive disorder, recurrent, moderate: Secondary | ICD-10-CM

## 2013-04-28 NOTE — Patient Instructions (Signed)
Pt completed MH-IOP today.  Follow up with Delrae Alfred, NP on 05-02-13 @ 9 a.m and Jan Fireman, LPC on 05-09-13 @ 9 a.m.  Encouraged support groups.  Return to work on 05-15-13 without any restrictions.

## 2013-04-28 NOTE — Progress Notes (Signed)
Patient ID: Wendy Duffy, female   DOB: 1975-04-16, 38 y.o.   MRN: [id number] D: This is a 38 single, African American female, who was referred per [facility], treatment for worsening depressive and anxiety symptoms. Pt was in the ED for 24 hrs. Sx's included: Increased irritability (verbally lashes out), racing thoughts, decreased concentration, poor energy and motivation, anhedonia, crying spells, insomnia (2-3 hrs of sleep) and passive SI. Denied a plan or intent. Pt was able to contract for safety. Previous attempt (OD) when she was 38 yr old. Denied any self mutilating behaviors. No previous hospitalizations for mental health. Saw a therapist in January 2014 d/t depression. Denies HI or A/V hallucinations. Currently denies any SI.  Family Hx: Mother (ETOH), Maternal GM (ETOH and drugs), and Maternal Uncle (drugs).  According to pt, her symptoms worsened after her [birth year] on [birth year]. 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 According to pt, the job is very demanding. "I have to meet quotas, on my feet all day, and I have to work with the public. I don't feel appreciated." Pt states she had applied for a promotion last summer, but was told she didn't get the job because it was more stressful. 2) Relationship with boyfriend of 1 1/2 years. States he is [place]. Met his family for the first time last year. Reports he is a good man and provider, but she isn't use to someone being good to her. "He is a very touchy, intimate person, and since my depression I've been pushing him away." States she's afraid he may decide to leave her. 3) Empty Nest: Two oldest kids (23 yo daughter and 35 yo son) moved in with their father in June 2013 in order to attend a certain school, which was in his district. Apparently, due to the decision both parents made, pt is getting a lot of negative feedback from her family. "They are telling me that I am a bad parent  for letting my kids leave." Pt states she and the kid's father are co-parenting very well. Pt states she feels better.  Continues to struggle with sleep.  C/O having vivid dreams.  "I have learned a lot of coping skills."  Reports being fearful that she'll forget the skills whenever she returns to work.  States the Risperdal has helped a lot with her symptoms.  Pt also is c/o her breast feeling tender and are fuller.  A:  D/C today.  Encouraged support groups.  RTW on 05-15-13, without any restrictions.  F/U with Delrae Alfred, NP on 05-02-13 @ 9 am and Jan Fireman, LPC on 05-09-13 @ 9 a.m..  R:  Pt receptive.

## 2013-05-01 ENCOUNTER — Other Ambulatory Visit (HOSPITAL_COMMUNITY): Payer: BC Managed Care – PPO

## 2013-05-02 ENCOUNTER — Other Ambulatory Visit (HOSPITAL_COMMUNITY): Payer: BC Managed Care – PPO

## 2013-05-02 ENCOUNTER — Ambulatory Visit (HOSPITAL_COMMUNITY): Payer: Self-pay | Admitting: Psychiatry

## 2013-05-03 ENCOUNTER — Other Ambulatory Visit (HOSPITAL_COMMUNITY): Payer: BC Managed Care – PPO

## 2013-05-04 ENCOUNTER — Other Ambulatory Visit (HOSPITAL_COMMUNITY): Payer: BC Managed Care – PPO

## 2013-05-04 NOTE — Progress Notes (Signed)
Discharge Note  Patient:  Wendy Duffy is an 38 y.o., female DOB:  July 12, 1975  Date of Admission:  03/30/13  Date of Discharge:  04/28/13   Reason for Admission: Depression and anxiety  Hospital Course: Patient started IOP and her Wellbutrin was switched to Wellbutrin XL 300 mg every day and her BuSpar was discontinued. Patient began attending groups and initially had difficulty participating as she would become very tearful and cried copiously. Patient would also ruminates constantly and was very anxious. Because of this constant rumination and anxiety she was started on Risperdal 1 mg twice a day which she tolerated well and gradually began to stabilize. Her sleep and appetite improved her mood stabilized she complained of mild breast tenderness but no galactorhea. Her mood was stable she had no crying spells she denied suicidal or homicidal ideation and had no hallucinations or delusions.  Mental Status at Discharge: Patient was alert, oriented x3, affect was full mood was euthymic speech was normal language was normal. Musculoskeletal was normal. She had no suicidal or homicidal ideation no hallucinations or delusions. Recent and remote memory was good, judgment and insight were good, concentration and recall were good.  Lab Results: No results found for this or any previous visit (from the past 48 hour(s)).  Current outpatient prescriptions:amLODipine (NORVASC) 10 MG tablet, take 1 tablet by mouth once daily, Disp: 90 tablet, Rfl: 1;  buPROPion (WELLBUTRIN XL) 300 MG 24 hr tablet, Take 1 tablet (300 mg total) by mouth every morning., Disp: 30 tablet, Rfl: 2;  camphor-menthol (SARNA) lotion, Apply topically as needed for itching., Disp: 222 mL, Rfl: 0 hydrochlorothiazide (HYDRODIURIL) 25 MG tablet, take 1 tablet by mouth once daily, Disp: 90 tablet, Rfl: 0;  hydrOXYzine (ATARAX/VISTARIL) 50 MG tablet, Take 25 mg by mouth at bedtime., Disp: , Rfl: ;  ibuprofen (ADVIL,MOTRIN) 200 MG tablet, Take 600  mg by mouth every 6 (six) hours as needed for moderate pain., Disp: , Rfl: ;  losartan (COZAAR) 25 MG tablet, take 1 tablet by mouth once daily, Disp: 90 tablet, Rfl: 1 omeprazole (PRILOSEC) 40 MG capsule, take 1 capsule by mouth once daily, Disp: 30 capsule, Rfl: 5;  risperiDONE (RISPERDAL) 1 MG tablet, Take 1 tablet (1 mg total) by mouth 2 (two) times daily., Disp: 60 tablet, Rfl: 0  Axis Diagnosis:   Axis I: Anxiety Disorder NOS and Major Depression, Recurrent severe Axis II: Deferred Axis III:  Past Medical History  Diagnosis Date  . Hypertension   . Hemorrhoids   . Anxiety   . Depression    Axis IV: economic problems, occupational problems, other psychosocial or environmental problems, problems related to social environment and problems with primary support group Axis V: 61-70 mild symptoms   Level of Care:  OP  Discharge destination:  Home  Is patient on multiple antipsychotic therapies at discharge:  No    Has Patient had three or more failed trials of antipsychotic monotherapy by history:  No  Patient phone:  [phone] (home)  Patient address:   [place] [address],   Follow-up recommendations:  Activity:  As tolerated Diet:  Regular Other:  Followup for medications and therapy as scheduled,[facility] for meds and [facility] [place] for therapy.  Comments:  None  The patient received suicide prevention pamphlet:  Yes  Erin Sons 05/04/2013, 3:21 PM

## 2013-05-05 ENCOUNTER — Other Ambulatory Visit (HOSPITAL_COMMUNITY): Payer: BC Managed Care – PPO

## 2013-05-08 ENCOUNTER — Other Ambulatory Visit (HOSPITAL_COMMUNITY): Payer: BC Managed Care – PPO

## 2013-05-09 ENCOUNTER — Ambulatory Visit (HOSPITAL_COMMUNITY): Payer: Self-pay | Admitting: Psychology

## 2013-05-09 ENCOUNTER — Other Ambulatory Visit (HOSPITAL_COMMUNITY): Payer: BC Managed Care – PPO

## 2013-05-10 ENCOUNTER — Other Ambulatory Visit (HOSPITAL_COMMUNITY): Payer: BC Managed Care – PPO

## 2013-05-11 ENCOUNTER — Other Ambulatory Visit (HOSPITAL_COMMUNITY): Payer: BC Managed Care – PPO

## 2013-05-12 ENCOUNTER — Other Ambulatory Visit (HOSPITAL_COMMUNITY): Payer: BC Managed Care – PPO

## 2013-05-15 ENCOUNTER — Ambulatory Visit (HOSPITAL_COMMUNITY): Payer: Self-pay | Admitting: Psychiatry

## 2013-05-15 ENCOUNTER — Other Ambulatory Visit (HOSPITAL_COMMUNITY): Payer: BC Managed Care – PPO

## 2013-05-15 ENCOUNTER — Other Ambulatory Visit: Payer: Self-pay | Admitting: *Deleted

## 2013-05-16 ENCOUNTER — Other Ambulatory Visit (HOSPITAL_COMMUNITY): Payer: BC Managed Care – PPO

## 2013-05-16 MED ORDER — HYDROCHLOROTHIAZIDE 25 MG PO TABS
25.0000 mg | ORAL_TABLET | Freq: Every day | ORAL | Status: DC
Start: ? — End: 2013-07-28

## 2013-05-24 ENCOUNTER — Ambulatory Visit (INDEPENDENT_AMBULATORY_CARE_PROVIDER_SITE_OTHER): Payer: BC Managed Care – PPO | Admitting: Family Medicine

## 2013-05-24 ENCOUNTER — Encounter: Payer: Self-pay | Admitting: Family Medicine

## 2013-05-24 VITALS — BP 125/85 | HR 82 | Temp 98.4°F | Ht 61.0 in | Wt 201.3 lb

## 2013-05-24 DIAGNOSIS — S86912A Strain of unspecified muscle(s) and tendon(s) at lower leg level, left leg, initial encounter: Secondary | ICD-10-CM

## 2013-05-24 DIAGNOSIS — IMO0002 Reserved for concepts with insufficient information to code with codable children: Secondary | ICD-10-CM

## 2013-05-24 MED ORDER — IBUPROFEN 200 MG PO TABS: 800.0000 mg | ORAL_TABLET | Freq: Three times a day (TID) | ORAL | Status: DC | PRN

## 2013-05-24 NOTE — Progress Notes (Signed)
Subjective:     Patient ID: Wendy Duffy, female   DOB: 1975/12/15, 38 y.o.   MRN: [id number]  HPI 38 y.o. F presents with 1 week of knee pain that started without trigger. On medial side of knee. Pt has been having some popping but otherwise stable. Does not feel like it is giving out and has not locked up. Reports 4/10 sharp pain.  Review of Systems No f/c    Objective:   Physical Exam Range of Motion:   0 to 135  Bilaterally  Deg; minimal pain at extreme on left leg.   Strength Testing:  Globally  5/5.  Flexion   5/5.  Extension  5/5.  Neurovascular:  2+ pulses AT (foot), <2 sec capillary refill; 2+ DTRs (patella)Distal neurovascular intact .    Special Tests Patella/Patellofemoral joint:  Normal.  Grind: Negative.  Patellar Glide: normal.  Plica:  None.   Meniscal Exams:  McMurray: negative.   .       Ligamentous Stability:   ACL:    Anterior Drawer:  normal   ;  End point:  firm.  Pivot Shift:  negative.   PCL:  Posterior Drawer:  normal ;  End point:  firm.  Sag sign:  negative.  MCL:  Valgus Stress for MCL: normal ;  End point:  firm.   LCL:  Varus Stress for LCL:  normal ;  End point:  Firm.   KNEE PAIN ASSESSEMENT AND PLAN: Knee pain - Acute but persistent, no worrisome symptoms for instability, infection, or meniscal pathology, or systemic joint process. Likely mild tendonopathy vs ligamentous injury vs. patellofemoral syndrome. OA may be contributing to symptoms.  [X]  Motrin PRN for pain [X]  Discussed rest, ice, elevation, brace PRN [X]  Provided exercises for patient to do at home [X]  Recommended avoiding positions that causes pain [X]  Recommended against exercising through pain, but may exercise otherwise [X]  Follow up in one month or earlier if symptoms worsen [X]  Handout provided with stretching and strengthening techniques  Fredrik Rigger, MD OB Fellow

## 2013-05-24 NOTE — Patient Instructions (Signed)
Knee Exercises EXERCISES RANGE OF MOTION(ROM) AND STRETCHING EXERCISES These exercises may help you when beginning to rehabilitate your injury. Your symptoms may resolve with or without further involvement from your physician, physical therapist or athletic trainer. While completing these exercises, remember:   Restoring tissue flexibility helps normal motion to return to the joints. This allows healthier, less painful movement and activity.  An effective stretch should be held for at least 30 seconds.  A stretch should never be painful. You should only feel a gentle lengthening or release in the stretched tissue. STRETCH - Knee Extension, Prone  Lie on your stomach on a firm surface, such as a bed or countertop. Place your right / left knee and leg just beyond the edge of the surface. You may wish to place a towel under the far end of your right / left thigh for comfort.  Relax your leg muscles and allow gravity to straighten your knee. Your clinician may advise you to add an ankle weight if more resistance is helpful for you.  You should feel a stretch in the back of your right / left knee. Hold this position for __________ seconds. Repeat _____10_____ times. Complete this stretch __3________ times per day. * Your physician, physical therapist or athletic trainer may ask you to add ankle weight to enhance your stretch.  RANGE OF MOTION - Knee Flexion, Active  Lie on your back with both knees straight. (If this causes back discomfort, bend your opposite knee, placing your foot flat on the floor.)  Slowly slide your heel back toward your buttocks until you feel a gentle stretch in the front of your knee or thigh.  Hold for __________ seconds. Slowly slide your heel back to the starting position. Repeat __________ times. Complete this exercise __________ times per day.  STRETCH - Quadriceps, Prone   Lie on your stomach on a firm surface, such as a bed or padded floor.  Bend your right /  left knee and grasp your ankle. If you are unable to reach, your ankle or pant leg, use a belt around your foot to lengthen your reach.  Gently pull your heel toward your buttocks. Your knee should not slide out to the side. You should feel a stretch in the front of your thigh and/or knee.  Hold this position for __________ seconds. Repeat __________ times. Complete this stretch __________ times per day.  STRETCH  Hamstrings, Supine   Lie on your back. Loop a belt or towel over the ball of your right / left foot.  Straighten your right / left knee and slowly pull on the belt to raise your leg. Do not allow the right / left knee to bend. Keep your opposite leg flat on the floor.  Raise the leg until you feel a gentle stretch behind your right / left knee or thigh. Hold this position for __________ seconds. Repeat __________ times. Complete this stretch __________ times per day.  STRENGTHENING EXERCISES These exercises may help you when beginning to rehabilitate your injury. They may resolve your symptoms with or without further involvement from your physician, physical therapist or athletic trainer. While completing these exercises, remember:   Muscles can gain both the endurance and the strength needed for everyday activities through controlled exercises.  Complete these exercises as instructed by your physician, physical therapist or athletic trainer. Progress the resistance and repetitions only as guided.  You may experience muscle soreness or fatigue, but the pain or discomfort you are trying to eliminate should  never worsen during these exercises. If this pain does worsen, stop and make certain you are following the directions exactly. If the pain is still present after adjustments, discontinue the exercise until you can discuss the trouble with your clinician. STRENGTH - Quadriceps, Isometrics  Lie on your back with your right / left leg extended and your opposite knee bent.  Gradually  tense the muscles in the front of your right / left thigh. You should see either your knee cap slide up toward your hip or increased dimpling just above the knee. This motion will push the back of the knee down toward the floor/mat/bed on which you are lying.  Hold the muscle as tight as you can without increasing your pain for __________ seconds.  Relax the muscles slowly and completely in between each repetition. Repeat __________ times. Complete this exercise __________ times per day.  STRENGTH - Quadriceps, Short Arcs   Lie on your back. Place a __________ inch towel roll under your knee so that the knee slightly bends.  Raise only your lower leg by tightening the muscles in the front of your thigh. Do not allow your thigh to rise.  Hold this position for __________ seconds. Repeat __________ times. Complete this exercise __________ times per day.    STRENGTH - Quadriceps, Straight Leg Raises  Quality counts! Watch for signs that the quadriceps muscle is working to insure you are strengthening the correct muscles and not "cheating" by substituting with healthier muscles.  Lay on your back with your right / left leg extended and your opposite knee bent.  Tense the muscles in the front of your right / left thigh. You should see either your knee cap slide up or increased dimpling just above the knee. Your thigh may even quiver.  Tighten these muscles even more and raise your leg 4 to 6 inches off the floor. Hold for __________ seconds.  Keeping these muscles tense, lower your leg.  Relax the muscles slowly and completely in between each repetition. Repeat __________ times. Complete this exercise __________ times per day.  STRENGTH - Hamstring, Curls  Lay on your stomach with your legs extended. (If you lay on a bed, your feet may hang over the edge.)  Tighten the muscles in the back of your thigh to bend your right / left knee up to 90 degrees. Keep your hips flat on the  bed/floor.  Hold this position for __________ seconds.  Slowly lower your leg back to the starting position. Repeat __________ times. Complete this exercise __________ times per day.  OPTIONAL ANKLE WEIGHTS: Begin with ____________________, but DO NOT exceed ____________________. Increase in 1 pound/0.5 kilogram increments.  STRENGTH  Quadriceps, Squats  Stand in a door frame so that your feet and knees are in line with the frame.  Use your hands for balance, not support, on the frame.  Slowly lower your weight, bending at the hips and knees. Keep your lower legs upright so that they are parallel with the door frame. Squat only within the range that does not increase your knee pain. Never let your hips drop below your knees.  Slowly return upright, pushing with your legs, not pulling with your hands. Repeat __________ times. Complete this exercise __________ times per day.  STRENGTH - Quadriceps, Wall Slides  Follow guidelines for form closely. Increased knee pain often results from poorly placed feet or knees.  Lean against a smooth wall or door and walk your feet out 18-24 inches. Place your feet hip-width  apart.  Slowly slide down the wall or door until your knees bend __________ degrees.* Keep your knees over your heels, not your toes, and in line with your hips, not falling to either side.  Hold for __________ seconds. Stand up to rest for __________ seconds in between each repetition. Repeat __________ times. Complete this exercise __________ times per day. * Your physician, physical therapist or athletic trainer will alter this angle based on your symptoms and progress. Document Released: 01/14/2005 Document Revised: 05/25/2011 Document Reviewed: 06/14/2008 [facility] Patient Information 2014 [place], [place].

## 2013-06-01 ENCOUNTER — Encounter (HOSPITAL_COMMUNITY): Payer: Self-pay | Admitting: Psychiatry

## 2013-06-06 ENCOUNTER — Ambulatory Visit (INDEPENDENT_AMBULATORY_CARE_PROVIDER_SITE_OTHER): Payer: BC Managed Care – PPO | Admitting: Psychology

## 2013-06-06 ENCOUNTER — Encounter (HOSPITAL_COMMUNITY): Payer: Self-pay | Admitting: Psychology

## 2013-06-06 DIAGNOSIS — F419 Anxiety disorder, unspecified: Secondary | ICD-10-CM

## 2013-06-06 DIAGNOSIS — F331 Major depressive disorder, recurrent, moderate: Secondary | ICD-10-CM

## 2013-06-06 DIAGNOSIS — F411 Generalized anxiety disorder: Secondary | ICD-10-CM
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
## 2013-06-14 ENCOUNTER — Ambulatory Visit (INDEPENDENT_AMBULATORY_CARE_PROVIDER_SITE_OTHER): Payer: BC Managed Care – PPO | Admitting: Psychiatry

## 2013-06-14 ENCOUNTER — Encounter (HOSPITAL_COMMUNITY): Payer: Self-pay | Admitting: Psychiatry

## 2013-06-14 VITALS — BP 123/84 | HR 107 | Ht 61.0 in | Wt 203.6 lb

## 2013-06-14 DIAGNOSIS — F3162 Bipolar disorder, current episode mixed, moderate: Secondary | ICD-10-CM

## 2013-06-14 DIAGNOSIS — F411 Generalized anxiety disorder: Secondary | ICD-10-CM

## 2013-06-14 MED ORDER — BUPROPION HCL ER (XL) 300 MG PO TB24: 300.0000 mg | ORAL_TABLET | ORAL | Status: DC

## 2013-06-14 MED ORDER — HYDROXYZINE HCL 10 MG PO TABS: 10.0000 mg | ORAL_TABLET | Freq: Three times a day (TID) | ORAL | Status: DC | PRN

## 2013-06-14 MED ORDER — ARIPIPRAZOLE 5 MG PO TABS: 5.0000 mg | ORAL_TABLET | Freq: Every day | ORAL | Status: DC

## 2013-06-14 NOTE — Progress Notes (Signed)
   [place] Follow-up Outpatient Visit  Wendy Duffy December 28, 1975  Date:  06/14/13  Subjective: Patient is here for a follow up Sleeping is 6-8 hours, appetite is increase. Concerned with 15-20 weight gain, and having breast enlargement, and small discharge. She also has irregular menses from increase dose of Risperidone. Patient doesn't like the side effects of Risperidone, and wants to come off medications, instead of reducing med. Other meds are: Risperidone 1.5 mg HS, Hydroxyzine 25 mg HS for insomia, Wellbutrin XL 300 mg po QD. Will dc risperidone, and replace with abilify 2 mg HS for racing thoughts, and monitor of tolerability. She denies SI/HI/AVH. Depression 4/10, Anxiety 5/10. Patient has blunt affect, and dysphoric, and anxious. She has c/o low sex drive, refer to pcp to rule out medical issues, probably due to depression. Will check Risperidone level, if the discharge, and breast enlargement don't subside. Rtc in 4 weeks.    There were no vitals filed for this visit.  Mental Status Examination  Appearance: casual  Alert: Yes Attention: fair  Cooperative: Yes Eye Contact: Fair Speech: slow  Psychomotor Activity: Psychomotor Retardation Memory/Concentration: fair  Oriented: time/date and day of week Mood: Anxious and Dysphoric Affect: Constricted Thought Processes and Associations: Goal Directed Fund of Knowledge: Fair Thought Content: preoccupations Insight: Fair Judgement: Fair  Diagnosis:  Bipolar, mix type, moderate  Unspecified anxiety  Treatment Plan:  Rtc in 4 weeks Wellbutrin XL 300 mg po Abilify 2 mg po QD Check Prolactin Level, if symptoms don't subside.  DC Risperidone    Madison Hickman, NP

## 2013-06-29 ENCOUNTER — Ambulatory Visit (INDEPENDENT_AMBULATORY_CARE_PROVIDER_SITE_OTHER): Payer: BC Managed Care – PPO | Admitting: Psychology

## 2013-06-29 DIAGNOSIS — F419 Anxiety disorder, unspecified: Secondary | ICD-10-CM

## 2013-06-29 DIAGNOSIS — F411 Generalized anxiety disorder: Secondary | ICD-10-CM

## 2013-06-29 NOTE — Progress Notes (Signed)
   THERAPIST PROGRESS NOTE  Session Time: 1.35pm-2.30pm  Participation Level: Active  Behavioral Response: Well GroomedAlertAnxious  Type of Therapy: Individual Therapy  Treatment Goals addressed: Diagnosis: MDD, Anxiety D/O NOS and goal 1.  Interventions: CBT and Supportive  Summary: Wendy Duffy is a 38 y.o. female who presents with report of feeling increased anxious and increased irritability.  Pt feels may be related to change in medication.  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  Pt states on 06/24/13 found home to purchase and offer has been accepted, they are pre approved and working w/ realtor for closing date on mid may 2015.  Pt reports she is struggling to feel calm and be able to enjoy present- pt reports focus on planning, getting everything done and wanting to have done now and can't.  Pt was able to acknowledge how she has coped through similar in the past w/ acknowledging that has time to accomplish and focus on what can do today is helpful.  Pt receptive to use of mindfulness skills to assist in remaining present focused and redirecting worries.  Also explored how to more effectively plan and prioritize.     Suicidal/Homicidal: Nowithout intent/plan  Therapist Response: Assessed pt current functioning per pt report.  Explored w/pt recent stressors and assisted in increasing awareness of this as trigger w/ mood.  Validated and normalized as stressful process and discussed w/pt beneficial ways of coping in past w/ major life changes.  Introduced pt to skill of mindfulness w/ uses of 5 senses and discussed how to incorporate for coping w/ anxious/worry focused.  Discussed goals and developed tx plan.   Plan: Return again in 3-4 weeks.  Diagnosis: Axis I: Anxiety Disorder NOS and MDd    Axis II: No diagnosis    Sabir Charters, LPC 06/29/2013

## 2013-07-06 ENCOUNTER — Telehealth (HOSPITAL_COMMUNITY): Payer: Self-pay | Admitting: *Deleted

## 2013-07-06 MED ORDER — RISPERIDONE 1 MG PO TABS: 1.5000 mg | ORAL_TABLET | Freq: Every day | ORAL | Status: DC

## 2013-07-06 NOTE — Telephone Encounter (Signed)
Patient left VM:At work from 12-8.Ok to leave msg.Abilify did not work for her,mad her more anxious.She stopped it and restarted Risperdone.Now out of Risperdone.needs prescription for Risperdone.

## 2013-07-06 NOTE — Telephone Encounter (Signed)
DC abilify, ordered risperidone 1.5 mg HS/Javionna Leder NP

## 2013-07-06 NOTE — Addendum Note (Signed)
Addended by: Madison Hickman on: 07/06/2013 03:35 PM   Modules accepted: Orders, Medications

## 2013-07-28 ENCOUNTER — Ambulatory Visit (INDEPENDENT_AMBULATORY_CARE_PROVIDER_SITE_OTHER): Payer: BC Managed Care – PPO | Admitting: Family Medicine

## 2013-07-28 ENCOUNTER — Encounter: Payer: Self-pay | Admitting: Family Medicine

## 2013-07-28 VITALS — BP 128/92 | HR 112 | Temp 98.7°F | Wt 202.0 lb

## 2013-07-28 DIAGNOSIS — F329 Major depressive disorder, single episode, unspecified: Secondary | ICD-10-CM

## 2013-07-28 DIAGNOSIS — F3289 Other specified depressive episodes: Secondary | ICD-10-CM

## 2013-07-28 DIAGNOSIS — F32A Depression, unspecified: Secondary | ICD-10-CM

## 2013-07-28 DIAGNOSIS — I10 Essential (primary) hypertension: Secondary | ICD-10-CM

## 2013-07-28 DIAGNOSIS — Z111 Encounter for screening for respiratory tuberculosis: Secondary | ICD-10-CM

## 2013-07-28 DIAGNOSIS — Z299 Encounter for prophylactic measures, unspecified: Secondary | ICD-10-CM

## 2013-07-28 MED ORDER — HYDROCHLOROTHIAZIDE 25 MG PO TABS: 25.0000 mg | ORAL_TABLET | Freq: Every day | ORAL | Status: DC

## 2013-07-28 MED ORDER — LOSARTAN POTASSIUM 25 MG PO TABS: ORAL_TABLET | ORAL | Status: DC

## 2013-07-28 MED ORDER — RISPERIDONE 1 MG PO TABS: 1.5000 mg | ORAL_TABLET | Freq: Every day | ORAL | Status: DC

## 2013-07-28 MED ORDER — AMLODIPINE BESYLATE 10 MG PO TABS: ORAL_TABLET | ORAL | Status: DC

## 2013-07-28 MED ORDER — OMEPRAZOLE 40 MG PO CPDR: DELAYED_RELEASE_CAPSULE | ORAL | Status: DC

## 2013-07-28 NOTE — Progress Notes (Signed)
Patient ID: Wendy Duffy, female   DOB: 08-Apr-1975, 38 y.o.   MRN: [id number]     Subjective: HPI: Patient is a 38 y.o. female presenting to clinic today for follow up. Concerns today include depression  1. Depression - Still struggling with depression. She is followed by behavioral health. Was on Risperdal, but switched to Abilify. States Abilify makes her more on edge and aggressive. She called behavioral health and asked to be switched back. (Per phone notes, this was done and she was not aware.) Her PHQ-9 score is 26. Denies SI/HI although marked on her sheet.  2. Health maintenance- Needs PPD today for employment. 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 Feels this will be less stressful and help with her depression. UTD on labs, needs pap and lipid  In the fall.  3. Hypertension Blood pressure at home: Does not check Blood pressure today:  128/92 Taking Meds: Yes, HCTZ and Cozaar Side effects: None ROS: Denies headache, visual changes, nausea, vomiting, chest pain, abdominal pain or shortness of breath.    History Reviewed: Non smoker.  ROS: Please see HPI above.  Objective: Office vital signs reviewed. Filed Vitals:   07/28/13 1411  BP: 128/92  Pulse: 112  Temp: 98.7 F (37.1 C)   Physical Examination:  General: Awake, alert. NAD. Smiling and interactive HEENT: Atraumatic, normocephalic. MMM Neck: No masses palpated. No LAD Pulm: CTAB, no wheezes Cardio: RRR, no murmurs appreciated Abdomen:+BS, soft, nontender, nondistended Extremities: No edema Neuro: Grossly intact Psych: Good eye contact, smiles. Answers question appropriately.  Assessment: 38 y.o. female follow up  Plan: See Problem List and After Visit Summary

## 2013-07-28 NOTE — Assessment & Plan Note (Signed)
A: BP at goal. Labs UTD  P: - Cont Norvasc, cozaar and HCTZ - Lipid panel in the fall - F/u in 6 months or sooner prn

## 2013-07-28 NOTE — Patient Instructions (Signed)
Please come back Monday for your TB test.  Follow up in 6 months for your pap and labs.  Geniece Akers M. Suezette Lafave, M.D.

## 2013-07-28 NOTE — Assessment & Plan Note (Signed)
PPD placed today for future employment.

## 2013-07-28 NOTE — Addendum Note (Signed)
Addended by: Valerie Roys on: 07/28/2013 03:44 PM   Modules accepted: Orders

## 2013-07-28 NOTE — Assessment & Plan Note (Signed)
A: Followed at [facility]. PHQ-9 score of 26, although happy and interactive today.  P: - Refilled respiral since patient was not aware that [facility] had sent this in - Encouraged to follow up with [facility] soon. - Surveyor, mining discussed

## 2013-07-31 ENCOUNTER — Ambulatory Visit (INDEPENDENT_AMBULATORY_CARE_PROVIDER_SITE_OTHER): Payer: BC Managed Care – PPO | Admitting: *Deleted

## 2013-07-31 DIAGNOSIS — Z111 Encounter for screening for respiratory tuberculosis: Secondary | ICD-10-CM

## 2013-07-31 LAB — TB SKIN TEST
Induration: 0 mm
TB SKIN TEST: NEGATIVE

## 2013-08-03 ENCOUNTER — Other Ambulatory Visit: Payer: Self-pay

## 2013-08-03 ENCOUNTER — Emergency Department (HOSPITAL_COMMUNITY): Payer: BC Managed Care – PPO

## 2013-08-03 ENCOUNTER — Encounter (HOSPITAL_COMMUNITY): Payer: Self-pay | Admitting: Emergency Medicine

## 2013-08-03 DIAGNOSIS — R079 Chest pain, unspecified: Secondary | ICD-10-CM | POA: Insufficient documentation

## 2013-08-03 DIAGNOSIS — F411 Generalized anxiety disorder: Secondary | ICD-10-CM | POA: Insufficient documentation

## 2013-08-03 DIAGNOSIS — R142 Eructation: Secondary | ICD-10-CM

## 2013-08-03 DIAGNOSIS — Z79899 Other long term (current) drug therapy: Secondary | ICD-10-CM | POA: Insufficient documentation

## 2013-08-03 DIAGNOSIS — R141 Gas pain: Secondary | ICD-10-CM | POA: Insufficient documentation

## 2013-08-03 DIAGNOSIS — F329 Major depressive disorder, single episode, unspecified: Secondary | ICD-10-CM | POA: Insufficient documentation

## 2013-08-03 DIAGNOSIS — E876 Hypokalemia: Secondary | ICD-10-CM | POA: Insufficient documentation

## 2013-08-03 DIAGNOSIS — R143 Flatulence: Secondary | ICD-10-CM

## 2013-08-03 DIAGNOSIS — R51 Headache: Secondary | ICD-10-CM | POA: Insufficient documentation

## 2013-08-03 DIAGNOSIS — I10 Essential (primary) hypertension: Secondary | ICD-10-CM | POA: Insufficient documentation

## 2013-08-03 DIAGNOSIS — F3289 Other specified depressive episodes: Secondary | ICD-10-CM | POA: Insufficient documentation

## 2013-08-03 LAB — COMPREHENSIVE METABOLIC PANEL
ALK PHOS: 57 U/L (ref 39–117)
ALT: 12 U/L (ref 0–35)
AST: 14 U/L (ref 0–37)
Albumin: 3.5 g/dL (ref 3.5–5.2)
BUN: 18 mg/dL (ref 6–23)
CALCIUM: 8.9 mg/dL (ref 8.4–10.5)
CO2: 22 meq/L (ref 19–32)
Chloride: 98 mEq/L (ref 96–112)
Creatinine, Ser: 0.92 mg/dL (ref 0.50–1.10)
GFR, EST NON AFRICAN AMERICAN: 79 mL/min — AB (ref >90)
Glucose, Bld: 76 mg/dL (ref 70–99)
Potassium: 3.2 mEq/L — ABNORMAL LOW (ref 3.7–5.3)
SODIUM: 134 meq/L — AB (ref 137–147)
Total Bilirubin: 0.3 mg/dL (ref 0.3–1.2)
Total Protein: 6.5 g/dL (ref 6.0–8.3)

## 2013-08-03 LAB — CBC WITH DIFFERENTIAL/PLATELET
Basophils Absolute: 0 10*3/uL (ref 0.0–0.1)
Basophils Relative: 0 % (ref 0–1)
EOS PCT: 9 % — AB (ref 0–5)
Eosinophils Absolute: 0.7 10*3/uL (ref 0.0–0.7)
HEMATOCRIT: 35.3 % — AB (ref 36.0–46.0)
Hemoglobin: 11.8 g/dL — ABNORMAL LOW (ref 12.0–15.0)
Lymphocytes Relative: 31 % (ref 12–46)
Lymphs Abs: 2.5 10*3/uL (ref 0.7–4.0)
MCH: 26.5 pg (ref 26.0–34.0)
MCHC: 33.4 g/dL (ref 30.0–36.0)
MCV: 79.3 fL (ref 78.0–100.0)
Monocytes Absolute: 0.9 10*3/uL (ref 0.1–1.0)
Monocytes Relative: 12 % (ref 3–12)
Neutro Abs: 3.8 10*3/uL (ref 1.7–7.7)
Neutrophils Relative %: 48 % (ref 43–77)
PLATELETS: 332 10*3/uL (ref 150–400)
RBC: 4.45 MIL/uL (ref 3.87–5.11)
RDW: 14.5 % (ref 11.5–15.5)
WBC: 8 10*3/uL (ref 4.0–10.5)

## 2013-08-03 LAB — I-STAT TROPONIN, ED: Troponin i, poc: 0 ng/mL (ref 0.00–0.08)

## 2013-08-03 NOTE — ED Notes (Signed)
Pt states that she started having CP this afternoon. Pt states that the pain radiates down her right arm and up to her right neck and causes her head to feel compressed and have a HA. Pt hyperventilating in triage.

## 2013-08-04 ENCOUNTER — Emergency Department (HOSPITAL_COMMUNITY)
Admission: EM | Admit: 2013-08-04 | Discharge: 2013-08-04 | Disposition: A | Discharge status: Home / Self Care | Payer: BC Managed Care – PPO | Attending: Emergency Medicine | Admitting: Emergency Medicine

## 2013-08-04 ENCOUNTER — Ambulatory Visit (INDEPENDENT_AMBULATORY_CARE_PROVIDER_SITE_OTHER): Payer: BC Managed Care – PPO | Admitting: Psychology

## 2013-08-04 DIAGNOSIS — F419 Anxiety disorder, unspecified: Secondary | ICD-10-CM

## 2013-08-04 DIAGNOSIS — E876 Hypokalemia: Secondary | ICD-10-CM

## 2013-08-04 DIAGNOSIS — R252 Cramp and spasm: Secondary | ICD-10-CM

## 2013-08-04 DIAGNOSIS — F411 Generalized anxiety disorder: Secondary | ICD-10-CM

## 2013-08-04 MED ORDER — POTASSIUM CHLORIDE CRYS ER 20 MEQ PO TBCR
40.0000 meq | EXTENDED_RELEASE_TABLET | Freq: Once | ORAL | Status: AC
  Administered 2013-08-04: 40 meq via ORAL
  Filled 2013-08-04: qty 2

## 2013-08-04 MED ORDER — KETOROLAC TROMETHAMINE 30 MG/ML IJ SOLN
30.0000 mg | Freq: Once | INTRAMUSCULAR | Status: AC
  Administered 2013-08-04: 30 mg via INTRAVENOUS
  Filled 2013-08-04: qty 1

## 2013-08-04 NOTE — ED Provider Notes (Signed)
CSN: [id number]     Arrival date & time 08/03/13  2211 History   First MD Initiated Contact with Patient 08/04/13 [phone]     Chief Complaint  Patient presents with  . Chest Pain  . Headache     (Consider location/radiation/quality/duration/timing/severity/associated sxs/prior Treatment) HPI Comments: And states, that this evening, after she returned from work, and do her chores.  She was resting in bed.  When she developed a cramp in the back of her right arm.  That radiated up into her shoulder and head causing her to have a headache since she arrived in the emergency room and she is also complaining of a cramp in her left chest she works as a Char[employer] fundraiser.  She has not taken any medication for her discomfort.  Denies shortness of breath, nausea, diaphoresis Denies any trauma, heavy lifting, previous symptoms of same  Patient is a 38 y.o. female presenting with chest pain and headaches. The history is provided by the patient.  Chest Pain Pain location:  R chest and L chest Pain quality: aching   Pain radiates to:  Does not radiate Pain radiates to the back: no   Pain severity:  Mild Onset quality:  Gradual Duration:  2 hours Timing:  Constant Progression:  Worsening Chronicity:  New Context: lifting, movement and raising an arm   Context: not breathing, no drug use, not eating and no intercourse   Relieved by:  Nothing Worsened by:  Certain positions and movement Ineffective treatments:  None tried Associated symptoms: headache   Associated symptoms: no back pain, no fever, no nausea and no shortness of breath   Headache Associated symptoms: myalgias   Associated symptoms: no back pain, no fever, no nausea, no neck pain and no neck stiffness     Past Medical History  Diagnosis Date  . Hypertension   . Hemorrhoids   . Anxiety   . Depression    Past Surgical History  Procedure Laterality Date  . Cesarean section  1998, 2003  . Wisdom tooth extraction     Family History   Problem Relation Age of Onset  . Alcohol abuse Mother   . Drug abuse Maternal Uncle   . Alcohol abuse Maternal Grandmother   . Drug abuse Maternal Grandmother    History  Substance Use Topics  . Smoking status: Never Smoker   . Smokeless tobacco: Not on file  . Alcohol Use: 1.2 oz/week    2 Cans of beer per week     Comment: occ   OB History   Grav Para Term Preterm Abortions TAB SAB Ect Mult Living                 Review of Systems  Constitutional: Negative for fever and chills.  Respiratory: Negative for shortness of breath.   Cardiovascular: Positive for chest pain. Negative for leg swelling.  Gastrointestinal: Negative for nausea.  Musculoskeletal: Positive for myalgias. Negative for arthralgias, back pain, neck pain and neck stiffness.  Skin: Negative for rash and wound.  Neurological: Positive for headaches.      Allergies  Zoloft  Home Medications   Prior to Admission medications   Medication Sig Start Date End Date Taking? Authorizing Provider  amLODipine (NORVASC) 10 MG tablet take 1 tablet by mouth once daily 07/28/13  Yes Amber Fidel Levy, MD  camphor-menthol [facility]) lotion Apply topically as needed for itching. 10/11/12  Yes Coral Spikes, DO  Doxylamine Succinate, Sleep, (SLEEP AID PO) Take 2 tablets by mouth  at bedtime as needed (sleep).   Yes Historical Provider, MD  hydrochlorothiazide (HYDRODIURIL) 25 MG tablet Take 1 tablet (25 mg total) by mouth daily. 07/28/13  Yes Amber Fidel Levy, MD  hydrOXYzine (ATARAX/VISTARIL) 10 MG tablet Take 1 tablet (10 mg total) by mouth 3 (three) times daily as needed for anxiety. 06/14/13  Yes Meghan Blankmann, NP  ibuprofen (ADVIL,MOTRIN) 200 MG tablet Take 600 mg by mouth every 6 (six) hours as needed for moderate pain.   Yes Historical Provider, MD  losartan (COZAAR) 25 MG tablet take 1 tablet by mouth once daily 07/28/13  Yes Amber Fidel Levy, MD  omeprazole (PRILOSEC) 40 MG capsule take 1 capsule by mouth once daily  07/28/13  Yes Amber Fidel Levy, MD  risperiDONE (RISPERDAL) 1 MG tablet Take 1.5 tablets (1.5 mg total) by mouth daily. 07/28/13 07/28/14 Yes Amber Fidel Levy, MD   BP 104/73  Pulse 83  Temp(Src) 97.7 F (36.5 C) (Oral)  Resp 16  Ht 5\' 1"  (1.549 m)  Wt 202 lb (91.627 kg)  BMI 38.19 kg/m2  SpO2 98%  LMP 07/21/2013 Physical Exam  Vitals reviewed. Constitutional: She appears well-developed and well-nourished.  HENT:  Head: Normocephalic.  Mouth/Throat: Oropharynx is clear and moist.  Eyes: Pupils are equal, round, and reactive to light.  Neck: Normal range of motion.  Cardiovascular: Normal rate and regular rhythm.   Pulmonary/Chest: Effort normal and breath sounds normal. She exhibits tenderness.  He should has reproducible pain across her upper chest bilaterally  Abdominal: Soft. She exhibits distension. There is no tenderness.  Musculoskeletal: Normal range of motion. She exhibits no edema and no tenderness.  No rash, discoloration, or muscle tenderness on examination of the right upper arm.  Full range of motion at the shoulder, and elbow, positive distal pulses  Lymphadenopathy:    She has no cervical adenopathy.  Neurological: She is alert.  Skin: Skin is warm. No rash noted. No erythema.    ED Course  Procedures (including critical care time) Labs Review Labs Reviewed  CBC WITH DIFFERENTIAL - Abnormal; Notable for the following:    Hemoglobin 11.8 (*)    HCT 35.3 (*)    Eosinophils Relative 9 (*)    All other components within normal limits  COMPREHENSIVE METABOLIC PANEL - Abnormal; Notable for the following:    Sodium 134 (*)    Potassium 3.2 (*)    GFR calc non Af Amer 79 (*)    All other components within normal limits  Randolm Idol, ED    Imaging Review Dg Chest 2 View  08/04/2013   CLINICAL DATA:  Left-sided chest pain.  Headache.  EXAM: CHEST  2 VIEW  COMPARISON:  No priors.  FINDINGS: Lung volumes are normal. No consolidative airspace disease. No  pleural effusions. No pneumothorax. No pulmonary nodule or mass noted. Pulmonary vasculature and the cardiomediastinal silhouette are within normal limits.  IMPRESSION: No radiographic evidence of acute cardiopulmonary disease.   Electronically Signed   By: Vinnie Langton M.D.   On: 08/04/2013 00:49     EKG Interpretation None      Date: 08/04/2013  Rate: 83  Rhythm: normal sinus rhythm  QRS Axis: normal  Intervals: normal  ST/T Wave abnormalities: normal  Conduction Disutrbances:none  Narrative Interpretation:   Old EKG Reviewed: none available   MDM  Hypokalemia has been supplemented with 40 of potassium by, mouth.  She's also been given instructions to follow potassium rich food at home, and to followup with her primary  care physician Final diagnoses:  Muscle cramping  Hypokalemia        Garald Balding, NP 08/04/13 0400

## 2013-08-04 NOTE — Progress Notes (Signed)
   THERAPIST PROGRESS NOTE  Session Time: 12.30pm-1:15pm  Participation Level: Active  Behavioral Response: Well GroomedAlertAnxious  Type of Therapy: Individual Therapy  Treatment Goals addressed: Diagnosis: Anxiety D/O NOS and goal 1.  Interventions: CBT, Strength-based and Supportive  Summary: AZHIA SIEFKEN is a 38 y.o. female who presents with report of feeling tired today.  Pt reported that she went to the ER last night as began having pain in her arm that moved up her neck.  Pt reported she thought that maybe she was experiencing a stroke and this caused anxiety and she did have panic attack.  She informed that the ER dx w/ muscle spasms that occurred from low potasium levels. Pt reported that she was feeling bad that her fiance was with her all night and didn't get much sleep b/f going to work and identified negative self statements that she wasn't worth that.  Pt was able to challenge these and recognize he cares and she does have worth, would do the same.  Pt reported on some stress w/ the closing on the house that was extended and should be final next week. Pt also reported that she has decided to change jobs due to stress level at current job.  Pt discussed potential opportunities that have presented.  Pt also reports on other positives occuring in her life w/ positive interactions w/ children and fiance.  Pt increased awareness of positives in her life as she stated I just realized I had nothing to complain about- only positives.  Suicidal/Homicidal: Nowithout intent/plan  Therapist Response: Assessed pt current functioning per pt report.  Processed w/pt stressor of ER visit and reflected to pt negative self talk and assisted in challenging and reiterated her self worth.  Explored w/pt strengths of changes and positive self care that she has been enacting in her life.   Plan: Return again in 4 weeks.  Pt to f/u w/ Madison Hickman, NP.  Diagnosis: Axis I: Anxiety Disorder  NOS    Axis II: No diagnosis    Amamda Curbow, LPC 08/04/2013

## 2013-08-04 NOTE — Discharge Instructions (Signed)
Muscle Cramps and Spasms Muscle cramps and spasms occur when a muscle or muscles tighten and you have no control over this tightening (involuntary muscle contraction). They are a common problem and can develop in any muscle. The most common place is in the calf muscles of the leg. Both muscle cramps and muscle spasms are involuntary muscle contractions, but they also have differences:   Muscle cramps are sporadic and painful. They may last a few seconds to a quarter of an hour. Muscle cramps are often more forceful and last longer than muscle spasms.  Muscle spasms may or may not be painful. They may also last just a few seconds or much longer. CAUSES  It is uncommon for cramps or spasms to be due to a serious underlying problem. In many cases, the cause of cramps or spasms is unknown. Some common causes are:   Overexertion.   Overuse from repetitive motions (doing the same thing over and over).   Remaining in a certain position for a long period of time.   Improper preparation, form, or technique while performing a sport or activity.   Dehydration.   Injury.   Side effects of some medicines.   Abnormally low levels of the salts and ions in your blood (electrolytes), especially potassium and calcium. This could happen if you are taking water pills (diuretics) or you are pregnant.  Some underlying medical problems can make it more likely to develop cramps or spasms. These include, but are not limited to:   Diabetes.   Parkinson disease.   Hormone disorders, such as thyroid problems.   Alcohol abuse.   Diseases specific to muscles, joints, and bones.   Blood vessel disease where not enough blood is getting to the muscles.  HOME CARE INSTRUCTIONS   Stay well hydrated. Drink enough water and fluids to keep your urine clear or pale yellow.  It may be helpful to massage, stretch, and relax the affected muscle.  For tight or tense muscles, use a warm towel, heating  pad, or hot shower water directed to the affected area.  If you are sore or have pain after a cramp or spasm, applying ice to the affected area may relieve discomfort.  Put ice in a plastic bag.  Place a towel between your skin and the bag.  Leave the ice on for 15-20 minutes, 03-04 times a day.  Medicines used to treat a known cause of cramps or spasms may help reduce their frequency or severity. Only take over-the-counter or prescription medicines as directed by your caregiver. SEEK MEDICAL CARE IF:  Your cramps or spasms get more severe, more frequent, or do not improve over time.  MAKE SURE YOU:   Understand these instructions.  Will watch your condition.  Will get help right away if you are not doing well or get worse. Document Released: 08/22/2001 Document Revised: 06/27/2012 Document Reviewed: 02/17/2012 [facility] Patient Information 2014 [place], [place].  Hypokalemia Hypokalemia means that the amount of potassium in the blood is lower than normal.Potassium is a chemical, called an electrolyte, that helps regulate the amount of fluid in the body. It also stimulates muscle contraction and helps nerves function properly.Most of the body's potassium is inside of cells, and only a very small amount is in the blood. Because the amount in the blood is so small, minor changes can be life-threatening. CAUSES  Antibiotics.  Diarrhea or vomiting.  Using laxatives too much, which can cause diarrhea.  Chronic kidney disease.  Water pills (diuretics).  Eating disorders (bulimia).  Low magnesium level.  Sweating a lot. SIGNS AND SYMPTOMS  Weakness.  Constipation.  Fatigue.  Muscle cramps.  Mental confusion.  Skipped heartbeats or irregular heartbeat (palpitations).  Tingling or numbness. DIAGNOSIS  Your health care provider can diagnose hypokalemia with blood tests. In addition to checking your potassium level, your health care provider may also check other lab  tests. TREATMENT Hypokalemia can be treated with potassium supplements taken by mouth or adjustments in your current medicines. If your potassium level is very low, you may need to get potassium through a vein (IV) and be monitored in the hospital. A diet high in potassium is also helpful. Foods high in potassium are:  Nuts, such as peanuts and pistachios.  Seeds, such as sunflower seeds and pumpkin seeds.  Peas, lentils, and lima beans.  Whole grain and bran cereals and breads.  Fresh fruit and vegetables, such as apricots, avocado, bananas, cantaloupe, kiwi, oranges, tomatoes, asparagus, and potatoes.  Orange and tomato juices.  Red meats.  Fruit yogurt. HOME CARE INSTRUCTIONS  Take all medicines as prescribed by your health care provider.  Maintain a healthy diet by including nutritious food, such as fruits, vegetables, nuts, whole grains, and lean meats.  If you are taking a laxative, be sure to follow the directions on the label. SEEK MEDICAL CARE IF:  Your weakness gets worse.  You feel your heart pounding or racing.  You are vomiting or having diarrhea.  You are diabetic and having trouble keeping your blood glucose in the normal range. SEEK IMMEDIATE MEDICAL CARE IF:  You have chest pain, shortness of breath, or dizziness.  You are vomiting or having diarrhea for more than 2 days.  You faint. MAKE SURE YOU:   Understand these instructions.  Will watch your condition.  Will get help right away if you are not doing well or get worse. Document Released: 03/02/2005 Document Revised: 12/21/2012 Document Reviewed: 09/02/2012 [facility] Patient Information 2014 [place].  Potassium Content of Foods Potassium is a mineral found in many foods and drinks. It helps keep fluids and minerals balanced in your body and also affects how steadily your heart beats. The body needs potassium to control blood pressure and to keep the muscles and nervous system healthy.  However, certain health conditions and medicine may require you to eat more or less potassium-rich foods and drinks. Your caregiver or dietitian will tell you how much potassium you should have each day. COMMON SERVING SIZES The list below tells you how big or small common portion sizes are:  1 oz.........4 stacked dice.  3 oz........Marland KitchenDeck of cards.  1 tsp.......Marland KitchenTip of little finger.  1 tbsp....Marland KitchenMarland KitchenThumb.  2 tbsp....Marland KitchenMarland KitchenGolf ball.   c..........Marland KitchenHalf of a fist.  1 c...........Marland KitchenA fist. FOODS AND DRINKS HIGH IN POTASSIUM More than 200 mg of potassium per serving. A serving size is  c (120 mL or noted gram weight) unless otherwise stated. While all the items on this list are high in potassium, some items are higher in potassium than others. Fruits  Apricots (sliced), 83 g.  Apricots (dried halves), 3 oz / 24 g.  Avocado (cubed),  c / 50 g.  Banana (sliced), 75 g.  Cantaloupe (cubed), 80 g.  Dates (pitted), 5 whole / 35 g.  Figs (dried), 4 whole / 32 g.  Guava, c / 55 g.  Honeydew, 1 wedge / 85 g.  Kiwi (sliced), 90 g.  Nectarine, 1 small / 129 g.  Orange, 1 medium / 131  g.  Orange juice.  Pomegranate seeds, 87 g.  Pomegranate juice.  Prunes (pitted), 3 whole / 30 g.  Prune juice, 3 oz / 90 mL.  Seedless raisins, 3 tbsp / 27 g. Vegetables  Artichoke,  of a medium / 64 g.  Asparagus (boiled), 90 g.  Baked beans,  c / 63 g.  Bamboo shoots,  c / 38 g.  Beets (cooked slices), 85 g.  Broccoli (boiled), 78 g.  Brussels sprout (boiled), 78 g.  Butternut squash (baked), 103 g.  Chickpea (cooked), 82 g.  Green peas (cooked), 80 g.  Hubbard squash (baked cubes),  c / 68 g.  Kidney beans (cooked), 5 tbsp / 55 g.  Lima beans (cooked),  c / 43 g.  Navy beans (cooked),  c / 61 g.  Potato (baked), 61 g.  Potato (boiled), 78 g.  Pumpkin (boiled), 123 g.  Refried beans,  c / 79 g.  Spinach (cooked),  c / 45 g.  Split peas (cooked),  c / 65  g.  Sun-dried tomatoes, 2 tbsp / 7 g.  Sweet potato (baked),  c / 50 g.  Tomato (chopped or sliced), 90 g.  Tomato juice.  Tomato paste, 4 tsp / 21 g.  Tomato sauce,  c / 61 g.  Vegetable juice.  White mushrooms (cooked), 78 g.  Yam (cooked or baked),  c / 34 g.  Zucchini squash (boiled), 90 g. Other Foods and Drinks  Almonds (whole),  c / 36 g.  Cashews (oil roasted),  c / 32 g.  Chocolate milk.  Chocolate pudding, 142 g.  Clams (steamed), 1.5 oz / 43 g.  Dark chocolate, 1.5 oz / 42 g.  Fish, 3 oz / 85 g.  King crab (steamed), 3 oz / 85 g.  Lobster (steamed), 4 oz / 113 g.  Milk (skim, 1%, 2%, whole), 1 c / 240 mL.  Milk chocolate, 2.3 oz / 66 g.  Milk shake.  Nonfat fruit variety yogurt, 123 g.  Peanuts (oil roasted), 1 oz / 28 g.  Peanut butter, 2 tbsp / 32 g.  Pistachio nuts, 1 oz / 28 g.  Pumpkin seeds, 1 oz / 28 g.  Red meat (broiled, cooked, grilled), 3 oz / 85 g.  Scallops (steamed), 3 oz / 85 g.  Shredded wheat cereal (dry), 3 oblong biscuits / 75 g.  Spaghetti sauce,  c / 66 g.  Sunflower seeds (dry roasted), 1 oz / 28 g.  Veggie burger, 1 patty / 70 g. FOODS MODERATE IN POTASSIUM Between 150 mg and 200 mg per serving. A serving is  c (120 mL or noted gram weight) unless otherwise stated. Fruits  Grapefruit,  of the fruit / 123 g.  Grapefruit juice.  Pineapple juice.  Plums (sliced), 83 g.  Tangerine, 1 large / 120 g. Vegetables  Carrots (boiled), 78 g.  Carrots (sliced), 61 g.  Rhubarb (cooked with sugar), 120 g.  Rutabaga (cooked), 120 g.  Sweet corn (cooked), 75 g.  Yellow snap beans (cooked), 63 g. Other Foods and Drinks   Bagel, 1 bagel / 98 g.  Chicken breast (roasted and chopped),  c / 70 g.  Chocolate ice cream / 66 g.  Pita bread, 1 large / 64 g.  Shrimp (steamed), 4 oz / 113 g.  Swiss cheese (diced), 70 g.  Vanilla ice cream, 66 g.  Vanilla pudding, 140 g. FOODS LOW IN  POTASSIUM Less than 150 mg per serving. A serving  size is  cup (120 mL or noted gram weight) unless otherwise stated. If you eat more than 1 serving of a food low in potassium, the food may be considered a food high in potassium. Fruits  Apple (slices), 55 g.  Apple juice.  Applesauce, 122 g.  Blackberries, 72 g.  Blueberries, 74 g.  Cranberries, 50 g.  Cranberry juice.  Fruit cocktail, 119 g.  Fruit punch.  Grapes, 46 g.  Grape juice.  Mandarin oranges (canned), 126 g.  Peach (slices), 77 g.  Pineapple (chunks), 83 g.  Raspberries, 62 g.  Red cherries (without pits), 78 g.  Strawberries (sliced), 83 g.  Watermelon (diced), 76 g. Vegetables  Alfalfa sprouts, 17 g.  Bell peppers (sliced), 46 g.  Cabbage (shredded), 35 g.  Cauliflower (boiled), 62 g.  Celery, 51 g.  Collard greens (boiled), 95 g.  Cucumber (sliced), 52 g.  Eggplant (cubed), 41 g.  Green beans (boiled), 63 g.  Lettuce (shredded), 1 c / 36 g.  Onions (sauteed), 44 g.  Radishes (sliced), 58 g.  Spaghetti squash, 51 g. Other Foods and Drinks  [employer], 1 slice / 28 g.  Black tea.  Brown rice (cooked), 98 g.  Butter croissant, 1 medium / 57 g.  Carbonated soda.  Coffee.  Cheddar cheese (diced), 66 g.  Corn flake cereal (dry), 14 g.  Cottage cheese, 118 g.  Cream of rice cereal (cooked), 122 g.  Cream of wheat cereal (cooked), 126 g.  Crisped rice cereal (dry), 14 g.  Egg (boiled, fried, poached, omelet, scrambled), 1 large / 46 61 g.  English muffin, 1 muffin / 57 g.  Frozen ice pop, 1 pop / 55 g.  Graham cracker, 1 large rectangular cracker / 14 g.  Jelly beans, 112 g.  Non-dairy whipped topping.  Oatmeal, 88 g.  Orange sherbet, 74 g.  Puffed rice cereal (dry), 7 g.  Pasta (cooked), 70 g.  Rice cakes, 4 cakes / 36 g.  Sugared doughnut, 4 oz / 116 g.  White bread, 1 slice / 30 g.  White rice (cooked), 79 93 g.  Wild rice (cooked),  82 g.  Yellow cake, 1 slice / 68 g. Document Released: 10/14/2004 Document Revised: 02/17/2012 Document Reviewed: 07/17/2011 [facility] Patient Information 2014 [place]. Make an appointment with your PCP for follow up

## 2013-08-04 NOTE — ED Provider Notes (Signed)
Medical screening examination/treatment/procedure(s) were performed by non-physician practitioner and as supervising physician I was immediately available for consultation/collaboration.   EKG Interpretation None       Teressa Lower, MD 08/04/13 [phone]

## 2013-08-10 ENCOUNTER — Ambulatory Visit (HOSPITAL_COMMUNITY): Payer: Self-pay | Admitting: Psychiatry

## 2013-08-14 ENCOUNTER — Encounter: Payer: Self-pay | Admitting: Family Medicine

## 2013-08-14 ENCOUNTER — Ambulatory Visit (INDEPENDENT_AMBULATORY_CARE_PROVIDER_SITE_OTHER): Payer: BC Managed Care – PPO | Admitting: Family Medicine

## 2013-08-14 VITALS — BP 129/91 | HR 88 | Temp 98.7°F | Wt 203.0 lb

## 2013-08-14 DIAGNOSIS — F411 Generalized anxiety disorder: Secondary | ICD-10-CM

## 2013-08-14 DIAGNOSIS — F419 Anxiety disorder, unspecified: Secondary | ICD-10-CM

## 2013-08-14 NOTE — Progress Notes (Signed)
Patient ID: ORPHIA MCTIGUE, female   DOB: [birth year], 38 y.o.   MRN: [id number]    Subjective: HPI: Patient is a 38 y.o. female presenting to clinic today for FMLA paperwork.  Patient is presenting for FMLA paperwork. 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 She has anxiety and depression which is made worse by stress. She feels like when she is having anxiety attacks, she is unable to complete her job. She takes anywhere from 1 to 8 days off per month based on her mental health. She is still followed by behavioral health and sees a therapist. Well controlled on current medications   History Reviewed: Non smoker. Health Maintenance: UTD  ROS: Please see HPI above.  Objective: Office vital signs reviewed. BP 129/91  Pulse 88  Temp(Src) 98.7 F (37.1 C) (Oral)  Wt 203 lb (92.08 kg)  LMP 08/09/2013  Physical Examination:  General: Awake, alert. NAD HEENT: Atraumatic, normocephalic Neck: No masses palpated. No LAD Pulm: CTAB, no wheezes Cardio: RRR, no murmurs appreciated Abdomen:+BS, soft, nontender, nondistended Extremities: No edema Neuro: Grossly intact Psych: Happy, makes eye contact. Speaks in normal sentences  Assessment: 38 y.o. female FMLA paperwork  Plan: See Problem List and After Visit Summary

## 2013-08-14 NOTE — Assessment & Plan Note (Signed)
A: Stable at this time. Requested FMLA for days when she has a flare.  P: - Paperwork completed - Physical limitations form completed based on normal physical exam - F/u with behavioral health  - F/u with me as needed

## 2013-08-14 NOTE — Patient Instructions (Signed)
Thank you for coming in today.  Good luck with your new job, but hopefully this FMLA will help.  We will see you back as scheduled.  Berdell Nevitt M. Kylie Simmonds, M.D.

## 2013-08-21 ENCOUNTER — Encounter: Payer: Self-pay | Admitting: *Deleted

## 2013-08-21 NOTE — Progress Notes (Signed)
Pt dropped off another medical form to be completed by PCP.  Pt stated she had one already completed, but this is for a different job.  Form placed in provider for review.  Derl Barrow, RN

## 2013-08-22 NOTE — Progress Notes (Signed)
Returned to blue team.  Wendy Duffy. Shanita Kanan, M.D.

## 2013-08-23 NOTE — Progress Notes (Signed)
Pt is aware that form is completed and up front for pick up.  Jazmin Hartsell,CMA

## 2013-08-28 ENCOUNTER — Ambulatory Visit (HOSPITAL_COMMUNITY): Payer: Self-pay | Admitting: Psychology

## 2013-09-18 ENCOUNTER — Ambulatory Visit (HOSPITAL_COMMUNITY): Payer: Self-pay | Admitting: Psychology

## 2013-12-22 ENCOUNTER — Other Ambulatory Visit: Payer: Self-pay | Admitting: *Deleted

## 2013-12-22 MED ORDER — AMLODIPINE BESYLATE 10 MG PO TABS: ORAL_TABLET | ORAL | Status: DC

## 2013-12-22 MED ORDER — LOSARTAN POTASSIUM 25 MG PO TABS: ORAL_TABLET | ORAL | Status: DC

## 2014-01-02 ENCOUNTER — Encounter (HOSPITAL_COMMUNITY): Payer: Self-pay | Admitting: Psychology

## 2014-01-02 DIAGNOSIS — F419 Anxiety disorder, unspecified: Secondary | ICD-10-CM

## 2014-01-02 NOTE — Progress Notes (Signed)
Wendy Duffy is a 38 y.o. female patient discharge from counseling as last attended on 08/04/13.  Outpatient Therapist Discharge Summary  [name]    06/11/1975   Admission Date: 06/06/13    Discharge Date:  01/02/14 Reason for Discharge:  Not active in tx Diagnosis:   Anxiety    Comments:  Pt no showed for f/u appointment and didn't reschedule.   Delle Reining, LPC

## 2014-01-05 MED ORDER — LOSARTAN POTASSIUM 25 MG PO TABS: ORAL_TABLET | ORAL | Status: DC

## 2014-01-05 NOTE — Telephone Encounter (Signed)
Received a refill request from [facility] stating that they did not received refill from 12/22/2013.  Refill sent back in to [facility].  Derl Barrow, RN

## 2014-01-05 NOTE — Addendum Note (Signed)
Addended by: Derl Barrow on: 01/05/2014 10:35 AM   Modules accepted: Orders

## 2014-01-17 ENCOUNTER — Telehealth: Payer: Self-pay | Admitting: Family Medicine

## 2014-01-17 NOTE — Telephone Encounter (Signed)
Pt states that her pharmacy advised her that the refill request for amlodipine and losartan were both denied. I do not see any refill request for these meds recently. Please call patient and leave a detailed message, as patient will not be able to answer the phone (at work), as to why these were denied. Pls advise.

## 2014-01-17 NOTE — Telephone Encounter (Signed)
Pharmacy never received rx sent over 12/22/13.  Verbal rx left on VM for pharmacy at [place].  Left detailed message per patient request.  Wendy Duffy

## 2014-01-31 ENCOUNTER — Ambulatory Visit (INDEPENDENT_AMBULATORY_CARE_PROVIDER_SITE_OTHER): Payer: 59 | Admitting: *Deleted

## 2014-01-31 ENCOUNTER — Encounter: Payer: Self-pay | Admitting: Family Medicine

## 2014-01-31 ENCOUNTER — Ambulatory Visit (INDEPENDENT_AMBULATORY_CARE_PROVIDER_SITE_OTHER): Payer: 59 | Admitting: Family Medicine

## 2014-01-31 VITALS — BP 163/105 | HR 82 | Temp 98.2°F | Ht 61.0 in | Wt 210.6 lb

## 2014-01-31 DIAGNOSIS — G43909 Migraine, unspecified, not intractable, without status migrainosus: Secondary | ICD-10-CM | POA: Insufficient documentation

## 2014-01-31 DIAGNOSIS — Z23 Encounter for immunization: Secondary | ICD-10-CM

## 2014-01-31 MED ORDER — DEXAMETHASONE SODIUM PHOSPHATE 10 MG/ML IJ SOLN
10.0000 mg | Freq: Once | INTRAMUSCULAR | Status: AC
  Administered 2014-01-31: 10 mg via INTRAMUSCULAR

## 2014-01-31 MED ORDER — KETOROLAC TROMETHAMINE 30 MG/ML IJ SOLN
30.0000 mg | Freq: Once | INTRAMUSCULAR | Status: AC
  Administered 2014-01-31: 30 mg via INTRAMUSCULAR

## 2014-01-31 NOTE — Patient Instructions (Signed)
Head Ache - It sounds like you may be having a migraine headache today.  Treatment - you should: we will give you a "migraine cocktail" of shots of a few medications here in clinic. You can also continue to use tylenol, excedrin migraine, but most importantly try and get some sleep. If you continue to get these headaches please come back to the clinic and we will talk about additional options for treatment or prevention. You should be better in: 1 day, if headache persists or you get worsened symptoms please call Call us or go to the ER if you have high fever, weakness in an arm or leg, change in vision, persistent vomiting  Come back to see [place] if headache does not resolve in 1-2 days, and also come back for your yearly physical and pap smear.

## 2014-01-31 NOTE — Assessment & Plan Note (Signed)
Describing typical migraine headache. Hx as younger child, but this is first since. Neuro exam normal.  Plan: toradol, decadron in office. OTC analgesia, sleep; f/u in clinic if not improved or become more frequent for prevention discussion.

## 2014-01-31 NOTE — Progress Notes (Signed)
   Subjective:    Patient ID: Wendy Duffy, female    DOB: 1975/06/21, 38 y.o.   MRN: [id number]  HPI  Patient presents for Same Day Appointment  CC: HEADACHE  Headache started this morning Pain is squeezing Severity:  9/10 Location: front and under eyes Medications tried: goody powder Head trauma: no Sudden onset: yes Previous similar headaches: when she was younger Taking blood thinners: no History of cancer: no  Symptoms Nose congestion stuffiness:  no Nausea vomiting: nausea Photophobia: yes Noise sensitivity: yes, talking Double vision or loss of vision: no Fever: no Neck Stiffness: no Trouble walking or speaking: no Dizziness: yes Worse with moving, "focusing"  Patient thinks cause of headache might be: unsure  She recently restarted risperdal 2 weeks ago, and losartan/amlodipine yesterday  Review of Systems   See HPI for ROS. All other systems reviewed and are negative.  Past medical history, surgical, family, and social history reviewed and updated in the EMR as appropriate. Smoking history noted Objective:  BP 163/105 mmHg  Pulse 82  Temp(Src) 98.2 F (36.8 C) (Oral)  Ht 5\' 1"  (1.549 m)  Wt 210 lb 9.6 oz (95.528 kg)  BMI 39.81 kg/m2  LMP 01/31/2014 Vitals reviewed  General: NAD HEENT: PERRL, EOMI, optic discs sharp.  CV: RRR, normal s1s2, no murmur Resp: CTAB, normal effort Neuro: alert and oriented, no deficits noted. No pronator drift. Normal cerebellar testing.   Assessment & Plan:  See Problem List Documentation

## 2014-03-06 ENCOUNTER — Encounter: Payer: Self-pay | Admitting: Family Medicine

## 2014-03-06 ENCOUNTER — Ambulatory Visit (INDEPENDENT_AMBULATORY_CARE_PROVIDER_SITE_OTHER): Payer: 59 | Admitting: Family Medicine

## 2014-03-06 VITALS — BP 129/83 | HR 93 | Temp 98.3°F | Wt 212.0 lb

## 2014-03-06 DIAGNOSIS — J329 Chronic sinusitis, unspecified: Secondary | ICD-10-CM | POA: Insufficient documentation

## 2014-03-06 DIAGNOSIS — A499 Bacterial infection, unspecified: Secondary | ICD-10-CM

## 2014-03-06 DIAGNOSIS — B9689 Other specified bacterial agents as the cause of diseases classified elsewhere: Secondary | ICD-10-CM | POA: Insufficient documentation

## 2014-03-06 DIAGNOSIS — R0683 Snoring: Secondary | ICD-10-CM

## 2014-03-06 MED ORDER — AMOXICILLIN-POT CLAVULANATE 875-125 MG PO TABS: 1.0000 | ORAL_TABLET | Freq: Two times a day (BID) | ORAL | Status: DC

## 2014-03-06 NOTE — Patient Instructions (Addendum)
This sounds like a bacterial sinusitis at this point. Continue using the nasocort daily. Use nasal saline rinse or neti pot daily. Drink plenty of warm fluids and get plenty of rest. Take antibiotic daily. Follow up in 1 week if not improving. Seek immediate care if high fevers, chills, unable to breathe or maintain hydration, or other concerns. After this is treated, if you are still having significant snoring, come back and we can discuss sleep study.  Best,  Hilton Sinclair, MD

## 2014-03-06 NOTE — Progress Notes (Signed)
Patient ID: Wendy Duffy, female   DOB: 04/08/1975, 38 y.o.   MRN: [id number]  Subjective:   CC: Sinus pressure and pain  HPI:   Sinus pressure and nasal congestion x 2 months, using alka-seltzer with little relief. No fevers/chills objectively but subjectively so. Nasal congestion, not finding relief with mucinex. Greenish sputum, blood tinged. Ear not draining. Today left cheek and into ear has been sharply painful. Has had pressure in face maxillary and frontal area. Mucinex, humidifier, and alka seltzer have helped short-term. Equate cough and congestion not helping. Has occasional coughing with production of green sputum. No throat pain and itching. Some tickling from drainage. No joint pain. Some increased fatigue. Increased snoring. No diarrhea, vomiting, stomach pain, rash. Has had some chest discomfort left upper chest into mid-chest that is random. No dyspnea.  Snoring has also chronically been an issue and she wonders if a sleep study would help see if she has sleep apnea. She had one of these in ?2011 that was negative for sleep apnea reportedly. Husband states that for 2 months while sick she has been snoring worse.  Review of Systems - Per HPI.  PMH - LVH, sleep disorder with snoring, rash, obesity, migraine, HTN, gastritis, depression, birth control, anxiety  Smoking status: never smoker    Objective:  Physical Exam BP 129/83 mmHg  Pulse 93  Temp(Src) 98.3 F (36.8 C) (Oral)  Wt 212 lb (96.163 kg)  LMP 02/26/2014 (Exact Date) GEN: NAD CV: RRR, no m/r/g, 2+ B radial pulses PULM: CTAB, normal effort ABD: obese, soft/nontender EXTR: No LE edema or calf tenderness HEENT: AT/[place], sclera clear, EOMI, o/p clear though cannot see o/p without pt vocalizing to raise soft palate, TMs dull bilaterally, nares inflamed and mildly red with no purulence, neck supple, tender to palpation of frontal and maxillary sinuses, no LAD    Assessment:     Wendy Duffy is a 38 y.o.  female here for sinus pain and pressure x 2 months.    Plan:     # See problem list and after visit summary for problem-specific plans.   # Health Maintenance: Not discussed. Flu shot given this year.  Follow-up: Follow up in 1 week PRN lack of improvement in sinusitis.   Hilton Sinclair, MD [place]

## 2014-03-06 NOTE — Assessment & Plan Note (Signed)
Heavy snoring likely some component of sleep apnea given unable to see o/p when opens mouth unless vocalizes. Worse snoring now likely due to sinusitis so will hold off on referral. Prior sleep study neg for sleep apnea. - Return to discuss sleep sstudy referral if not improved after sinusitis treated. - Work on weight loss.

## 2014-03-06 NOTE — Assessment & Plan Note (Signed)
Likely bacterial sinusitis at this point given sinus tenderness and dull TMs bilaterally. - Continue nasocort daily along with use nasal saline rinse or neti pot. - Augmentin BID x 10 days. - Fluids, rest. - F/u 1 week if not improving.  - Return precautions reviewed.

## 2014-03-23 ENCOUNTER — Telehealth: Payer: Self-pay | Admitting: Family Medicine

## 2014-03-23 DIAGNOSIS — N898 Other specified noninflammatory disorders of vagina: Secondary | ICD-10-CM

## 2014-03-23 MED ORDER — FLUCONAZOLE 150 MG PO TABS: 150.0000 mg | ORAL_TABLET | Freq: Once | ORAL | Status: DC

## 2014-03-23 NOTE — Telephone Encounter (Signed)
LM for patient regarding medication being sent to pharmacy with instructions.  (ok per ROI).  Jazmin Hartsell,CMA

## 2014-03-23 NOTE — Telephone Encounter (Signed)
Will forward to MD. Jazmin Hartsell,CMA  

## 2014-03-23 NOTE — Telephone Encounter (Signed)
Pt called because she was taking antibiotics and now has a yeast infection. Can we call something inf for her. jw

## 2014-03-23 NOTE — Telephone Encounter (Signed)
Please let her know I have sent in diflucan which she can take for itching and if symptoms do not get better in 3 days, she can take another dose. If still not improved or she has symptoms other than vaginal itching, she should make appointment to be seen.  Hilton Sinclair, MD

## 2014-04-16 ENCOUNTER — Other Ambulatory Visit: Payer: Self-pay | Admitting: Family Medicine

## 2014-04-16 ENCOUNTER — Telehealth: Payer: Self-pay | Admitting: Family Medicine

## 2014-04-16 MED ORDER — RISPERIDONE 1 MG PO TABS: 1.5000 mg | ORAL_TABLET | Freq: Every day | ORAL | Status: DC

## 2014-04-16 NOTE — Telephone Encounter (Signed)
Please let her know I have sent this in for 2 months. However, within that time she needs to get back in to see Behavioral Health because this is not a medication primary care typically prescribes and has lots of monitoring parameters. Last Cr, CBC, and LFTs 07/2013 reviewed and stable.  Hilton Sinclair, MD

## 2014-04-16 NOTE — Telephone Encounter (Signed)
Pt called because she would like a refill on her Risperidone. She usually gets this from [facility], but since her insurance is not in affect she can not be seen. Please call and let patient know. jw

## 2014-04-16 NOTE — Telephone Encounter (Signed)
Pt is aware that medication was sent to pharmacy.  She is trying to get back into [facility].  Pt will call back if she needs a new referral. Caili Escalera,CMA

## 2014-04-16 NOTE — Telephone Encounter (Signed)
Will forward to MD. Jazmin Hartsell,CMA  

## 2014-06-21 ENCOUNTER — Encounter (INDEPENDENT_AMBULATORY_CARE_PROVIDER_SITE_OTHER): Payer: Self-pay

## 2014-06-21 ENCOUNTER — Ambulatory Visit (INDEPENDENT_AMBULATORY_CARE_PROVIDER_SITE_OTHER): Payer: 59 | Admitting: Psychiatry

## 2014-06-21 ENCOUNTER — Encounter (HOSPITAL_COMMUNITY): Payer: Self-pay | Admitting: Psychiatry

## 2014-06-21 VITALS — BP 127/85 | HR 90 | Ht 61.0 in | Wt 219.6 lb

## 2014-06-21 DIAGNOSIS — F331 Major depressive disorder, recurrent, moderate: Secondary | ICD-10-CM | POA: Diagnosis not present

## 2014-06-21 DIAGNOSIS — F411 Generalized anxiety disorder: Secondary | ICD-10-CM | POA: Diagnosis not present

## 2014-06-21 DIAGNOSIS — Z79899 Other long term (current) drug therapy: Secondary | ICD-10-CM

## 2014-06-21 LAB — LIPID PANEL
CHOLESTEROL: 139 mg/dL (ref 0–200)
HDL: 46 mg/dL (ref >=46)
LDL Cholesterol: 77 mg/dL (ref 0–99)
Total CHOL/HDL Ratio: 3 Ratio
Triglycerides: 80 mg/dL (ref <150)
VLDL: 16 mg/dL (ref 0–40)

## 2014-06-21 LAB — CBC
HEMATOCRIT: 35.5 % — AB (ref 36.0–46.0)
HEMOGLOBIN: 11.2 g/dL — AB (ref 12.0–15.0)
MCH: 23.7 pg — AB (ref 26.0–34.0)
MCHC: 31.5 g/dL (ref 30.0–36.0)
MCV: 75.2 fL — ABNORMAL LOW (ref 78.0–100.0)
MPV: 10.6 fL (ref 8.6–12.4)
Platelets: 378 10*3/uL (ref 150–400)
RBC: 4.72 MIL/uL (ref 3.87–5.11)
RDW: 16.3 % — ABNORMAL HIGH (ref 11.5–15.5)
WBC: 5.5 10*3/uL (ref 4.0–10.5)

## 2014-06-21 LAB — COMPLETE METABOLIC PANEL WITH GFR
ALBUMIN: 4 g/dL (ref 3.5–5.2)
ALT: 19 U/L (ref 0–35)
AST: 17 U/L (ref 0–37)
Alkaline Phosphatase: 71 U/L (ref 39–117)
BUN: 13 mg/dL (ref 6–23)
CALCIUM: 9.2 mg/dL (ref 8.4–10.5)
CHLORIDE: 107 meq/L (ref 96–112)
CO2: 22 meq/L (ref 19–32)
Creat: 0.89 mg/dL (ref 0.50–1.10)
GFR, Est Non African American: 82 mL/min
Glucose, Bld: 71 mg/dL (ref 70–99)
POTASSIUM: 4.1 meq/L (ref 3.5–5.3)
Sodium: 139 mEq/L (ref 135–145)
TOTAL PROTEIN: 6.8 g/dL (ref 6.0–8.3)
Total Bilirubin: 0.4 mg/dL (ref 0.2–1.2)

## 2014-06-21 LAB — TSH: TSH: 0.554 u[IU]/mL (ref 0.350–4.500)

## 2014-06-21 MED ORDER — QUETIAPINE FUMARATE 50 MG PO TABS: 50.0000 mg | ORAL_TABLET | Freq: Every day | ORAL | Status: DC

## 2014-06-21 MED ORDER — VENLAFAXINE HCL ER 75 MG PO CP24: 75.0000 mg | ORAL_CAPSULE | Freq: Every day | ORAL | Status: DC

## 2014-06-21 NOTE — Patient Instructions (Signed)
1. Labs 2. EKG call [phone] to schedule
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
## 2014-06-22 LAB — HEMOGLOBIN A1C
Hgb A1c MFr Bld: 5 % (ref <5.7)
Mean Plasma Glucose: 97 mg/dL (ref <117)

## 2014-06-22 LAB — PROLACTIN: PROLACTIN: 15.8 ng/mL

## 2014-08-21 ENCOUNTER — Ambulatory Visit (INDEPENDENT_AMBULATORY_CARE_PROVIDER_SITE_OTHER): Payer: 59 | Admitting: Psychiatry

## 2014-08-21 ENCOUNTER — Encounter (HOSPITAL_COMMUNITY): Payer: Self-pay | Admitting: Psychiatry

## 2014-08-21 VITALS — BP 130/86 | HR 78 | Ht 61.0 in | Wt 217.2 lb

## 2014-08-21 DIAGNOSIS — F411 Generalized anxiety disorder: Secondary | ICD-10-CM

## 2014-08-21 DIAGNOSIS — F331 Major depressive disorder, recurrent, moderate: Secondary | ICD-10-CM | POA: Diagnosis not present

## 2014-08-21 MED ORDER — VENLAFAXINE HCL ER 75 MG PO CP24: 75.0000 mg | ORAL_CAPSULE | Freq: Every day | ORAL | Status: DC

## 2014-08-21 MED ORDER — QUETIAPINE FUMARATE 50 MG PO TABS: 50.0000 mg | ORAL_TABLET | Freq: Every day | ORAL | Status: DC
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
## 2014-09-12 ENCOUNTER — Other Ambulatory Visit: Payer: Self-pay | Admitting: Family Medicine

## 2014-09-13 ENCOUNTER — Other Ambulatory Visit: Payer: Self-pay | Admitting: *Deleted

## 2014-09-13 MED ORDER — AMLODIPINE BESYLATE 10 MG PO TABS: ORAL_TABLET | ORAL | Status: DC

## 2014-09-13 MED ORDER — LOSARTAN POTASSIUM 25 MG PO TABS: ORAL_TABLET | ORAL | Status: DC

## 2014-09-13 NOTE — Telephone Encounter (Signed)
Pt called and needs a refill on her BP medication called in. jw °

## 2014-09-13 NOTE — Telephone Encounter (Signed)
Letter mailed to patient. Koy Lamp,CMA  

## 2014-09-13 NOTE — Telephone Encounter (Signed)
I have filled this for 1 month. She is overdue for PCP f/u. Please notify her. Hilton Sinclair, MD

## 2014-09-14 ENCOUNTER — Ambulatory Visit (HOSPITAL_COMMUNITY): Payer: Self-pay | Admitting: Psychology

## 2014-09-27 ENCOUNTER — Encounter (HOSPITAL_COMMUNITY): Payer: Self-pay | Admitting: Psychology

## 2014-09-27 ENCOUNTER — Ambulatory Visit (INDEPENDENT_AMBULATORY_CARE_PROVIDER_SITE_OTHER): Payer: 59 | Admitting: Psychology

## 2014-09-27 DIAGNOSIS — F330 Major depressive disorder, recurrent, mild: Secondary | ICD-10-CM | POA: Diagnosis not present

## 2014-09-27 DIAGNOSIS — F411 Generalized anxiety disorder: Secondary | ICD-10-CM | POA: Diagnosis not present
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
## 2014-10-14 ENCOUNTER — Other Ambulatory Visit: Payer: Self-pay | Admitting: Family Medicine

## 2014-10-24 ENCOUNTER — Ambulatory Visit (INDEPENDENT_AMBULATORY_CARE_PROVIDER_SITE_OTHER): Payer: 59 | Admitting: Family Medicine

## 2014-10-24 ENCOUNTER — Ambulatory Visit: Payer: Self-pay | Admitting: Obstetrics and Gynecology

## 2014-10-24 VITALS — BP 146/94 | HR 78 | Temp 98.1°F | Wt 216.1 lb

## 2014-10-24 DIAGNOSIS — J029 Acute pharyngitis, unspecified: Secondary | ICD-10-CM | POA: Diagnosis not present

## 2014-10-24 DIAGNOSIS — J020 Streptococcal pharyngitis: Secondary | ICD-10-CM

## 2014-10-24 LAB — POCT RAPID STREP A (OFFICE): Rapid Strep A Screen: NEGATIVE

## 2014-10-24 MED ORDER — CETIRIZINE HCL 10 MG PO TABS: 10.0000 mg | ORAL_TABLET | Freq: Every day | ORAL | Status: DC

## 2014-10-24 NOTE — Patient Instructions (Signed)
Thank you for coming in,   I have written for Zyrtec to help with any postnasal drip.  Most likely her symptoms are from her upper respiratory tract infection.  If her symptoms do not resolve in 14 days the need to return for follow-up for further investigation.  Please bring all of your medications with you to each visit.    Please feel free to call with any questions or concerns at any time, at [phone]. --Dr. Raeford Razor

## 2014-10-25 DIAGNOSIS — J029 Acute pharyngitis, unspecified: Secondary | ICD-10-CM | POA: Insufficient documentation

## 2014-10-25 NOTE — Assessment & Plan Note (Signed)
Symptoms most consistent with a viral infection. No signs of a bacterial infection and rapid strep negative. -We'll give Zyrtec to help with postnasal drip and help alleviate cough. - If symptoms persists past 14 days the need of follow-up and reevaluate.

## 2014-10-25 NOTE — Progress Notes (Signed)
   Subjective:    Patient ID: Wendy Duffy, female    DOB: May 30, 1975, 39 y.o.   MRN: [id number]  Seen for Same day visit for   CC: sore throat   She has been having nasal congestion and a head cold for the past week. She is also having a dry cough. 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 She is having some achiness in her throat and reported fevers and chills. She feels like it's getting worse. She denies any trouble swallowing. She has a remote history of a strep throat infection about 4 or 5 years ago. 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  Review of Systems   See HPI for ROS. Objective:  BP 146/94 mmHg  Pulse 78  Temp(Src) 98.1 F (36.7 C) (Oral)  Wt 216 lb 1.6 oz (98.022 kg)  LMP 10/20/2014 (Exact Date)  General: NAD HEENT: Tympanic membranes intact bilaterally. No cervical lymphadenopathy. Clear conjunctiva. Extraocular movements intact. Oropharynx clear. No tonsillar exudates. Uvula midline. Cardiac: RRR, normal heart sounds, no murmurs.  Respiratory: CTAB, normal effort Skin: warm and dry, no rashes noted Neuro: alert, no focal deficits     Assessment & Plan:  See Problem List Documentation

## 2014-11-01 ENCOUNTER — Ambulatory Visit (HOSPITAL_COMMUNITY): Payer: Self-pay | Admitting: Psychology

## 2014-11-15 ENCOUNTER — Ambulatory Visit (INDEPENDENT_AMBULATORY_CARE_PROVIDER_SITE_OTHER): Payer: 59 | Admitting: Psychology

## 2014-11-15 DIAGNOSIS — F411 Generalized anxiety disorder: Secondary | ICD-10-CM | POA: Diagnosis not present

## 2014-11-15 DIAGNOSIS — F330 Major depressive disorder, recurrent, mild: Secondary | ICD-10-CM

## 2014-11-15 NOTE — Progress Notes (Signed)
   THERAPIST PROGRESS NOTE  Session Time: 3.26pm-4.20pm  Participation Level: Active  Behavioral Response: Well GroomedAlertAFFECT WNL  Type of Therapy: Individual Therapy  Treatment Goals addressed: Diagnosis: GAD, MDD and goal 1.  Interventions: CBT, Assertiveness Training and Supportive  Summary: Wendy Duffy is a 39 y.o. 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  Pt reported that she has been doing well w/ her mood overall.  Pt reported that stressors discussed last session are improved and that her son has come to live back w/ them and this has been positive transition.  Pt reported that adjusting to not seeing daughter frequently now that she has started college and adjusting to this. Pt reported felt hurt and disappointment when learned daughter lost virginity in past year.  Pt discussed want to have good communication w/ daughter and recognize can still accept daughter, that she hasn't failed as a mom and that can still support her towards daughter's goals.  Pt discussed does make her worry for her future.  Pt discussed disagreement and arguments having w/ her husband and was able to gain insight into how to communicate more effectively to prevent arguments and both have room for improvement.   Suicidal/Homicidal: Nowithout intent/plan  Therapist Response: Assessed pt current functioning per pt report. Processed w/pt her stressors and how she is working through w/ communicating and conflict resolution. Discussed how to practice acceptance of what doesn't have control over, compassion despite mistakes and supporting daughter and their relationship as she transitions to young adult. Reflected positive interactions and communication and assisted in seeing areas for improving assertive style  Plan: Return again in 4 weeks.  Diagnosis: MDD, mild and GAD    Kathlene Yano,  LPC 11/15/2014

## 2014-12-19 ENCOUNTER — Ambulatory Visit (HOSPITAL_COMMUNITY): Payer: Self-pay | Admitting: Psychology

## 2014-12-27 ENCOUNTER — Ambulatory Visit (HOSPITAL_COMMUNITY): Payer: Self-pay | Admitting: Psychiatry

## 2015-01-02 ENCOUNTER — Encounter: Payer: Self-pay | Admitting: Obstetrics and Gynecology

## 2015-01-02 ENCOUNTER — Other Ambulatory Visit (HOSPITAL_COMMUNITY)
Admission: RE | Admit: 2015-01-02 | Discharge: 2015-01-02 | Disposition: A | Discharge status: Home / Self Care | Payer: 59 | Source: Ambulatory Visit | Attending: Family Medicine | Admitting: Family Medicine

## 2015-01-02 ENCOUNTER — Ambulatory Visit (INDEPENDENT_AMBULATORY_CARE_PROVIDER_SITE_OTHER): Payer: 59 | Admitting: Psychology

## 2015-01-02 ENCOUNTER — Ambulatory Visit (INDEPENDENT_AMBULATORY_CARE_PROVIDER_SITE_OTHER): Payer: 59 | Admitting: Obstetrics and Gynecology

## 2015-01-02 VITALS — BP 143/95 | HR 87 | Temp 98.4°F | Ht 61.0 in | Wt 217.0 lb

## 2015-01-02 DIAGNOSIS — Z0000 Encounter for general adult medical examination without abnormal findings: Secondary | ICD-10-CM

## 2015-01-02 DIAGNOSIS — Z1151 Encounter for screening for human papillomavirus (HPV): Secondary | ICD-10-CM | POA: Diagnosis present

## 2015-01-02 DIAGNOSIS — R0683 Snoring: Secondary | ICD-10-CM | POA: Diagnosis not present

## 2015-01-02 DIAGNOSIS — Z124 Encounter for screening for malignant neoplasm of cervix: Secondary | ICD-10-CM | POA: Diagnosis not present

## 2015-01-02 DIAGNOSIS — F411 Generalized anxiety disorder: Secondary | ICD-10-CM | POA: Diagnosis not present

## 2015-01-02 DIAGNOSIS — F330 Major depressive disorder, recurrent, mild: Secondary | ICD-10-CM | POA: Diagnosis not present

## 2015-01-02 DIAGNOSIS — Z01419 Encounter for gynecological examination (general) (routine) without abnormal findings: Secondary | ICD-10-CM | POA: Diagnosis present

## 2015-01-02 NOTE — Patient Instructions (Signed)
Glad you are doing well.  I am ordering a sleep study depending on those results we may need an ENT referral  Continue to use your allergy medications daily to help with your symptoms  Follow-up with me in 3 months  Health Maintenance, Female Adopting a healthy lifestyle and getting preventive care can go a long way to promote health and wellness. Talk with your health care provider about what schedule of regular examinations is right for you. This is a good chance for you to check in with your provider about disease prevention and staying healthy. In between checkups, there are plenty of things you can do on your own. Experts have done a lot of research about which lifestyle changes and preventive measures are most likely to keep you healthy. Ask your health care provider for more information. WEIGHT AND DIET  Eat a healthy diet  Be sure to include plenty of vegetables, fruits, low-fat dairy products, and lean protein.  Do not eat a lot of foods high in solid fats, added sugars, or salt.  Get regular exercise. This is one of the most important things you can do for your health.  Most adults should exercise for at least 150 minutes each week. The exercise should increase your heart rate and make you sweat (moderate-intensity exercise).  Most adults should also do strengthening exercises at least twice a week. This is in addition to the moderate-intensity exercise.  Maintain a healthy weight  Body mass index (BMI) is a measurement that can be used to identify possible weight problems. It estimates body fat based on height and weight. Your health care provider can help determine your BMI and help you achieve or maintain a healthy weight.  For females 74 years of age and older:   A BMI below 18.5 is considered underweight.  A BMI of 18.5 to 24.9 is normal.  A BMI of 25 to 29.9 is considered overweight.  A BMI of 30 and above is considered obese.  Watch levels of cholesterol and  blood lipids  You should start having your blood tested for lipids and cholesterol at 39 years of age, then have this test every 5 years.  You may need to have your cholesterol levels checked more often if:  Your lipid or cholesterol levels are high.  You are older than 39 years of age.  You are at high risk for heart disease.  CANCER SCREENING   Lung Cancer  Lung cancer screening is recommended for adults 49-47 years old who are at high risk for lung cancer because of a history of smoking.  A yearly low-dose CT scan of the lungs is recommended for people who:  Currently smoke.  Have quit within the past 15 years.  Have at least a 30-pack-year history of smoking. A pack year is smoking an average of one pack of cigarettes a day for 1 year.  Yearly screening should continue until it has been 15 years since you quit.  Yearly screening should stop if you develop a health problem that would prevent you from having lung cancer treatment.  Breast Cancer  Practice breast self-awareness. This means understanding how your breasts normally appear and feel.  It also means doing regular breast self-exams. Let your health care provider know about any changes, no matter how small.  If you are in your 20s or 30s, you should have a clinical breast exam (CBE) by a health care provider every 1-3 years as part of a regular health  exam.  If you are 40 or older, have a CBE every year. Also consider having a breast X-ray (mammogram) every year.  If you have a family history of breast cancer, talk to your health care provider about genetic screening.  If you are at high risk for breast cancer, talk to your health care provider about having an MRI and a mammogram every year.  Breast cancer gene (BRCA) assessment is recommended for women who have family members with BRCA-related cancers. BRCA-related cancers include:  Breast.  Ovarian.  Tubal.  Peritoneal cancers.  Results of the  assessment will determine the need for genetic counseling and BRCA1 and BRCA2 testing. Cervical Cancer Your health care provider may recommend that you be screened regularly for cancer of the pelvic organs (ovaries, uterus, and vagina). This screening involves a pelvic examination, including checking for microscopic changes to the surface of your cervix (Pap test). You may be encouraged to have this screening done every 3 years, beginning at age 27.  For women ages 47-65, health care providers may recommend pelvic exams and Pap testing every 3 years, or they may recommend the Pap and pelvic exam, combined with testing for human papilloma virus (HPV), every 5 years. Some types of HPV increase your risk of cervical cancer. Testing for HPV may also be done on women of any age with unclear Pap test results.  Other health care providers may not recommend any screening for nonpregnant women who are considered low risk for pelvic cancer and who do not have symptoms. Ask your health care provider if a screening pelvic exam is right for you.  If you have had past treatment for cervical cancer or a condition that could lead to cancer, you need Pap tests and screening for cancer for at least 20 years after your treatment. If Pap tests have been discontinued, your risk factors (such as having a new sexual partner) need to be reassessed to determine if screening should resume. Some women have medical problems that increase the chance of getting cervical cancer. In these cases, your health care provider may recommend more frequent screening and Pap tests. Colorectal Cancer  This type of cancer can be detected and often prevented.  Routine colorectal cancer screening usually begins at 39 years of age and continues through 39 years of age.  Your health care provider may recommend screening at an earlier age if you have risk factors for colon cancer.  Your health care provider may also recommend using home test kits  to check for hidden blood in the stool.  A small camera at the end of a tube can be used to examine your colon directly (sigmoidoscopy or colonoscopy). This is done to check for the earliest forms of colorectal cancer.  Routine screening usually begins at age 10.  Direct examination of the colon should be repeated every 5-10 years through 39 years of age. However, you may need to be screened more often if early forms of precancerous polyps or small growths are found. Skin Cancer  Check your skin from head to toe regularly.  Tell your health care provider about any new moles or changes in moles, especially if there is a change in a mole's shape or color.  Also tell your health care provider if you have a mole that is larger than the size of a pencil eraser.  Always use sunscreen. Apply sunscreen liberally and repeatedly throughout the day.  Protect yourself by wearing long sleeves, pants, a wide-brimmed hat, and sunglasses  whenever you are outside. HEART DISEASE, DIABETES, AND HIGH BLOOD PRESSURE   High blood pressure causes heart disease and increases the risk of stroke. High blood pressure is more likely to develop in:  People who have blood pressure in the high end of the normal range (130-139/85-89 mm Hg).  People who are overweight or obese.  People who are African American.  If you are 29-38 years of age, have your blood pressure checked every 3-5 years. If you are 88 years of age or older, have your blood pressure checked every year. You should have your blood pressure measured twice--once when you are at a hospital or clinic, and once when you are not at a hospital or clinic. Record the average of the two measurements. To check your blood pressure when you are not at a hospital or clinic, you can use:  An automated blood pressure machine at a pharmacy.  A home blood pressure monitor.  If you are between 21 years and 74 years old, ask your health care provider if you should  take aspirin to prevent strokes.  Have regular diabetes screenings. This involves taking a blood sample to check your fasting blood sugar level.  If you are at a normal weight and have a low risk for diabetes, have this test once every three years after 39 years of age.  If you are overweight and have a high risk for diabetes, consider being tested at a younger age or more often. PREVENTING INFECTION  Hepatitis B  If you have a higher risk for hepatitis B, you should be screened for this virus. You are considered at high risk for hepatitis B if:  You were born in a country where hepatitis B is common. Ask your health care provider which countries are considered high risk.  Your parents were born in a high-risk country, and you have not been immunized against hepatitis B (hepatitis B vaccine).  You have HIV or AIDS.  You use needles to inject street drugs.  You live with someone who has hepatitis B.  You have had sex with someone who has hepatitis B.  You get hemodialysis treatment.  You take certain medicines for conditions, including cancer, organ transplantation, and autoimmune conditions. Hepatitis C  Blood testing is recommended for:  Everyone born from [birth year] through 1965.  Anyone with known risk factors for hepatitis C. Sexually transmitted infections (STIs)  You should be screened for sexually transmitted infections (STIs) including gonorrhea and chlamydia if:  You are sexually active and are younger than 39 years of age.  You are older than 39 years of age and your health care provider tells you that you are at risk for this type of infection.  Your sexual activity has changed since you were last screened and you are at an increased risk for chlamydia or gonorrhea. Ask your health care provider if you are at risk.  If you do not have HIV, but are at risk, it may be recommended that you take a prescription medicine daily to prevent HIV infection. This is called  pre-exposure prophylaxis (PrEP). You are considered at risk if:  You are sexually active and do not regularly use condoms or know the HIV status of your partner(s).  You take drugs by injection.  You are sexually active with a partner who has HIV. Talk with your health care provider about whether you are at high risk of being infected with HIV. If you choose to begin PrEP, you should first be  tested for HIV. You should then be tested every 3 months for as long as you are taking PrEP.  PREGNANCY   If you are premenopausal and you may become pregnant, ask your health care provider about preconception counseling.  If you may become pregnant, take 400 to 800 micrograms (mcg) of folic acid every day.  If you want to prevent pregnancy, talk to your health care provider about birth control (contraception). OSTEOPOROSIS AND MENOPAUSE   Osteoporosis is a disease in which the bones lose minerals and strength with aging. This can result in serious bone fractures. Your risk for osteoporosis can be identified using a bone density scan.  If you are 14 years of age or older, or if you are at risk for osteoporosis and fractures, ask your health care provider if you should be screened.  Ask your health care provider whether you should take a calcium or vitamin D supplement to lower your risk for osteoporosis.  Menopause may have certain physical symptoms and risks.  Hormone replacement therapy may reduce some of these symptoms and risks. Talk to your health care provider about whether hormone replacement therapy is right for you.  HOME CARE INSTRUCTIONS   Schedule regular health, dental, and eye exams.  Stay current with your immunizations.   Do not use any tobacco products including cigarettes, chewing tobacco, or electronic cigarettes.  If you are pregnant, do not drink alcohol.  If you are breastfeeding, limit how much and how often you drink alcohol.  Limit alcohol intake to no more than 1  drink per day for nonpregnant women. One drink equals 12 ounces of beer, 5 ounces of wine, or 1 ounces of hard liquor.  Do not use street drugs.  Do not share needles.  Ask your health care provider for help if you need support or information about quitting drugs.  Tell your health care provider if you often feel depressed.  Tell your health care provider if you have ever been abused or do not feel safe at home.   This information is not intended to replace advice given to you by your health care provider. Make sure you discuss any questions you have with your health care provider.   Document Released: 09/15/2010 Document Revised: 03/23/2014 Document Reviewed: 02/01/2013 Elsevier Interactive Patient Education [employer].

## 2015-01-02 NOTE — Progress Notes (Signed)
   THERAPIST PROGRESS NOTE  Session Time: 1.31pm-2.13pm  Participation Level: Active  Behavioral Response: Well GroomedAlertStresssed  Type of Therapy: Individual Therapy  Treatment Goals addressed: Diagnosis: GAD and goal 1.  Interventions: CBT, Strength-based and Other: stress management  Summary: Wendy Duffy is a 39 y.o. female who presents with report of stressful day that hasn't gone as planned. Pt had taken day off from work and scheduled multiple appointments.  Pt had a break in day when planned on relaxing at home but ended up driving cousin around running errands. Pt also reported looked forward to lunch w/ her husband as a date- but he talked and complained whole time re: family stressors. Pt reported that she was able to communicate w/ him disappointment and resolving.  Pt also reported he recently became worried and expressed upset w/ not having a lot of time w/her over the past several weekends.  Pt reported that following apologized how he expressed and they had a good time together just the 2 of them.  Pt acknowledged that they have to communicate wants and be intentional w/ how spend time together.  Pt also acknowledged that she can still focus on recovering her stressful day- w/ some self care and positive interactions this evening. Pt reported that she has been having positive social interactions, engaging w/ friends and socially as a couple.  Pt does report that she has been allowing children to take responsibility for own choices and not dwell on these. Pt does express some concern about daughter dropping a class this semester and talking about changing major.  Pt increased awareness of how can be supportive and encouraging w/out judging or directing for daughter upcoming choices. .   Suicidal/Homicidal: Nowithout intent/plan  Therapist Response: Assessed pt current functioning per pt report. Processed w/ pt recent stressors and how communicating w/ her husband- assertive  and not passive.  Assisted pt in recognizing need to plan and initiate positive interactions.  Explored positive effect of social interactions.  Processed w/ pt worry about her daughter and how can communicate and be supportive to assist daughter in problem solving- not react/interact from fear based thinking. Plan: Return again in 4 weeks.  Diagnosis:  Generalized Anxiety Disorder and MDD\      Jan Fireman, [facility] 01/02/2015

## 2015-01-02 NOTE — Progress Notes (Signed)
Subjective: Chief Complaint  Patient presents with  . Annual Exam  . Snoring    sinus congestion and breathing concerns  . Anxiety    HPI: Wendy Duffy is a 39 y.o. female presents for well woman/preventative visit and annual GYN examination.  Acute Concerns:  #Congestion/cough: Occurs every 3 months Starts treatment with drainage that includes flonase and zyrtec -  doesn't help Coughing worse at night Postnasal drip cough Mucous production Causes snoring when she is congested - some associated apneic episodes and states she is always tired Currently denies any active symtpoms  Exercise: Not reguarly  Sexual History: Sexually active with husband of one year  Birth Control: Tubal ligation for birth control  OB History: [id number]  POA/Living Will: None    Social History   Social History  . Marital Status: Married    Spouse Name: N/A  . Number of Children: N/A  . Years of Education: N/A   Occupational History  . preschool teacher    Social History Main Topics  . Smoking status: Never Smoker   . Smokeless tobacco: Not on file  . Alcohol Use: 1.8 oz/week    2 Cans of beer, 1 Glasses of wine per week     Comment: weekly  . Drug Use: No     Comment: last used THC in November 2015. using a few times a year  . Sexual Activity: Yes    Birth Control/ Protection: None   Other Topics Concern  . Not on file   Social History Narrative  . No narrative on file    Immunization:  Tdap/TD: Up to date  Influenza: Up to date  Cancer Screening:  Pap Smear: Due  12-point ROS reviewed and were negative unless otherwise noted in HPI.  Past Medical, Surgical, Social, and Family History Reviewed & Updated per EMR.   Objective: BP 143/95 mmHg  Pulse 87  Temp(Src) 98.4 F (36.9 C) (Oral)  Ht 5\' 1"  (1.549 m)  Wt 217 lb (98.431 kg)  BMI 41.02 kg/m2  LMP 12/09/2014 (Exact Date)  Physical Exam  Constitutional: She is oriented to person, place, and time and  well-developed, well-nourished, and in no distress.  HENT:  Head: Normocephalic and atraumatic.  Mouth/Throat: Oropharynx is clear and moist.  Tonsils enlarged without exudate or erythema.  Eyes: EOM are normal. Pupils are equal, round, and reactive to light.  Neck: Normal range of motion. Neck supple. No thyromegaly present.  Cardiovascular: Normal rate, regular rhythm, normal heart sounds and intact distal pulses.   Pulmonary/Chest: Effort normal and breath sounds normal.  Abdominal: Soft. Bowel sounds are normal. There is no tenderness.  Musculoskeletal: Normal range of motion. She exhibits no edema.  Lymphadenopathy:    She has no cervical adenopathy.  Neurological: She is alert and oriented to person, place, and time.  Non-focal   Skin: Skin is warm and dry.  Psychiatric: Mood and affect normal.  Genitalia:  Normal introitus for age, no external lesions, no vaginal discharge, mucosa pink and moist, no vaginal or cervical lesions, no vaginal atrophy, no friaility or hemorrhage, normal uterus size and position, no adnexal masses or tenderness  Assessment/Plan: Please see problem based Assessment and Plan  Orders Placed This Encounter  Procedures  . Split night study    Screen for sleep apnea.    Standing Status: Future     Number of Occurrences:      Standing Expiration Date: 01/02/2016    Order Specific Question:  Where should  this test be performed:    Answer:  [facility] - Pulmonary    [name], DO PGY-2, [facility]

## 2015-01-04 LAB — CYTOLOGY - PAP

## 2015-01-10 ENCOUNTER — Encounter: Payer: Self-pay | Admitting: Obstetrics and Gynecology

## 2015-01-11 NOTE — Assessment & Plan Note (Signed)
Preventative health care visit today. Pap smear performed. Up to date on immunizations.

## 2015-01-11 NOTE — Assessment & Plan Note (Addendum)
Continues to endorse snoring with component of sleep apnea and daytime sleepiness. Patient with prior sleep study in 2012 that was unremarkable. Will repeat sleep study if still normal may refer to ENT for possible tonsillectomy/adenoidectomy as patient's tonsils ae enlarged on inspection. Continue weight loss efforts.

## 2015-01-12 ENCOUNTER — Encounter: Payer: Self-pay | Admitting: Obstetrics and Gynecology

## 2015-01-15 ENCOUNTER — Encounter (HOSPITAL_COMMUNITY): Payer: Self-pay | Admitting: Psychiatry

## 2015-01-15 ENCOUNTER — Ambulatory Visit (INDEPENDENT_AMBULATORY_CARE_PROVIDER_SITE_OTHER): Payer: 59 | Admitting: Psychiatry

## 2015-01-15 VITALS — BP 111/60 | HR 98 | Ht 61.0 in | Wt 217.0 lb

## 2015-01-15 DIAGNOSIS — F411 Generalized anxiety disorder: Secondary | ICD-10-CM | POA: Diagnosis not present

## 2015-01-15 DIAGNOSIS — F331 Major depressive disorder, recurrent, moderate: Secondary | ICD-10-CM | POA: Diagnosis not present

## 2015-01-15 MED ORDER — VENLAFAXINE HCL ER 75 MG PO CP24: 75.0000 mg | ORAL_CAPSULE | Freq: Every day | ORAL | Status: DC

## 2015-01-15 MED ORDER — QUETIAPINE FUMARATE 50 MG PO TABS: 50.0000 mg | ORAL_TABLET | Freq: Every day | ORAL | Status: DC
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
## 2015-01-31 ENCOUNTER — Ambulatory Visit (HOSPITAL_COMMUNITY): Payer: Self-pay | Admitting: Psychology

## 2015-02-05 ENCOUNTER — Telehealth: Payer: Self-pay | Admitting: *Deleted

## 2015-02-05 ENCOUNTER — Telehealth: Payer: Self-pay | Admitting: Obstetrics and Gynecology

## 2015-02-05 NOTE — Telephone Encounter (Signed)
Pt advised of pap results from 02/05/15 as Dr. Gerarda Fraction tried calling pt but no answer and pt called back and was advised of results and verbalized understanding and will call back in 03/2015 to schedule repeat pap. Wendy Duffy, CMA.

## 2015-02-05 NOTE — Telephone Encounter (Signed)
Called patient to discuss pap results but no answer; voicemail left to call clinic back. Unsatisfactory pap at last visit but had HR-HPV. Best option would be to repeat pap in 2-4 months. Please make patient aware.

## 2015-02-16 ENCOUNTER — Other Ambulatory Visit: Payer: Self-pay | Admitting: Family Medicine

## 2015-02-20 ENCOUNTER — Other Ambulatory Visit: Payer: Self-pay | Admitting: Obstetrics and Gynecology

## 2015-02-20 NOTE — Telephone Encounter (Signed)
Needs refill losartan/potassium-has been out for several days [facility] on [address] in [place]

## 2015-02-21 ENCOUNTER — Ambulatory Visit (HOSPITAL_COMMUNITY): Payer: Self-pay | Admitting: Psychiatry

## 2015-02-21 MED ORDER — LOSARTAN POTASSIUM 25 MG PO TABS: ORAL_TABLET | ORAL | Status: DC

## 2015-02-21 NOTE — Telephone Encounter (Signed)
Pt called and needs a refill on her Losartan called in. She has not taken this for a week now. Can we get this ASAP. jw

## 2015-03-12 ENCOUNTER — Other Ambulatory Visit: Payer: Self-pay | Admitting: Obstetrics and Gynecology

## 2015-03-12 DIAGNOSIS — G479 Sleep disorder, unspecified: Secondary | ICD-10-CM

## 2015-03-12 NOTE — Telephone Encounter (Signed)
Why does patient need 50mg  tablets? Her Rx is is for 25mg . Did she have a provider give more than this?

## 2015-03-12 NOTE — Telephone Encounter (Signed)
Pt called because she needs a refill on her Losartan called in. She needs this to be 50 mg tablets. jw

## 2015-03-13 NOTE — Telephone Encounter (Signed)
LM for patient to call back.  Please ask her why she needs the 50mg  dose of losartan.  Jazmin Hartsell,CMA

## 2015-03-13 NOTE — Telephone Encounter (Signed)
Spoke with patient and she states that this was discussed during her last visit, having her double her 25mg  tablets to "max her out on bp meds".  She states that she has been taking 50 mg already but is now out of medication.  She is unable to refill early due to the pharmacy not having a script for new instructions.  Will forward to MD to advise on this concern.  Patient is currently of this medication. Also patient was inquiring about her sleep study that she never heard about.  Spoke with Lynnae Sandhoff at [facility] and order needs to be replaced and placed with their location.  Will order this and they will call patient to schedule.  Kachina Niederer,CMA

## 2015-03-19 MED ORDER — LOSARTAN POTASSIUM 50 MG PO TABS: ORAL_TABLET | ORAL | Status: DC

## 2015-03-19 NOTE — Telephone Encounter (Signed)
Patient is aware of this.  Appt made for 04/09/15. Jazmin Hartsell,CMA

## 2015-03-19 NOTE — Telephone Encounter (Signed)
Please have patient schedule an appointment with me. I saw her once last year and BP was not discussed. Wondering if her prior provider discussed this with her. Since her BPs have been running high will give Losartan 50mg  but she needs to be seen by me.

## 2015-03-27 ENCOUNTER — Other Ambulatory Visit: Payer: Self-pay | Admitting: Family Medicine

## 2015-03-27 ENCOUNTER — Other Ambulatory Visit (HOSPITAL_COMMUNITY): Payer: Self-pay | Admitting: Psychiatry

## 2015-03-29 ENCOUNTER — Telehealth (HOSPITAL_COMMUNITY): Payer: Self-pay | Admitting: *Deleted

## 2015-03-29 NOTE — Telephone Encounter (Signed)
Pt called left voicemail message that she needs refill of her anxiety medication but it is not her Seroquel. She is unable to remember the name. Attempted call back, no answer.

## 2015-03-30 ENCOUNTER — Other Ambulatory Visit (HOSPITAL_COMMUNITY): Payer: Self-pay | Admitting: Psychiatry

## 2015-04-02 ENCOUNTER — Telehealth (HOSPITAL_COMMUNITY): Payer: Self-pay

## 2015-04-02 NOTE — Telephone Encounter (Signed)
Telephone message left for patient this nurse received her call of needing a refill of Venlafaxine.  Informed patient she had an order sent to [facility] on 01/15/15 plus 2 refills so she may want to check with them to see if any refills remaining and have them them transferred to requested [place] in [facility].  Requested patient call back if order needed as she returns on 04/18/15.  Agreed to send request to Dr. Doyne Keel.

## 2015-04-09 ENCOUNTER — Encounter: Payer: Self-pay | Admitting: Obstetrics and Gynecology

## 2015-04-09 ENCOUNTER — Ambulatory Visit (INDEPENDENT_AMBULATORY_CARE_PROVIDER_SITE_OTHER): Payer: 59 | Admitting: Obstetrics and Gynecology

## 2015-04-09 ENCOUNTER — Other Ambulatory Visit (HOSPITAL_COMMUNITY)
Admission: RE | Admit: 2015-04-09 | Discharge: 2015-04-09 | Disposition: A | Discharge status: Home / Self Care | Payer: 59 | Source: Ambulatory Visit | Attending: Family Medicine | Admitting: Family Medicine

## 2015-04-09 VITALS — BP 136/98 | HR 91 | Temp 98.7°F | Ht 61.0 in | Wt 224.5 lb

## 2015-04-09 DIAGNOSIS — R8789 Other abnormal findings in specimens from female genital organs: Secondary | ICD-10-CM

## 2015-04-09 DIAGNOSIS — Z01419 Encounter for gynecological examination (general) (routine) without abnormal findings: Secondary | ICD-10-CM | POA: Insufficient documentation

## 2015-04-09 DIAGNOSIS — F331 Major depressive disorder, recurrent, moderate: Secondary | ICD-10-CM

## 2015-04-09 DIAGNOSIS — I10 Essential (primary) hypertension: Secondary | ICD-10-CM | POA: Diagnosis not present

## 2015-04-09 DIAGNOSIS — Z1151 Encounter for screening for human papillomavirus (HPV): Secondary | ICD-10-CM | POA: Insufficient documentation

## 2015-04-09 DIAGNOSIS — B977 Papillomavirus as the cause of diseases classified elsewhere: Secondary | ICD-10-CM | POA: Diagnosis not present

## 2015-04-09 DIAGNOSIS — R87618 Other abnormal cytological findings on specimens from cervix uteri: Secondary | ICD-10-CM

## 2015-04-09 MED ORDER — AMLODIPINE BESYLATE 10 MG PO TABS: 10.0000 mg | ORAL_TABLET | Freq: Every day | ORAL | Status: DC

## 2015-04-09 MED ORDER — HYDROCHLOROTHIAZIDE 25 MG PO TABS: 25.0000 mg | ORAL_TABLET | Freq: Every day | ORAL | Status: DC

## 2015-04-09 NOTE — Assessment & Plan Note (Signed)
Currently stable per patient. She follows with behavioral health and a counselor. No SI/HI endorsed. On Effexor and Seroquel for mental health.

## 2015-04-09 NOTE — Patient Instructions (Addendum)
I am pleased to hear that things are going well for you.  Here are some of the things we discussed today: -Refilled BP medications. Please check BP while on all three medications.  -I will contact you about the results of your pap smear   Please schedule a follow-up appointment for 2 months.   Thanks for allowing me to be a part of your care! Dr. Gerarda Fraction

## 2015-04-09 NOTE — Progress Notes (Signed)
    Subjective: Chief Complaint  Patient presents with  . Blood pressure follow up     HPI: Wendy Duffy is a 40 y.o. presenting to clinic today to discuss the following:  #Hypertension Blood pressure at home: Does not check but goes to a [facility] sometimes to montior Blood pressure today: elevated Taking Meds: yes  Has been out of medications - HCTZ and amlodipine Side effects: None ROS: Denies headache, dizziness, visual changes, nausea, vomiting, chest pain, abdominal pain or shortness of breath.  #Pap smear -needs repeat pap since last specimen inadequate -showed HR-HPV but no transition zone -denies any vaginal bleeding or discharge, pelvic or abdominal pain -gets really bad cramping with menstrual periods  #Mental Health -follows with behavorial health for depression and anxiety -has not seen counselor since November but plans on scheduling for next month to follow-up -takes Seroquel and Effexor -states her symptoms are stable  ROS noted in HPI.  Past Medical, Surgical, Social, and Family History Reviewed & Updated per EMR.   Objective: BP 136/98 mmHg  Pulse 91  Temp(Src) 98.7 F (37.1 C) (Oral)  Ht 5\' 1"  (1.549 m)  Wt 224 lb 8 oz (101.833 kg)  BMI 42.44 kg/m2  LMP 03/17/2015 Vitals and nursing notes reviewed  Physical Exam  Constitutional: She is well-developed, well-nourished, and in no distress.  Eyes: EOM are normal.  Cardiovascular: Normal rate.   Pulmonary/Chest: Effort normal.  Abdominal: Soft. Bowel sounds are normal. There is no tenderness.  Genitourinary: Vagina normal and cervix normal. No vaginal discharge found.  Neurological:  Grossly non-focal  Skin: Skin is warm, dry and intact.  Psychiatric: Mood and affect normal. She expresses no homicidal and no suicidal ideation.   Assessment/Plan: Please see problem based Assessment and Plan   Meds ordered this encounter  Medications  . hydrochlorothiazide (HYDRODIURIL) 25 MG tablet   Sig: Take 1 tablet (25 mg total) by mouth daily.    Dispense:  60 tablet    Refill:  2  . amLODipine (NORVASC) 10 MG tablet    Sig: Take 1 tablet (10 mg total) by mouth daily.    Dispense:  60 tablet    Refill:  [place], DO 04/09/2015, 1:41 PM PGY-2, [place]

## 2015-04-09 NOTE — Assessment & Plan Note (Addendum)
BP elevated today. Patient supposed to be taking 3 medications (HCTZ, Norvasc, and losartan). However, she is out of Norvasc and HCTZ. Refills given. Discussed that patient should check BPs on all three medications to make sure she doesn't get too low. Blood work up to date.

## 2015-04-09 NOTE — Assessment & Plan Note (Signed)
Repeating pap smear today due to prior specimen being inadequate. No transition zone present but had HR-HPV so repeat testing was recommended per guidelines. Patient with history of colposcopy about 13 years ago. Will follow-up results with patient.

## 2015-04-11 ENCOUNTER — Telehealth: Payer: Self-pay | Admitting: Obstetrics and Gynecology

## 2015-04-11 LAB — CYTOLOGY - PAP

## 2015-04-11 NOTE — Telephone Encounter (Signed)
Negative results confirmed for pap smear.

## 2015-04-11 NOTE — Telephone Encounter (Signed)
Pt needs results to recent pap. Wendy Duffy, ASA

## 2015-04-11 NOTE — Telephone Encounter (Signed)
Will forward to MD to confirm negative results. Dabney Schanz,CMA

## 2015-04-12 ENCOUNTER — Telehealth: Payer: Self-pay | Admitting: Obstetrics and Gynecology

## 2015-04-12 NOTE — Telephone Encounter (Signed)
LM for patient to call back. Jazmin Hartsell,CMA  

## 2015-04-12 NOTE — Telephone Encounter (Signed)
See note from 04/11/2015.  LM ok per DPR that pap is negative. Jazmin Hartsell,CMA

## 2015-04-12 NOTE — Telephone Encounter (Signed)
Pt returned phone call.  

## 2015-04-18 ENCOUNTER — Ambulatory Visit (HOSPITAL_COMMUNITY): Payer: Self-pay | Admitting: Psychiatry

## 2015-04-25 ENCOUNTER — Other Ambulatory Visit (HOSPITAL_COMMUNITY): Payer: Self-pay | Admitting: Psychiatry

## 2015-04-25 ENCOUNTER — Telehealth (HOSPITAL_COMMUNITY): Payer: Self-pay

## 2015-04-25 DIAGNOSIS — F411 Generalized anxiety disorder: Secondary | ICD-10-CM

## 2015-04-25 DIAGNOSIS — F331 Major depressive disorder, recurrent, moderate: Secondary | ICD-10-CM

## 2015-04-25 MED ORDER — QUETIAPINE FUMARATE 50 MG PO TABS: 50.0000 mg | ORAL_TABLET | Freq: Every day | ORAL | Status: DC

## 2015-04-25 NOTE — Telephone Encounter (Signed)
Medication refill request - Telephone call with pt to follow up on message she left she was in need of a new Seroquel order as was rescheduled from 04/18/15 due to Dr. Doyne Keel out thati date until 05/23/15. Pt. going out of town today and needs before leaves sent into [facility].

## 2015-04-25 NOTE — Telephone Encounter (Signed)
Yes one month supply is ok

## 2015-04-25 NOTE — Telephone Encounter (Signed)
A new Seroquel 30 day order e-scribed to patient's [facility] in [facility] per authorization from Dr. Doyne Keel and left patient a phone message the one time order had been approved and sent to her [facility].

## 2015-04-28 ENCOUNTER — Other Ambulatory Visit (HOSPITAL_COMMUNITY): Payer: Self-pay | Admitting: Psychiatry

## 2015-05-08 ENCOUNTER — Encounter (HOSPITAL_BASED_OUTPATIENT_CLINIC_OR_DEPARTMENT_OTHER): Payer: Self-pay

## 2015-05-23 ENCOUNTER — Encounter (HOSPITAL_COMMUNITY): Payer: Self-pay | Admitting: Psychiatry

## 2015-05-23 ENCOUNTER — Encounter (HOSPITAL_COMMUNITY): Payer: Self-pay | Admitting: Psychology

## 2015-05-23 ENCOUNTER — Ambulatory Visit (INDEPENDENT_AMBULATORY_CARE_PROVIDER_SITE_OTHER): Payer: 59 | Admitting: Psychiatry

## 2015-05-23 VITALS — BP 138/85 | HR 90 | Ht 61.0 in | Wt 220.2 lb

## 2015-05-23 DIAGNOSIS — F429 Obsessive-compulsive disorder, unspecified: Secondary | ICD-10-CM | POA: Diagnosis not present

## 2015-05-23 DIAGNOSIS — F331 Major depressive disorder, recurrent, moderate: Secondary | ICD-10-CM | POA: Diagnosis not present

## 2015-05-23 DIAGNOSIS — Z79899 Other long term (current) drug therapy: Secondary | ICD-10-CM

## 2015-05-23 DIAGNOSIS — F411 Generalized anxiety disorder: Secondary | ICD-10-CM | POA: Diagnosis not present

## 2015-05-23 LAB — COMPREHENSIVE METABOLIC PANEL
ALK PHOS: 72 U/L (ref 33–115)
ALT: 15 U/L (ref 6–29)
AST: 16 U/L (ref 10–30)
Albumin: 3.9 g/dL (ref 3.6–5.1)
BUN: 13 mg/dL (ref 7–25)
CHLORIDE: 101 mmol/L (ref 98–110)
CO2: 25 mmol/L (ref 20–31)
Calcium: 9.2 mg/dL (ref 8.6–10.2)
Creat: 0.9 mg/dL (ref 0.50–1.10)
GLUCOSE: 100 mg/dL — AB (ref 65–99)
POTASSIUM: 3.7 mmol/L (ref 3.5–5.3)
Sodium: 134 mmol/L — ABNORMAL LOW (ref 135–146)
Total Bilirubin: 0.4 mg/dL (ref 0.2–1.2)
Total Protein: 6.9 g/dL (ref 6.1–8.1)

## 2015-05-23 LAB — LIPID PANEL
CHOL/HDL RATIO: 2.4 ratio (ref <=5.0)
CHOLESTEROL: 141 mg/dL (ref 125–200)
HDL: 59 mg/dL (ref >=46)
LDL Cholesterol: 61 mg/dL (ref <130)
TRIGLYCERIDES: 104 mg/dL (ref <150)
VLDL: 21 mg/dL (ref <30)

## 2015-05-23 LAB — CBC
HEMATOCRIT: 34.7 % — AB (ref 36.0–46.0)
Hemoglobin: 11 g/dL — ABNORMAL LOW (ref 12.0–15.0)
MCH: 22.7 pg — ABNORMAL LOW (ref 26.0–34.0)
MCHC: 31.7 g/dL (ref 30.0–36.0)
MCV: 71.5 fL — ABNORMAL LOW (ref 78.0–100.0)
MPV: 9.6 fL (ref 8.6–12.4)
PLATELETS: 416 10*3/uL — AB (ref 150–400)
RBC: 4.85 MIL/uL (ref 3.87–5.11)
RDW: 16.8 % — AB (ref 11.5–15.5)
WBC: 5 10*3/uL (ref 4.0–10.5)

## 2015-05-23 LAB — TSH: TSH: 0.82 mIU/L

## 2015-05-23 LAB — PROLACTIN: Prolactin: 5.7 ng/mL

## 2015-05-23 MED ORDER — QUETIAPINE FUMARATE 100 MG PO TABS: 100.0000 mg | ORAL_TABLET | Freq: Every day | ORAL | Status: DC

## 2015-05-23 MED ORDER — VENLAFAXINE HCL ER 75 MG PO CP24: 75.0000 mg | ORAL_CAPSULE | Freq: Every day | ORAL | Status: DC

## 2015-05-23 NOTE — Patient Instructions (Signed)
1. Labs 2. Call [phone] for EKG
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
## 2015-05-24 LAB — HEMOGLOBIN A1C
Hgb A1c MFr Bld: 5.4 % (ref <5.7)
MEAN PLASMA GLUCOSE: 108 mg/dL (ref <117)

## 2015-06-06 ENCOUNTER — Ambulatory Visit (HOSPITAL_COMMUNITY)
Admission: RE | Admit: 2015-06-06 | Discharge: 2015-06-06 | Disposition: A | Discharge status: Home / Self Care | Payer: 59 | Source: Ambulatory Visit | Attending: Psychiatry | Admitting: Psychiatry

## 2015-06-06 DIAGNOSIS — Z79899 Other long term (current) drug therapy: Secondary | ICD-10-CM | POA: Diagnosis not present

## 2015-06-06 DIAGNOSIS — R9431 Abnormal electrocardiogram [ECG] [EKG]: Secondary | ICD-10-CM | POA: Diagnosis not present

## 2015-06-06 DIAGNOSIS — Z5181 Encounter for therapeutic drug level monitoring: Secondary | ICD-10-CM | POA: Diagnosis not present

## 2015-06-06 DIAGNOSIS — I517 Cardiomegaly: Secondary | ICD-10-CM | POA: Diagnosis not present

## 2015-06-12 ENCOUNTER — Other Ambulatory Visit: Payer: Self-pay | Admitting: Obstetrics and Gynecology

## 2015-07-18 ENCOUNTER — Encounter (HOSPITAL_COMMUNITY): Payer: Self-pay | Admitting: Psychology

## 2015-07-18 NOTE — Progress Notes (Signed)
JACKELYN VANNEST is a 40 y.o. female patient who is discharged from counseling as last seen on 01/02/15.  Outpatient Therapist Discharge Summary  MEKHIA CASAL    1975-11-21   Admission Date: 09/27/14   Discharge Date:  07/18/15 Reason for Discharge:  Not active w/ counseling Diagnosis:  MDD Comments:  Pt will continue w/ dr. Doyne Keel.  Pt didn't respond to letter sent for f/u.   Jenne Campus, LPC

## 2015-08-13 ENCOUNTER — Telehealth (HOSPITAL_COMMUNITY): Payer: Self-pay

## 2015-08-13 NOTE — Telephone Encounter (Signed)
Patient is calling for a refill on Effexor. Patient will be out next week and does not see you until 6/15 - okay to refill? Please review and advise, thank you.

## 2015-08-22 ENCOUNTER — Ambulatory Visit (HOSPITAL_COMMUNITY): Payer: Self-pay | Admitting: Psychiatry

## 2015-08-29 ENCOUNTER — Ambulatory Visit (INDEPENDENT_AMBULATORY_CARE_PROVIDER_SITE_OTHER): Payer: 59 | Admitting: Psychiatry

## 2015-08-29 ENCOUNTER — Encounter (HOSPITAL_COMMUNITY): Payer: Self-pay | Admitting: Psychiatry

## 2015-08-29 VITALS — BP 149/105 | HR 82 | Ht 61.0 in | Wt 222.8 lb

## 2015-08-29 DIAGNOSIS — F429 Obsessive-compulsive disorder, unspecified: Secondary | ICD-10-CM | POA: Diagnosis not present

## 2015-08-29 DIAGNOSIS — F331 Major depressive disorder, recurrent, moderate: Secondary | ICD-10-CM | POA: Diagnosis not present

## 2015-08-29 DIAGNOSIS — F411 Generalized anxiety disorder: Secondary | ICD-10-CM | POA: Diagnosis not present

## 2015-08-29 MED ORDER — QUETIAPINE FUMARATE 100 MG PO TABS: 100.0000 mg | ORAL_TABLET | Freq: Every day | ORAL | Status: DC

## 2015-08-29 MED ORDER — BUPROPION HCL ER (XL) 150 MG PO TB24: 150.0000 mg | ORAL_TABLET | Freq: Every day | ORAL | Status: DC

## 2015-08-29 MED ORDER — VENLAFAXINE HCL ER 75 MG PO CP24: 75.0000 mg | ORAL_CAPSULE | Freq: Every day | ORAL | Status: DC
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
## 2015-09-20 ENCOUNTER — Encounter: Payer: Self-pay | Admitting: Obstetrics and Gynecology

## 2015-09-20 ENCOUNTER — Ambulatory Visit (INDEPENDENT_AMBULATORY_CARE_PROVIDER_SITE_OTHER): Payer: BLUE CROSS/BLUE SHIELD | Admitting: Obstetrics and Gynecology

## 2015-09-20 ENCOUNTER — Telehealth: Payer: Self-pay | Admitting: *Deleted

## 2015-09-20 VITALS — BP 140/80 | HR 93 | Temp 98.2°F | Ht 61.0 in | Wt 220.0 lb

## 2015-09-20 DIAGNOSIS — Z30013 Encounter for initial prescription of injectable contraceptive: Secondary | ICD-10-CM | POA: Diagnosis not present

## 2015-09-20 DIAGNOSIS — N938 Other specified abnormal uterine and vaginal bleeding: Secondary | ICD-10-CM | POA: Insufficient documentation

## 2015-09-20 DIAGNOSIS — D259 Leiomyoma of uterus, unspecified: Secondary | ICD-10-CM

## 2015-09-20 DIAGNOSIS — I10 Essential (primary) hypertension: Secondary | ICD-10-CM

## 2015-09-20 DIAGNOSIS — G479 Sleep disorder, unspecified: Secondary | ICD-10-CM

## 2015-09-20 DIAGNOSIS — D649 Anemia, unspecified: Secondary | ICD-10-CM | POA: Diagnosis not present

## 2015-09-20 LAB — CBC
HCT: 33.2 % — ABNORMAL LOW (ref 35.0–45.0)
HEMOGLOBIN: 10.2 g/dL — AB (ref 11.7–15.5)
MCH: 20.8 pg — AB (ref 27.0–33.0)
MCHC: 30.7 g/dL — AB (ref 32.0–36.0)
MCV: 67.8 fL — AB (ref 80.0–100.0)
MPV: 9.7 fL (ref 7.5–12.5)
Platelets: 426 10*3/uL — ABNORMAL HIGH (ref 140–400)
RBC: 4.9 MIL/uL (ref 3.80–5.10)
RDW: 17.2 % — ABNORMAL HIGH (ref 11.0–15.0)
WBC: 6.3 10*3/uL (ref 3.8–10.8)

## 2015-09-20 LAB — POCT URINE PREGNANCY: PREG TEST UR: NEGATIVE

## 2015-09-20 MED ORDER — MEDROXYPROGESTERONE ACETATE 150 MG/ML IM SUSP
150.0000 mg | Freq: Once | INTRAMUSCULAR | Status: AC
  Administered 2015-09-20: 150 mg via INTRAMUSCULAR

## 2015-09-20 MED ORDER — LOSARTAN POTASSIUM 100 MG PO TABS: 100.0000 mg | ORAL_TABLET | Freq: Every day | ORAL | Status: DC

## 2015-09-20 NOTE — Telephone Encounter (Signed)
LM for patient to call back.  Please give her number for radiology so she can get this schedule at a time that works best for her.  [phone].  Thanks [employer]

## 2015-09-20 NOTE — Progress Notes (Signed)
Subjective: Chief Complaint  Patient presents with  . Menstrual Problem    every 2 weeks x 6 months     HPI: Wendy Duffy is a 40 y.o. presenting to clinic today to discuss the following:  #Abnormal Bleeding -gets regular menstrual period every 2 weeks -just started bleeding again today and prior to today 2 weeks ago -This has been going on for 6 months -bleeds for 5 days; first 3 heavy-mod then lightens up -during heavier times does have clots the size of golf balls after pulling out tampon -changes tampon every 2 hrs and they are saturated with blood also on pad -new medications include Wellbutrin started 2 weeks ago -psychiatrist told her Seroquel may be cause of abnormal bleeding but started this >78mo ago -s/p BTL -sexually active with husband but no concern for STDs -used to have regular cycles every 21 days prior to this -Family history of uterine or vaginal cancer: unknown -No h/o fibroids -Has pain before periods over ovaries and bad cramping  Symptoms Weight changes: no Headaches: no Dysuria: no Abdomen or pelvic pain: yes Dizziness: no Pica: yes to starch Cold intolerance: yes Fatigue: yes  #Hypertension Continues to have elevated pressures Taking Meds: yes Side effects: none ROS: Denies headache, dizziness, visual changes, nausea, vomiting, chest pain, abdominal pain or shortness of breath.  #Sleep apnea -needs new order for sleep study -change in insurance so should be able to afford now -continues to endorse snoring and daytime fatigue   ROS noted in HPI.  Past Medical, Surgical, Social, and Family History Reviewed & Updated per EMR. Also noted in HPI. Smoking status - Never smoker   Objective: BP 140/80 mmHg  Pulse 93  Temp(Src) 98.2 F (36.8 C) (Oral)  Ht 5\' 1"  (1.549 m)  Wt 220 lb (99.791 kg)  BMI 41.59 kg/m2  LMP 09/20/2015 Vitals and nursing notes reviewed  Physical Exam  Constitutional: She is well-developed,  well-nourished, and in no distress.  Cardiovascular: Normal rate, regular rhythm, normal heart sounds and intact distal pulses.   Pulmonary/Chest: Effort normal and breath sounds normal.  Abdominal: Soft. Bowel sounds are normal. She exhibits no distension and no mass. There is no tenderness.  Genitourinary:  Not examined (patient declined)  Musculoskeletal: Normal range of motion. She exhibits no edema.  Psychiatric: Mood and affect normal.   Upreg- negative  Assessment/Plan: Please see problem based Assessment and Plan    Orders Placed This Encounter  Procedures  . US Transvaginal Non-OB    Standing Status: Future     Number of Occurrences:      Standing Expiration Date: 11/20/2016    Order Specific Question:  Reason for Exam (SYMPTOM  OR DIAGNOSIS REQUIRED)    Answer:  abnormal bleeding    Order Specific Question:  Preferred imaging location?    Answer:  Internal  . US Pelvis Complete    Standing Status: Future     Number of Occurrences:      Standing Expiration Date: 11/20/2016    Order Specific Question:  Reason for Exam (SYMPTOM  OR DIAGNOSIS REQUIRED)    Answer:  abnormal bleeding    Order Specific Question:  Preferred imaging location?    Answer:  Internal  . CBC  . POCT urine pregnancy  . Split night study    Standing Status: Future     Number of Occurrences:      Standing Expiration Date: 09/19/2016    Order Specific Question:  Where should this test  be performed:    Answer:  [facility] ordered this encounter  Medications  . losartan (COZAAR) 100 MG tablet    Sig: Take 1 tablet (100 mg total) by mouth daily.    Dispense:  60 tablet    Refill:  2  . medroxyPROGESTERone (DEPO-PROVERA) injection 150 mg    Sig:      [name], DO 09/20/2015, 11:11 AM PGY-3, [place]

## 2015-09-20 NOTE — Telephone Encounter (Signed)
Patient states she would like to have ultrasound rescheduled for later in the afternoon on 7/13 due to a work conflict.

## 2015-09-20 NOTE — Patient Instructions (Signed)

## 2015-09-22 NOTE — Assessment & Plan Note (Signed)
Concern for anemia in setting of recurrent uterine bleeding. Patient with symptoms of anemia but hemodynamically stable. Collect CBC today.  Has h/o iron deficiency anemia. Will discuss results with patient when available. Warning sings/symtpoms given and when patient should seek help.

## 2015-09-22 NOTE — Assessment & Plan Note (Signed)
S.p BTL. Upreg negative. Will give Depo not for birth control but to help with dysfunctional bleeding. Other forms of contraception discussed and this was best option for patient.

## 2015-09-22 NOTE — Assessment & Plan Note (Signed)
Long-standing chronic condition that is not controlled. BP elevated today. Increase Losartan to 100mg ; continue HCTZ and Norvasc.

## 2015-09-22 NOTE — Assessment & Plan Note (Signed)
Continues to endorse sleep apnea symptom. Needs a new sleep study. Order placed.

## 2015-09-22 NOTE — Assessment & Plan Note (Addendum)
DUB with unknown origin at this time. Upreg was negative. Unable to perform pelvic exam as patient declined and she also declined STD testing (in a monogamous relationship with husband). Will obtain transvaginal and pelvic ultrasounds to look for uterine pathology. Depo given at today's visit to help regulate bleeding with concerns for anemia. Return precautions given. Follow-up in 1-2 weeks to finish work-up. Handout provided.

## 2015-09-24 ENCOUNTER — Encounter: Payer: Self-pay | Admitting: Obstetrics and Gynecology

## 2015-09-24 MED ORDER — FERROUS SULFATE 325 (65 FE) MG PO TABS: 325.0000 mg | ORAL_TABLET | Freq: Every day | ORAL | Status: DC

## 2015-09-26 ENCOUNTER — Ambulatory Visit (HOSPITAL_COMMUNITY)
Admission: RE | Admit: 2015-09-26 | Discharge: 2015-09-26 | Disposition: A | Discharge status: Home / Self Care | Payer: BLUE CROSS/BLUE SHIELD | Source: Ambulatory Visit | Attending: Family Medicine | Admitting: Family Medicine

## 2015-09-26 ENCOUNTER — Ambulatory Visit (HOSPITAL_COMMUNITY): Payer: BLUE CROSS/BLUE SHIELD

## 2015-09-26 DIAGNOSIS — N938 Other specified abnormal uterine and vaginal bleeding: Secondary | ICD-10-CM

## 2015-09-26 DIAGNOSIS — D259 Leiomyoma of uterus, unspecified: Secondary | ICD-10-CM | POA: Insufficient documentation

## 2015-10-01 NOTE — Telephone Encounter (Signed)
[phone] work number.  Patient would like to know the results of her ultrasound.  Please call her at work first. [employer]

## 2015-10-02 NOTE — Telephone Encounter (Signed)
Discussed results with patient. She would like referral to gynecology. Referral order placed.

## 2015-10-11 NOTE — H&P (Signed)
Wendy Duffy is an 40 y.o. female.Pt presents for scheduled TLH/BS, possible open, possible cystoscopy. Pt reports increased frequency and duration of vaginal bleeding over past 6 months - bleeding q 2 weeks. Last episode has persisted for 3 weeks. She was given depo provera to help manage bleedign by her PCP but that has not helped. Pt reports cramps, pelvic pain, weakness and discomfort. Desires definitive treatment. Hx btl. Risks, benefits alternatives reviewed in full and all questions answered. Risk of infection, bleeding and injury to organs reviewed.   Pertinent Gynecological History: Menses: flow is excessive with use of 12-14 pads or tampons on heaviest days Bleeding: dysfunctional uterine bleeding Contraception:BTL; depo provera DES exposure: denies Blood transfusions: none Sexually transmitted diseases: no past history Previous GYN Procedures: btl; c/s x 2  Last mammogram: n/a Last pap: normal Date: 03/16/2014- wnl OB History: G3, P3003   Menstrual History: Menarche age: 37 Patient's last menstrual period was 09/20/2015.    Past Medical History:  Diagnosis Date  . Anxiety   . Depression   . Gastritis   . Hemorrhoids   . Hypertension     Past Surgical History:  Procedure Laterality Date  . [place], 2003  . WISDOM TOOTH EXTRACTION      Family History  Problem Relation Age of Onset  . Alcohol abuse Mother   . Drug abuse Maternal Uncle   . Alcohol abuse Maternal Grandmother   . Drug abuse Maternal Grandmother     Social History:  reports that she has never smoked. She does not have any smokeless tobacco history on file. She reports that she drinks about 1.8 oz of alcohol per week . She reports that she does not use drugs.  Allergies:  Allergies  Allergen Reactions  . Zoloft [Sertraline Hcl]     Made her feel very anxious     No prescriptions prior to admission.    Review of Systems  Constitutional: Positive for malaise/fatigue. Negative  for chills and fever.  HENT: Negative for hearing loss and tinnitus.   Eyes: Negative for blurred vision and double vision.  Respiratory: Negative for cough and shortness of breath.   Cardiovascular: Negative for chest pain, palpitations and leg swelling.  Gastrointestinal: Positive for abdominal pain. Negative for heartburn, nausea and vomiting.  Genitourinary: Negative for dysuria and urgency.  Musculoskeletal: Positive for back pain and myalgias.  Skin: Negative for itching and rash.  Neurological: Positive for tingling and weakness. Negative for dizziness and headaches.  Psychiatric/Behavioral: Negative for depression, hallucinations, substance abuse and suicidal ideas. The patient is nervous/anxious.     Last menstrual period 09/20/2015. Physical Exam  Constitutional: She is oriented to person, place, and time. She appears well-developed and well-nourished.  Eyes: Pupils are equal, round, and reactive to light.  Neck: Normal range of motion.  Cardiovascular: Normal rate.   Respiratory: Effort normal.  GI: Soft. She exhibits no distension. There is tenderness. There is guarding. There is no rebound.  Genitourinary: Vagina normal.  Genitourinary Comments: Fibroid uterus  Musculoskeletal: Normal range of motion. She exhibits edema.  Neurological: She is alert and oriented to person, place, and time.  Skin: Skin is warm and dry.  Psychiatric: She has a normal mood and affect. Her behavior is normal. Judgment and thought content normal.    No results found for this or any previous visit (from the past 24 hour(s)).  No results found.  Assessment/Plan: [id number] with leiomyomatous uterus causing DUB; uncontrolled with hormonal medication desiring definitve treatment  R/B/A reviewed in full and all questions answered Pt agrees to: Total laparoscopic hysterectomy with bilateral salpingectomy ( hx btl) possible open, possible cystoscopy Consent verified  [place] 10/11/2015, 4:07 PM

## 2015-10-11 NOTE — Patient Instructions (Addendum)
Your procedure is scheduled on: Monday, 8/7  Enter through the Main Entrance of [facility] at: 6 AM  Pick up the phone at the desk and dial [phone].  Call this number if you have problems the morning of surgery: [phone].  Remember:  Do NOT eat food or drink clear liquids (including water) after midnight Sunday, 8/6  Take these medicines the morning of surgery with a SIP OF WATER:  Blood pressure medications, wellbutrin, effexor-xr, prilosec and zyrtec if needed  Do NOT wear jewelry (body piercing), metal hair clips/bobby pins, make-up, or nail polish.  Do NOT wear lotions, powders, or perfumes.  You may wear deoderant.  Do NOT shave for 48 hours prior to surgery.  Do NOT bring valuables to the hospital.  Contacts, dentures, or bridgework may not be worn into surgery.   Leave suitcase in car.  After surgery it may be brought to your room.  For patients admitted to the hospital, checkout time is 11:00 AM the day of discharge.

## 2015-10-14 ENCOUNTER — Encounter (HOSPITAL_COMMUNITY)
Admission: RE | Admit: 2015-10-14 | Discharge: 2015-10-14 | Disposition: A | Discharge status: Home / Self Care | Payer: BLUE CROSS/BLUE SHIELD | Source: Ambulatory Visit | Attending: Obstetrics and Gynecology | Admitting: Obstetrics and Gynecology

## 2015-10-14 ENCOUNTER — Encounter (HOSPITAL_COMMUNITY): Payer: Self-pay

## 2015-10-14 DIAGNOSIS — Z01812 Encounter for preprocedural laboratory examination: Secondary | ICD-10-CM | POA: Diagnosis not present

## 2015-10-14 HISTORY — DX: Anemia, unspecified: D64.9

## 2015-10-14 HISTORY — DX: Sleep apnea, unspecified: G47.30

## 2015-10-14 LAB — COMPREHENSIVE METABOLIC PANEL
ALT: 17 U/L (ref 14–54)
AST: 16 U/L (ref 15–41)
Albumin: 3.8 g/dL (ref 3.5–5.0)
Alkaline Phosphatase: 56 U/L (ref 38–126)
Anion gap: 7 (ref 5–15)
BUN: 16 mg/dL (ref 6–20)
CHLORIDE: 104 mmol/L (ref 101–111)
CO2: 25 mmol/L (ref 22–32)
CREATININE: 1.09 mg/dL — AB (ref 0.44–1.00)
Calcium: 8.9 mg/dL (ref 8.9–10.3)
Glucose, Bld: 101 mg/dL — ABNORMAL HIGH (ref 65–99)
POTASSIUM: 3 mmol/L — AB (ref 3.5–5.1)
Sodium: 136 mmol/L (ref 135–145)
Total Bilirubin: 0.6 mg/dL (ref 0.3–1.2)
Total Protein: 7.2 g/dL (ref 6.5–8.1)

## 2015-10-14 LAB — CBC
HCT: 33 % — ABNORMAL LOW (ref 36.0–46.0)
Hemoglobin: 10.3 g/dL — ABNORMAL LOW (ref 12.0–15.0)
MCH: 22 pg — AB (ref 26.0–34.0)
MCHC: 31.2 g/dL (ref 30.0–36.0)
MCV: 70.5 fL — AB (ref 78.0–100.0)
PLATELETS: 379 10*3/uL (ref 150–400)
RBC: 4.68 MIL/uL (ref 3.87–5.11)
RDW: 21 % — AB (ref 11.5–15.5)
WBC: 5.4 10*3/uL (ref 4.0–10.5)

## 2015-10-14 LAB — TYPE AND SCREEN
ABO/RH(D): O POS
ANTIBODY SCREEN: NEGATIVE

## 2015-10-14 LAB — ABO/RH: ABO/RH(D): O POS

## 2015-10-14 NOTE — Pre-Procedure Instructions (Signed)
Dr. Collins [place] made aware of Ms. Lanuza potassium level today, no new orders received at this time.

## 2015-10-20 MED ORDER — DEXTROSE 5 % IV SOLN
2.0000 g | INTRAVENOUS | Status: AC
  Administered 2015-10-21: 2 g via INTRAVENOUS
  Filled 2015-10-20 (×2): qty 2

## 2015-10-20 NOTE — Anesthesia Preprocedure Evaluation (Addendum)
Anesthesia Evaluation  Patient identified by MRN, date of birth, ID band Patient awake    Reviewed: Allergy & Precautions, NPO status , Patient's Chart, lab work & pertinent test results  Airway Mallampati: I       Dental no notable dental hx.    Pulmonary sleep apnea ,  In process of being w/u for OSA   Pulmonary exam normal        Cardiovascular hypertension, Pt. on medications negative cardio ROS Normal cardiovascular exam Rhythm:Regular Rate:Normal     Neuro/Psych  Headaches, PSYCHIATRIC DISORDERS Anxiety Depression    GI/Hepatic Neg liver ROS, GERD  Medicated and Controlled,  Endo/Other  Morbid obesity  Renal/GU negative Renal ROS  negative genitourinary   Musculoskeletal negative musculoskeletal ROS (+)   Abdominal (+) + obese,   Peds  Hematology  (+) anemia ,   Anesthesia Other Findings   Reproductive/Obstetrics Leiomyoma DUB AUB                            Lab Results  Component Value Date   WBC 5.4 10/14/2015   HGB 10.3 (L) 10/14/2015   HCT 33.0 (L) 10/14/2015   MCV 70.5 (L) 10/14/2015   PLT 379 10/14/2015   Lab Results  Component Value Date   CREATININE 1.09 (H) 10/14/2015   BUN 16 10/14/2015   NA 136 10/14/2015   K 3.0 (L) 10/14/2015   CL 104 10/14/2015   CO2 25 10/14/2015    Anesthesia Physical Anesthesia Plan  ASA: III  Anesthesia Plan: General   Post-op Pain Management:    Induction: Intravenous  Airway Management Planned: Oral ETT  Additional Equipment:   Intra-op Plan:   Post-operative Plan: Extubation in OR  Informed Consent: I have reviewed the patients History and Physical, chart, labs and discussed the procedure including the risks, benefits and alternatives for the proposed anesthesia with the patient or authorized representative who has indicated his/her understanding and acceptance.   Dental advisory given  Plan Discussed with:  Anesthesiologist, CRNA and Surgeon  Anesthesia Plan Comments:        Anesthesia Quick Evaluation

## 2015-10-21 ENCOUNTER — Observation Stay (HOSPITAL_COMMUNITY)
Admission: RE | Admit: 2015-10-21 | Discharge: 2015-10-22 | Disposition: A | Discharge status: Home / Self Care | Payer: BLUE CROSS/BLUE SHIELD | Source: Ambulatory Visit | Attending: Obstetrics and Gynecology | Admitting: Obstetrics and Gynecology

## 2015-10-21 ENCOUNTER — Encounter (HOSPITAL_COMMUNITY): Payer: Self-pay | Admitting: *Deleted

## 2015-10-21 ENCOUNTER — Encounter (HOSPITAL_COMMUNITY)
Admission: RE | Disposition: A | Discharge status: Home / Self Care | Payer: Self-pay | Source: Ambulatory Visit | Attending: Obstetrics and Gynecology

## 2015-10-21 ENCOUNTER — Ambulatory Visit (HOSPITAL_COMMUNITY): Payer: BLUE CROSS/BLUE SHIELD | Admitting: Anesthesiology

## 2015-10-21 DIAGNOSIS — R5383 Other fatigue: Secondary | ICD-10-CM | POA: Insufficient documentation

## 2015-10-21 DIAGNOSIS — I10 Essential (primary) hypertension: Secondary | ICD-10-CM | POA: Insufficient documentation

## 2015-10-21 DIAGNOSIS — D259 Leiomyoma of uterus, unspecified: Principal | ICD-10-CM | POA: Insufficient documentation

## 2015-10-21 DIAGNOSIS — Z90710 Acquired absence of both cervix and uterus: Secondary | ICD-10-CM | POA: Insufficient documentation

## 2015-10-21 DIAGNOSIS — R102 Pelvic and perineal pain: Secondary | ICD-10-CM | POA: Diagnosis present

## 2015-10-21 HISTORY — PX: LAPAROSCOPIC HYSTERECTOMY: SHX1926

## 2015-10-21 HISTORY — PX: BILATERAL SALPINGECTOMY: SHX5743

## 2015-10-21 HISTORY — DX: Acquired absence of both cervix and uterus: Z90.710

## 2015-10-21 LAB — PREGNANCY, URINE: PREG TEST UR: NEGATIVE

## 2015-10-21 SURGERY — HYSTERECTOMY, TOTAL, LAPAROSCOPIC
Anesthesia: General | Site: Abdomen

## 2015-10-21 MED ORDER — DIPHENHYDRAMINE HCL 50 MG/ML IJ SOLN
25.0000 mg | Freq: Four times a day (QID) | INTRAMUSCULAR | Status: DC | PRN
  Administered 2015-10-21: 25 mg via INTRAVENOUS
  Filled 2015-10-21: qty 1

## 2015-10-21 MED ORDER — FENTANYL CITRATE (PF) 100 MCG/2ML IJ SOLN
INTRAMUSCULAR | Status: DC | PRN
  Administered 2015-10-21: 25 ug via INTRAVENOUS
  Administered 2015-10-21 (×2): 100 ug via INTRAVENOUS
  Administered 2015-10-21: 50 ug via INTRAVENOUS
  Administered 2015-10-21: 25 ug via INTRAVENOUS
  Administered 2015-10-21: 50 ug via INTRAVENOUS

## 2015-10-21 MED ORDER — SCOPOLAMINE 1 MG/3DAYS TD PT72
1.0000 | MEDICATED_PATCH | Freq: Once | TRANSDERMAL | Status: DC
  Administered 2015-10-21: 1.5 mg via TRANSDERMAL

## 2015-10-21 MED ORDER — ROCURONIUM BROMIDE 100 MG/10ML IV SOLN
INTRAVENOUS | Status: DC | PRN
Start: 2015-10-21 — End: 2015-10-21
  Administered 2015-10-21: 10 mg via INTRAVENOUS
  Administered 2015-10-21: 40 mg via INTRAVENOUS

## 2015-10-21 MED ORDER — PHENYLEPHRINE 40 MCG/ML (10ML) SYRINGE FOR IV PUSH (FOR BLOOD PRESSURE SUPPORT)
PREFILLED_SYRINGE | INTRAVENOUS | Status: AC
  Filled 2015-10-21: qty 10

## 2015-10-21 MED ORDER — LACTATED RINGERS IV SOLN
INTRAVENOUS | Status: DC
  Administered 2015-10-21 – 2015-10-22 (×3): via INTRAVENOUS

## 2015-10-21 MED ORDER — SUGAMMADEX SODIUM 200 MG/2ML IV SOLN
INTRAVENOUS | Status: DC | PRN
  Administered 2015-10-21: 198 mg via INTRAVENOUS

## 2015-10-21 MED ORDER — BUPIVACAINE HCL (PF) 0.25 % IJ SOLN
INTRAMUSCULAR | Status: DC | PRN
  Administered 2015-10-21: 10 mL

## 2015-10-21 MED ORDER — HYDROMORPHONE HCL 1 MG/ML IJ SOLN
INTRAMUSCULAR | Status: AC
  Filled 2015-10-21: qty 1

## 2015-10-21 MED ORDER — FERROUS SULFATE 325 (65 FE) MG PO TABS
325.0000 mg | ORAL_TABLET | Freq: Every day | ORAL | Status: DC
  Administered 2015-10-22: 325 mg via ORAL
  Filled 2015-10-21 (×2): qty 1

## 2015-10-21 MED ORDER — OXYCODONE-ACETAMINOPHEN 5-325 MG PO TABS
1.0000 | ORAL_TABLET | Freq: Four times a day (QID) | ORAL | Status: DC | PRN
  Administered 2015-10-21 – 2015-10-22 (×3): 1 via ORAL
  Filled 2015-10-21 (×4): qty 1

## 2015-10-21 MED ORDER — BUPROPION HCL ER (XL) 150 MG PO TB24
150.0000 mg | ORAL_TABLET | Freq: Every day | ORAL | Status: DC
  Administered 2015-10-22: 150 mg via ORAL
  Filled 2015-10-21 (×2): qty 1

## 2015-10-21 MED ORDER — FENTANYL CITRATE (PF) 100 MCG/2ML IJ SOLN
INTRAMUSCULAR | Status: AC
  Filled 2015-10-21: qty 2

## 2015-10-21 MED ORDER — PANTOPRAZOLE SODIUM 40 MG PO TBEC
40.0000 mg | DELAYED_RELEASE_TABLET | Freq: Every day | ORAL | Status: DC
  Administered 2015-10-22: 40 mg via ORAL
  Filled 2015-10-21: qty 1

## 2015-10-21 MED ORDER — PROPOFOL 10 MG/ML IV BOLUS
INTRAVENOUS | Status: AC
  Filled 2015-10-21: qty 20

## 2015-10-21 MED ORDER — VENLAFAXINE HCL ER 75 MG PO CP24
75.0000 mg | ORAL_CAPSULE | Freq: Every day | ORAL | Status: DC
  Administered 2015-10-22: 75 mg via ORAL
  Filled 2015-10-21 (×2): qty 1

## 2015-10-21 MED ORDER — HYDROMORPHONE HCL 2 MG/ML IJ SOLN: 2.0000 mg | INTRAMUSCULAR | Status: DC | PRN

## 2015-10-21 MED ORDER — LACTATED RINGERS IR SOLN
Status: DC | PRN
  Administered 2015-10-21: 3000 mL

## 2015-10-21 MED ORDER — LIDOCAINE HCL (CARDIAC) 20 MG/ML IV SOLN
INTRAVENOUS | Status: AC
  Filled 2015-10-21: qty 5

## 2015-10-21 MED ORDER — MIDAZOLAM HCL 2 MG/2ML IJ SOLN
INTRAMUSCULAR | Status: AC
  Filled 2015-10-21: qty 2

## 2015-10-21 MED ORDER — METOCLOPRAMIDE HCL 5 MG/ML IJ SOLN: 10.0000 mg | Freq: Once | INTRAMUSCULAR | Status: DC | PRN

## 2015-10-21 MED ORDER — DEXAMETHASONE SODIUM PHOSPHATE 10 MG/ML IJ SOLN
INTRAMUSCULAR | Status: AC
  Filled 2015-10-21: qty 1

## 2015-10-21 MED ORDER — SOD CITRATE-CITRIC ACID 500-334 MG/5ML PO SOLN
ORAL | Status: AC
  Administered 2015-10-21: 30 mL via ORAL
  Filled 2015-10-21: qty 15

## 2015-10-21 MED ORDER — QUETIAPINE FUMARATE 100 MG PO TABS
100.0000 mg | ORAL_TABLET | Freq: Every day | ORAL | Status: DC
  Administered 2015-10-21: 100 mg via ORAL
  Filled 2015-10-21 (×2): qty 1

## 2015-10-21 MED ORDER — DIPHENHYDRAMINE HCL 50 MG/ML IJ SOLN
25.0000 mg | Freq: Once | INTRAMUSCULAR | Status: AC
  Administered 2015-10-21: 25 mg via INTRAVENOUS

## 2015-10-21 MED ORDER — PROPOFOL 10 MG/ML IV BOLUS
INTRAVENOUS | Status: DC | PRN
  Administered 2015-10-21: 170 mg via INTRAVENOUS

## 2015-10-21 MED ORDER — DIPHENHYDRAMINE HCL 50 MG/ML IJ SOLN
INTRAMUSCULAR | Status: AC
  Filled 2015-10-21: qty 1

## 2015-10-21 MED ORDER — SCOPOLAMINE 1 MG/3DAYS TD PT72
MEDICATED_PATCH | TRANSDERMAL | Status: AC
  Administered 2015-10-21: 1.5 mg via TRANSDERMAL
  Filled 2015-10-21: qty 1

## 2015-10-21 MED ORDER — DEXAMETHASONE SODIUM PHOSPHATE 10 MG/ML IJ SOLN
INTRAMUSCULAR | Status: DC | PRN
  Administered 2015-10-21: 10 mg via INTRAVENOUS

## 2015-10-21 MED ORDER — BUPIVACAINE HCL (PF) 0.25 % IJ SOLN
INTRAMUSCULAR | Status: AC
  Filled 2015-10-21: qty 30

## 2015-10-21 MED ORDER — HYDROMORPHONE HCL 1 MG/ML IJ SOLN
1.0000 mg | INTRAMUSCULAR | Status: DC | PRN
  Administered 2015-10-21: 1 mg via INTRAVENOUS
  Filled 2015-10-21: qty 1

## 2015-10-21 MED ORDER — KETOROLAC TROMETHAMINE 30 MG/ML IJ SOLN
INTRAMUSCULAR | Status: DC | PRN
  Administered 2015-10-21: 30 mg via INTRAVENOUS

## 2015-10-21 MED ORDER — LOSARTAN POTASSIUM 50 MG PO TABS
100.0000 mg | ORAL_TABLET | Freq: Every day | ORAL | Status: DC
  Administered 2015-10-22: 100 mg via ORAL
  Filled 2015-10-21 (×2): qty 2

## 2015-10-21 MED ORDER — GLYCOPYRROLATE 0.2 MG/ML IJ SOLN
INTRAMUSCULAR | Status: AC
  Filled 2015-10-21: qty 1

## 2015-10-21 MED ORDER — PHENYLEPHRINE HCL 10 MG/ML IJ SOLN
INTRAMUSCULAR | Status: AC
  Filled 2015-10-21: qty 1

## 2015-10-21 MED ORDER — LACTATED RINGERS IV SOLN
INTRAVENOUS | Status: DC
  Administered 2015-10-21 (×2): via INTRAVENOUS

## 2015-10-21 MED ORDER — IBUPROFEN 800 MG PO TABS
800.0000 mg | ORAL_TABLET | Freq: Four times a day (QID) | ORAL | Status: DC | PRN
  Administered 2015-10-21 – 2015-10-22 (×2): 800 mg via ORAL
  Filled 2015-10-21 (×3): qty 1

## 2015-10-21 MED ORDER — GLYCOPYRROLATE 0.2 MG/ML IJ SOLN
INTRAMUSCULAR | Status: AC
  Filled 2015-10-21: qty 3

## 2015-10-21 MED ORDER — LACTATED RINGERS IV SOLN
INTRAVENOUS | Status: DC
  Administered 2015-10-21 (×3): via INTRAVENOUS

## 2015-10-21 MED ORDER — ONDANSETRON HCL 4 MG/2ML IJ SOLN
INTRAMUSCULAR | Status: AC
  Filled 2015-10-21: qty 2

## 2015-10-21 MED ORDER — ONDANSETRON HCL 4 MG/2ML IJ SOLN
INTRAMUSCULAR | Status: DC | PRN
  Administered 2015-10-21: 4 mg via INTRAVENOUS

## 2015-10-21 MED ORDER — NEOSTIGMINE METHYLSULFATE 10 MG/10ML IV SOLN
INTRAVENOUS | Status: AC
  Filled 2015-10-21: qty 1

## 2015-10-21 MED ORDER — LIDOCAINE HCL (CARDIAC) 20 MG/ML IV SOLN
INTRAVENOUS | Status: DC | PRN
  Administered 2015-10-21: 80 mg via INTRAVENOUS

## 2015-10-21 MED ORDER — FENTANYL CITRATE (PF) 250 MCG/5ML IJ SOLN
INTRAMUSCULAR | Status: AC
  Filled 2015-10-21: qty 5

## 2015-10-21 MED ORDER — MEPERIDINE HCL 25 MG/ML IJ SOLN: 6.2500 mg | INTRAMUSCULAR | Status: DC | PRN

## 2015-10-21 MED ORDER — SOD CITRATE-CITRIC ACID 500-334 MG/5ML PO SOLN
30.0000 mL | ORAL | Status: AC
  Administered 2015-10-21: 30 mL via ORAL

## 2015-10-21 MED ORDER — ROCURONIUM BROMIDE 100 MG/10ML IV SOLN
INTRAVENOUS | Status: AC
  Filled 2015-10-21: qty 1

## 2015-10-21 MED ORDER — HYDROCHLOROTHIAZIDE 25 MG PO TABS
25.0000 mg | ORAL_TABLET | Freq: Every day | ORAL | Status: DC
  Administered 2015-10-22: 25 mg via ORAL
  Filled 2015-10-21: qty 1

## 2015-10-21 MED ORDER — PHENYLEPHRINE HCL 10 MG/ML IJ SOLN
INTRAMUSCULAR | Status: DC | PRN
  Administered 2015-10-21 (×3): 80 ug via INTRAVENOUS
  Administered 2015-10-21: 40 ug via INTRAVENOUS
  Administered 2015-10-21 (×3): 80 ug via INTRAVENOUS

## 2015-10-21 MED ORDER — SUGAMMADEX SODIUM 200 MG/2ML IV SOLN
INTRAVENOUS | Status: AC
  Filled 2015-10-21: qty 2

## 2015-10-21 MED ORDER — MIDAZOLAM HCL 2 MG/2ML IJ SOLN
INTRAMUSCULAR | Status: DC | PRN
  Administered 2015-10-21: 2 mg via INTRAVENOUS

## 2015-10-21 MED ORDER — HYDROMORPHONE HCL 1 MG/ML IJ SOLN
0.2500 mg | INTRAMUSCULAR | Status: DC | PRN
  Administered 2015-10-21 (×4): 0.5 mg via INTRAVENOUS

## 2015-10-21 MED ORDER — AMLODIPINE BESYLATE 10 MG PO TABS
10.0000 mg | ORAL_TABLET | Freq: Every day | ORAL | Status: DC
  Administered 2015-10-22: 10 mg via ORAL
  Filled 2015-10-21 (×2): qty 1

## 2015-10-21 SURGICAL SUPPLY — 68 items
BARRIER ADHS 3X4 INTERCEED (GAUZE/BANDAGES/DRESSINGS) IMPLANT
BRR ADH 4X3 ABS CNTRL BYND (GAUZE/BANDAGES/DRESSINGS)
CABLE HIGH FREQUENCY MONO STRZ (ELECTRODE) IMPLANT
CANISTER SUCT 3000ML (MISCELLANEOUS) ×4 IMPLANT
CLOTH BEACON ORANGE TIMEOUT ST (SAFETY) ×4 IMPLANT
COVER BACK TABLE 60X90IN (DRAPES) ×4 IMPLANT
COVER LIGHT HANDLE  1/PK (MISCELLANEOUS)
COVER LIGHT HANDLE 1/PK (MISCELLANEOUS) ×4 IMPLANT
COVER MAYO STAND STRL (DRAPES) ×4 IMPLANT
DECANTER SPIKE VIAL GLASS SM (MISCELLANEOUS) IMPLANT
DEVICE SUTURE ENDOST 10MM (ENDOMECHANICALS) ×4 IMPLANT
DRAPE WARM FLUID 44X44 (DRAPE) IMPLANT
DURAPREP 26ML APPLICATOR (WOUND CARE) ×4 IMPLANT
FILTER SMOKE EVAC LAPAROSHD (FILTER) ×2 IMPLANT
GAUZE SPONGE 4X4 16PLY XRAY LF (GAUZE/BANDAGES/DRESSINGS) IMPLANT
GLOVE BIO SURGEON STRL SZ 6.5 (GLOVE) ×10 IMPLANT
GLOVE BIOGEL PI IND STRL 7.0 (GLOVE) ×11 IMPLANT
GLOVE BIOGEL PI INDICATOR 7.0 (GLOVE) ×5
GOWN STRL REUS W/TWL LRG LVL3 (GOWN DISPOSABLE) ×12 IMPLANT
LIQUID BAND (GAUZE/BANDAGES/DRESSINGS) ×4 IMPLANT
NEEDLE HYPO 22GX1.5 SAFETY (NEEDLE) IMPLANT
NS IRRIG 1000ML POUR BTL (IV SOLUTION) ×2 IMPLANT
OCCLUDER COLPOPNEUMO (BALLOONS) ×4 IMPLANT
PACK ABDOMINAL GYN (CUSTOM PROCEDURE TRAY) ×2 IMPLANT
PACK LAVH (CUSTOM PROCEDURE TRAY) ×4 IMPLANT
PAD OB MATERNITY 4.3X12.25 (PERSONAL CARE ITEMS) ×4 IMPLANT
PAD TRENDELENBURG POSITION (MISCELLANEOUS) ×4 IMPLANT
PENCIL SMOKE EVAC W/HOLSTER (ELECTROSURGICAL) ×2 IMPLANT
PROTECTOR NERVE ULNAR (MISCELLANEOUS) ×4 IMPLANT
RTRCTR C-SECT PINK 25CM LRG (MISCELLANEOUS) IMPLANT
SCISSORS LAP 5X35 DISP (ENDOMECHANICALS) IMPLANT
SET CYSTO W/LG BORE CLAMP LF (SET/KITS/TRAYS/PACK) IMPLANT
SET IRRIG TUBING LAPAROSCOPIC (IRRIGATION / IRRIGATOR) ×2 IMPLANT
SET TRI-LUMEN FLTR TB AIRSEAL (TUBING) ×2 IMPLANT
SHEARS HARMONIC ACE PLUS 36CM (ENDOMECHANICALS) ×2 IMPLANT
SLEEVE XCEL OPT CAN 5 100 (ENDOMECHANICALS) ×4 IMPLANT
SPONGE LAP 18X18 X RAY DECT (DISPOSABLE) ×4 IMPLANT
STAPLER VISISTAT 35W (STAPLE) IMPLANT
SUT DVC VLOC 180 0 12IN GS21 (SUTURE)
SUT ENDO VLOC 180-0-8IN (SUTURE) ×4 IMPLANT
SUT PDS AB 0 CTX 60 (SUTURE) IMPLANT
SUT VIC AB 0 CT1 18XCR BRD8 (SUTURE) ×5 IMPLANT
SUT VIC AB 0 CT1 27 (SUTURE) ×8
SUT VIC AB 0 CT1 27XBRD ANBCTR (SUTURE) ×6 IMPLANT
SUT VIC AB 0 CT1 36 (SUTURE) IMPLANT
SUT VIC AB 0 CT1 8-18 (SUTURE) ×4
SUT VIC AB 2-0 CTB1 (SUTURE) ×2 IMPLANT
SUT VIC AB 4-0 KS 27 (SUTURE) ×2 IMPLANT
SUT VIC AB 4-0 PS2 27 (SUTURE) ×4 IMPLANT
SUT VICRYL 0 TIES 12 18 (SUTURE) ×2 IMPLANT
SUT VICRYL 0 UR6 27IN ABS (SUTURE) ×4 IMPLANT
SUTURE DVC VLC 180 0 12IN GS21 (SUTURE) ×2 IMPLANT
SYR CONTROL 10ML LL (SYRINGE) IMPLANT
SYR TOOMEY 50ML (SYRINGE) ×2 IMPLANT
SYRINGE 10CC LL (SYRINGE) ×4 IMPLANT
SYSTEM CARTER THOMASON II (TROCAR) ×2 IMPLANT
TIP UTERINE 5.1X6CM LAV DISP (MISCELLANEOUS) IMPLANT
TIP UTERINE 6.7X10CM GRN DISP (MISCELLANEOUS) IMPLANT
TIP UTERINE 6.7X6CM WHT DISP (MISCELLANEOUS) IMPLANT
TIP UTERINE 6.7X8CM BLUE DISP (MISCELLANEOUS) ×2 IMPLANT
TOWEL OR 17X24 6PK STRL BLUE (TOWEL DISPOSABLE) ×8 IMPLANT
TRAY FOLEY CATH SILVER 14FR (SET/KITS/TRAYS/PACK) ×4 IMPLANT
TROCAR OPTI TIP 5M 100M (ENDOMECHANICALS) ×2 IMPLANT
TROCAR PORT AIRSEAL 5X120 (TROCAR) ×2 IMPLANT
TROCAR XCEL NON-BLD 11X100MML (ENDOMECHANICALS) ×2 IMPLANT
TROCAR XCEL NON-BLD 5MMX100MML (ENDOMECHANICALS) ×2 IMPLANT
WARMER LAPAROSCOPE (MISCELLANEOUS) ×4 IMPLANT
WATER STERILE IRR 1000ML POUR (IV SOLUTION) ×4 IMPLANT

## 2015-10-21 NOTE — Op Note (Addendum)
Operative Note    Preoperative Diagnosis: 1. DUB 2. Fibroid uterus 3. anemia   Postoperative Diagnosis: same   Procedure: Total laparoscopic hysterectomy with bilateral salpingectomy   Surgeon: Dr Terri Piedra DO Assists: Dr Willis Modena MD  Anesthesia: general  Fluids: LR EBL: <84ml UOP: 313ml   Findings: Multilobed fibroid uterus, grossly normal tubes and ovaries   Specimen: Uterus, cervix, bilateral fallopian tubes   Procedure Note Pt taken to operating room, placed in dorsal lithotomy position with her arms safely positioned at her sides. General anesthesia was administered and found to be adequate. Pt was prepped and draped in sterile fashion and a timeout performed. A weighted speculum was placed in the posterior fornix and a sim retractor placed anteriorly. Excellent visualization of the cervix was obtained. Uterus was sounded to 10cm so a size 10 koh was assembled with a small cup and placed with retention sutures at 3 and 9 oclock. A foley catheter was also placed in a sterile fashion Legs were then lowered and attention turned to abdomen.  Here a 8mm incision was made at the umbilicus. A 57mm optiview trocar was then placed with the abdomen tented upwards. The laparoscopic camera was used to confirm placement and pneumoperitoneum obtained with CO2 gas to 36mmHg. The patient was placed in trendelenberg and gross survey of pelvis done.The uterus was noted to have multiple fibroids consistent with [place] - largest in fundal area measuring approximately 3-4cm.  A 56mm endoseal port was placed under visualization in right lower quadrant and an 67mm port in left lower quadrant also under visualization to  avoid the epigastric vessels.  The left fallopian tube was then grasped with a blunt grasper, elevated and excised using the harmonic hemostatically. Next the utero-ovarian ligament and the round ligaments were sequentially grasped and excised. The broad ligament was then separated from the  uterus as well with a bladder flap created. The cardinal ligament was then excised next at the utero-cervical junction and the ovarian vessel clamped, cauterized and cut. The same was done on the patients right with similar results.  Next the bladder reflection was dissected off the cervix as well as the posterior leaflet. The vaginal occluder was filled with 60cc of saline and starting anteriorly and working laterally the uterus and cervix were amputated off the superior vagina. The uterus, cervix and fallopian tubes were removed.  Next, the endostitch was passed into the cavity.  The angles of the vaginal cuff were identified and using the endostitch with a 0 vicryl v-lock suture, the cuff was closed in a running fashion with 3-4 back stitches to ensure closure. After irrigation of the pelvis, no bleeding was noted. A tail end of suture was furhter trimmed to be flush with vaginal tissue.  A final look into the abdominal cavity confirmed no abnormalities or bleeding as well. Pneumoperitoneum was reduced and reinsufflation confirmed no bleeding hence patient was flattened. The 11 port site was closed with 0-vicryl sutureon a u- needle.  The remaining  trocars were removed under direct visualization and gas allowed to escape.  Incision sites were closed with 4-0 vicryl suture and dermabond. Counts were noted to be correct. Patient was awakened and taken to recovery room in stable status.  Foley was left in place

## 2015-10-21 NOTE — Anesthesia Postprocedure Evaluation (Signed)
Anesthesia Post Note  Patient: Wendy Duffy  Procedure(s) Performed: Procedure(s) (LRB): HYSTERECTOMY TOTAL LAPAROSCOPIC (N/A) BILATERAL SALPINGECTOMY (Bilateral)  Patient location during evaluation: [facility] Anesthesia Type: General Level of consciousness: awake, awake and alert and oriented Pain management: pain level controlled Vital Signs Assessment: post-procedure vital signs reviewed and stable Respiratory status: spontaneous breathing, nonlabored ventilation and respiratory function stable Cardiovascular status: stable Postop Assessment: adequate PO intake and no signs of nausea or vomiting Anesthetic complications: no     Last Vitals:  Vitals:   10/21/15 1230 10/21/15 1337  BP:  120/69  Pulse: (!) 115 (!) 105  Resp: 16 15  Temp: 36.9 C 36.8 C    Last Pain:  Vitals:   10/21/15 1340  TempSrc:   PainSc: 2    Pain Goal: Patients Stated Pain Goal: (P) 3 (10/21/15 1340)               Fischer Halley

## 2015-10-21 NOTE — Progress Notes (Signed)
Day of Surgery Procedure(s) (LRB): HYSTERECTOMY TOTAL LAPAROSCOPIC (N/A) BILATERAL SALPINGECTOMY (Bilateral)  Subjective: Patient reports tolerating PO.    Objective: I have reviewed patient's vital signs, intake and output and medications.  General: alert, cooperative and no distress Resp: clear to auscultation bilaterally Cardio: regular rate and rhythm GI: soft, non-tender; bowel sounds normal; no masses,  no organomegaly Extremities: extremities normal, atraumatic, no cyanosis or edema  Inc: C/D/I  Assessment: s/p Procedure(s): HYSTERECTOMY TOTAL LAPAROSCOPIC (N/A) BILATERAL SALPINGECTOMY (Bilateral): stable, progressing well and tolerating diet  Plan: Advance diet Encourage ambulation Advance to PO medication Continue close monitoring  LOS: 0 days    Bovard-Stuckert, Ivaan Liddy 10/21/2015, 5:04 PM

## 2015-10-21 NOTE — Plan of Care (Signed)
Problem: Education: Goal: Knowledge of [facility] Education information/materials will improve Outcome: Progressing Pts understanding is progressing on these things. Plan of care has been discussed with pt., as well as general education/information.    Problem: Nutrition: Goal: Adequate nutrition will be maintained Outcome: Progressing Pt just arrived to floor and is trying ice chips and water. I explained the process of an advance as tolerated diet with pt and she understands.

## 2015-10-21 NOTE — Anesthesia Procedure Notes (Signed)
Procedure Name: Intubation Date/Time: 10/21/2015 7:40 AM Performed by: Casimer Lanius A Pre-anesthesia Checklist: Patient identified, Emergency Drugs available, Suction available and Patient being monitored Patient Re-evaluated:Patient Re-evaluated prior to inductionOxygen Delivery Method: Circle system utilized and Simple face mask Preoxygenation: Pre-oxygenation with 100% oxygen Intubation Type: IV induction and Inhalational induction Ventilation: Mask ventilation without difficulty Laryngoscope Size: Mac and 3 Grade View: Grade II Tube type: Oral Tube size: 7.0 mm Number of attempts: 1 (Dr. Royce Macadamia) Airway Equipment and Method: Stylet Placement Confirmation: ETT inserted through vocal cords under direct vision,  positive ETCO2 and breath sounds checked- equal and bilateral Secured at: 20 (right lip) cm Tube secured with: Tape Dental Injury: Teeth and Oropharynx as per pre-operative assessment

## 2015-10-21 NOTE — Transfer of Care (Signed)
Immediate Anesthesia Transfer of Care Note  Patient: Wendy Duffy  Procedure(s) Performed: Procedure(s): HYSTERECTOMY TOTAL LAPAROSCOPIC (N/A) BILATERAL SALPINGECTOMY (Bilateral)  Patient Location: PACU  Anesthesia Type:General  Level of Consciousness: sedated  Airway & Oxygen Therapy: Patient Spontanous Breathing and Patient connected to nasal cannula oxygen  Post-op Assessment: Report given to RN  Post vital signs: Reviewed and stable  Last Vitals:  Vitals:   10/21/15 0611  BP: (!) 129/93  Pulse: 89  Resp: 18  Temp: 37.1 C    Last Pain:  Vitals:   10/21/15 0611  TempSrc: Oral      Patients Stated Pain Goal: 3 ([id number])  Complications: No apparent anesthesia complications

## 2015-10-21 NOTE — Interval H&P Note (Signed)
History and Physical Interval Note:  10/21/2015 7:20 AM  Wendy Duffy  has presented today for surgery, with the diagnosis of AUB  The various methods of treatment have been discussed with the patient and family. After consideration of risks, benefits and other options for treatment, the patient has consented to  Procedure(s): HYSTERECTOMY TOTAL LAPAROSCOPIC (N/A) HYSTERECTOMY ABDOMINAL (N/A) BILATERAL SALPINGECTOMY (Bilateral) CYSTOSCOPY (N/A) as a surgical intervention .  The patient's history has been reviewed, patient examined, no change in status, stable for surgery.  I have reviewed the patient's chart and labs.  Questions were answered to the patient's satisfaction.     [place]

## 2015-10-21 NOTE — Anesthesia Postprocedure Evaluation (Signed)
Anesthesia Post Note  Patient: Wendy Duffy  Procedure(s) Performed: Procedure(s) (LRB): HYSTERECTOMY TOTAL LAPAROSCOPIC (N/A) BILATERAL SALPINGECTOMY (Bilateral)  Patient location during evaluation: PACU Anesthesia Type: General Level of consciousness: awake and alert and oriented Pain management: pain level controlled Vital Signs Assessment: post-procedure vital signs reviewed and stable Respiratory status: spontaneous breathing, nonlabored ventilation and respiratory function stable Cardiovascular status: blood pressure returned to baseline and stable Postop Assessment: no signs of nausea or vomiting Anesthetic complications: no     Last Vitals:  Vitals:   10/21/15 1200 10/21/15 1215  BP: 105/66 114/69  Pulse: (!) 112 (!) 117  Resp: 12 16  Temp:  36.9 C    Last Pain:  Vitals:   10/21/15 1215  TempSrc:   PainSc: 1    Pain Goal: Patients Stated Pain Goal: 3 (10/21/15 1215)               [name]

## 2015-10-22 DIAGNOSIS — D259 Leiomyoma of uterus, unspecified: Secondary | ICD-10-CM | POA: Diagnosis not present

## 2015-10-22 LAB — CBC WITH DIFFERENTIAL/PLATELET
BASOS PCT: 0 %
Basophils Absolute: 0 10*3/uL (ref 0.0–0.1)
EOS PCT: 0 %
Eosinophils Absolute: 0 10*3/uL (ref 0.0–0.7)
HCT: 28.8 % — ABNORMAL LOW (ref 36.0–46.0)
HEMOGLOBIN: 8.9 g/dL — AB (ref 12.0–15.0)
Lymphocytes Relative: 19 %
Lymphs Abs: 2 10*3/uL (ref 0.7–4.0)
MCH: 22.4 pg — AB (ref 26.0–34.0)
MCHC: 30.9 g/dL (ref 30.0–36.0)
MCV: 72.4 fL — AB (ref 78.0–100.0)
MONO ABS: 0.6 10*3/uL (ref 0.1–1.0)
Monocytes Relative: 6 %
NEUTROS PCT: 75 %
Neutro Abs: 7.8 10*3/uL — ABNORMAL HIGH (ref 1.7–7.7)
PLATELETS: 335 10*3/uL (ref 150–400)
RBC: 3.98 MIL/uL (ref 3.87–5.11)
RDW: 21.6 % — ABNORMAL HIGH (ref 11.5–15.5)
WBC: 10.4 10*3/uL (ref 4.0–10.5)

## 2015-10-22 MED ORDER — OXYCODONE-ACETAMINOPHEN 5-325 MG PO TABS: 1.0000 | ORAL_TABLET | ORAL | 0 refills | Status: DC | PRN

## 2015-10-22 MED ORDER — IBUPROFEN 800 MG PO TABS: 800.0000 mg | ORAL_TABLET | Freq: Four times a day (QID) | ORAL | 1 refills | Status: DC | PRN

## 2015-10-22 NOTE — Progress Notes (Signed)
Pt is discharged in the care of [place].[facility]. [facility]. Denies pain or discomfort. Abdominal dressing is clean  And dry. Understands all  Discharged instructions well. Stable.

## 2015-10-22 NOTE — Discharge Summary (Signed)
Physician Discharge Summary  Patient ID: Wendy Duffy MRN: [id number] DOB/AGE: Nov 30, 1975 40 y.o.  Admit date: 10/21/2015 Discharge date: 10/22/2015  Admission Diagnoses:  Discharge Diagnoses:  Active Problems:   Fibroid uterus   Status post laparoscopic hysterectomy   S/P laparoscopic hysterectomy   Discharged Condition: stable  Hospital Course: Admitted for TLH/BS due to fibroid uterus and DUB and anemia. Pt recovered well on day one post op. Discharged to home  Consults: None  Significant Diagnostic Studies: labs: cbc  Treatments: surgery: TLH/BS  Discharge Exam: Blood pressure 104/63, pulse 88, temperature 98.4 F (36.9 C), temperature source Oral, resp. rate 18, height 5\' 1"  (1.549 m), weight 218 lb 4 oz (99 kg), last menstrual period 09/20/2015, SpO2 98 %. General appearance: alert, cooperative and no distress GI: normal findings: incision c/d/i Neurologic: Alert and oriented X 3, normal strength and tone. Normal symmetric reflexes. Normal coordination and gait  Disposition: 01-Home or Self Care  Discharge Instructions    Call MD for:  persistant dizziness or light-headedness    Complete by:  As directed   Call MD for:  persistant nausea and vomiting    Complete by:  As directed   Call MD for:  redness, tenderness, or signs of infection (pain, swelling, redness, odor or green/yellow discharge around incision site)    Complete by:  As directed   Call MD for:  severe uncontrolled pain    Complete by:  As directed   Call MD for:  temperature >100.4    Complete by:  As directed   Diet - low sodium heart healthy    Complete by:  As directed   Discharge instructions    Complete by:  As directed   Nothing in vagina for 6 weeks   Driving Restrictions    Complete by:  As directed   None while taking narcotic pain medication   Increase activity slowly    Complete by:  As directed   Lifting restrictions    Complete by:  As directed   Nothing >10-15lbs   Sexual Activity  Restrictions    Complete by:  As directed   None for 6 weeks       Medication List    TAKE these medications   ALEVE PO Take 1 tablet by mouth daily.   amLODipine 10 MG tablet Commonly known as:  NORVASC Take 1 tablet (10 mg total) by mouth daily.   buPROPion 150 MG 24 hr tablet Commonly known as:  WELLBUTRIN XL Take 1 tablet (150 mg total) by mouth daily.   cetirizine 10 MG tablet Commonly known as:  ZYRTEC Take 1 tablet (10 mg total) by mouth daily.   ferrous sulfate 325 (65 FE) MG tablet Take 1 tablet (325 mg total) by mouth daily with breakfast.   hydrochlorothiazide 25 MG tablet Commonly known as:  HYDRODIURIL Take 1 tablet (25 mg total) by mouth daily.   ibuprofen 800 MG tablet Commonly known as:  ADVIL,MOTRIN Take 1 tablet (800 mg total) by mouth every 6 (six) hours as needed for mild pain, moderate pain or cramping.   losartan 100 MG tablet Commonly known as:  COZAAR Take 1 tablet (100 mg total) by mouth daily.   omeprazole 40 MG capsule Commonly known as:  PRILOSEC take 1 capsule by mouth once daily   oxyCODONE-acetaminophen 5-325 MG tablet Commonly known as:  PERCOCET/ROXICET Take 1 tablet by mouth every 4 (four) hours as needed for severe pain.   QUEtiapine 100 MG tablet Commonly known  as:  SEROQUEL Take 1 tablet (100 mg total) by mouth at bedtime.   venlafaxine XR 75 MG 24 hr capsule Commonly known as:  EFFEXOR-XR Take 1 capsule (75 mg total) by mouth daily.      Follow-up Information    [place], DO. Schedule an appointment as soon as possible for a visit in 2 week(s).   Specialty:  Obstetrics and Gynecology Why:  For incision check Contact information: [place] [address] [phone]           Signed: Sherlyn Hay 10/22/2015, 1:10 PM

## 2015-10-22 NOTE — Progress Notes (Signed)
Patient ID: Wendy Duffy, female   DOB: April 12, 1975, 40 y.o.   MRN: [id number] Pt doing well. Pain controlled with percocet. No fever or chills. Scant vaginal bleeding only. Has ambulated around hallways with no issues. Passing gas. Toerating diet. Desires discharge to home VSS ABD- mild distension as expected; soft, incisions c/d/i EXT - no homans  CBC - pending   A/P: POD #1 s/p TLH/BS - recovering well        Reviewed discharge to home instructions; pending void without foley and cbc        F/u in 2weeks for incision check.

## 2015-10-22 NOTE — Discharge Instructions (Signed)
Nothing in vagina for 6 weeks.  No sex, tampons, and douching.  Other instructions as in [facility] Discharge Booklet. °

## 2015-10-23 ENCOUNTER — Encounter (HOSPITAL_COMMUNITY): Payer: Self-pay | Admitting: Obstetrics and Gynecology

## 2015-10-23 NOTE — Addendum Note (Signed)
Addendum  created 10/23/15 1323 by Laverle Hobby, CRNA   Charge Capture section accepted

## 2015-11-04 ENCOUNTER — Other Ambulatory Visit: Payer: Self-pay | Admitting: Obstetrics and Gynecology

## 2015-11-04 MED ORDER — AMLODIPINE BESYLATE 10 MG PO TABS
10.0000 mg | ORAL_TABLET | Freq: Every day | ORAL | 2 refills | Status: DC
Start: 2015-11-04 — End: 2016-03-30

## 2015-11-04 MED ORDER — HYDROCHLOROTHIAZIDE 25 MG PO TABS: 25.0000 mg | ORAL_TABLET | Freq: Every day | ORAL | 2 refills | Status: DC

## 2015-11-04 NOTE — Telephone Encounter (Signed)
Needs refill on bp medication  HCTZ and amlodipine. Wendy Duffy on [employer]

## 2015-12-05 ENCOUNTER — Ambulatory Visit (INDEPENDENT_AMBULATORY_CARE_PROVIDER_SITE_OTHER): Payer: Self-pay | Admitting: Psychiatry

## 2015-12-05 ENCOUNTER — Encounter (HOSPITAL_COMMUNITY): Payer: Self-pay | Admitting: Psychiatry

## 2015-12-05 DIAGNOSIS — F429 Obsessive-compulsive disorder, unspecified: Secondary | ICD-10-CM

## 2015-12-05 DIAGNOSIS — F411 Generalized anxiety disorder: Secondary | ICD-10-CM

## 2015-12-05 DIAGNOSIS — F331 Major depressive disorder, recurrent, moderate: Secondary | ICD-10-CM

## 2015-12-05 MED ORDER — BUPROPION HCL ER (XL) 150 MG PO TB24
150.0000 mg | ORAL_TABLET | Freq: Every day | ORAL | 1 refills | Status: DC
Start: 2015-12-05 — End: 2016-03-19

## 2015-12-05 MED ORDER — VENLAFAXINE HCL ER 75 MG PO CP24: 75.0000 mg | ORAL_CAPSULE | Freq: Every day | ORAL | 1 refills | Status: DC

## 2015-12-05 MED ORDER — QUETIAPINE FUMARATE 100 MG PO TABS: 100.0000 mg | ORAL_TABLET | Freq: Every day | ORAL | 1 refills | Status: DC
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
## 2015-12-15 ENCOUNTER — Ambulatory Visit (HOSPITAL_BASED_OUTPATIENT_CLINIC_OR_DEPARTMENT_OTHER): Payer: BLUE CROSS/BLUE SHIELD | Attending: Family Medicine | Admitting: Internal Medicine

## 2015-12-15 VITALS — Ht 61.0 in | Wt 221.0 lb

## 2015-12-15 DIAGNOSIS — G4761 Periodic limb movement disorder: Secondary | ICD-10-CM | POA: Diagnosis not present

## 2015-12-15 DIAGNOSIS — G4720 Circadian rhythm sleep disorder, unspecified type: Secondary | ICD-10-CM | POA: Diagnosis not present

## 2015-12-15 DIAGNOSIS — G479 Sleep disorder, unspecified: Secondary | ICD-10-CM

## 2015-12-15 DIAGNOSIS — R0683 Snoring: Secondary | ICD-10-CM | POA: Insufficient documentation

## 2015-12-28 NOTE — Procedures (Signed)
   Patient Name: Wendy Duffy, Wendy Duffy Date: 12/15/2015 Gender: Female D.O.B: 1975/12/11 Age (years): 69 Referring Provider: Andrena Mews Height (inches): 61 Interpreting Physician: Baird Lyons MD, ABSM Weight (lbs): 221 RPSGT: Gerhard Perches BMI: 42 MRN: [id number] Neck Size: 15.25 CLINICAL INFORMATION Sleep Study Type: NPSG Indication for sleep study: Snoring Epworth Sleepiness Score: 1  SLEEP STUDY TECHNIQUE As per the AASM Manual for the Scoring of Sleep and Associated Events v2.3 (April 2016) with a hypopnea requiring 4% desaturations. The channels recorded and monitored were frontal, central and occipital EEG, electrooculogram (EOG), submentalis EMG (chin), nasal and oral airflow, thoracic and abdominal wall motion, anterior tibialis EMG, snore microphone, electrocardiogram, and pulse oximetry.  MEDICATIONS Medications self-administered by patient taken the night of the study : [place] The study was initiated at 9:52:54 PM and ended at 4:38:28 AM. Sleep onset time was 6.0 minutes and the sleep efficiency was 87.3%. The total sleep time was 354.1 minutes. Stage REM latency was 227.0 minutes. The patient spent 27.42% of the night in stage N1 sleep, 58.32% in stage N2 sleep, 0.00% in stage N3 and 14.26% in REM. Alpha intrusion was absent. Supine sleep was 47.47%.  RESPIRATORY PARAMETERS The overall apnea/hypopnea index (AHI) was 0.5 per hour. There were 1 total apneas, including 1 obstructive, 0 central and 0 mixed apneas. There were 2 hypopneas and 6 RERAs. The AHI during Stage REM sleep was 3.6 per hour. AHI while supine was 1.1 per hour. The mean oxygen saturation was 94.80%. The minimum SpO2 during sleep was 83.00%. Loud snoring was noted during this study.  CARDIAC DATA The 2 lead EKG demonstrated sinus rhythm. The mean heart rate was 96.95 beats per minute. Other EKG findings include: None.  LEG MOVEMENT DATA The total PLMS were 301  with a resulting PLMS index of 51.01. Associated arousal with leg movement index was 1.2 .  IMPRESSIONS - No significant obstructive sleep apnea occurred during this study (AHI = 0.5/h). - No significant central sleep apnea occurred during this study (CAI = 0.0/h). - Mild oxygen desaturation was noted during this study (Min O2 = 83.00%). - The patient snored with Loud snoring volume. - No cardiac abnormalities were noted during this study. - Severe periodic limb movements of sleep occurred during the study. No significant associated arousals.  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 - Sleep hygiene should be reviewed to assess factors that may improve sleep quality. - Weight management and regular exercise should be initiated or continued if appropriate. - Consider specific therapy for Periodic Limb Movement sleep disorder  [Electronically signed] 12/28/2015 01:01 PM  Baird Lyons MD, [place], American Board of Sleep Medicine   NPI: [id number]  [place], [place] of Sleep Medicine  ELECTRONICALLY SIGNED ON:  12/28/2015, 1:02 PM [place] PH: [phone]   FX: [phone] Br[place]

## 2016-01-07 ENCOUNTER — Telehealth: Payer: Self-pay | Admitting: Obstetrics and Gynecology

## 2016-01-07 NOTE — Telephone Encounter (Signed)
Would like results from sleep study done in early oct.

## 2016-01-08 ENCOUNTER — Encounter: Payer: Self-pay | Admitting: Obstetrics and Gynecology

## 2016-01-08 NOTE — Telephone Encounter (Signed)
Letter sent to patient with results.

## 2016-01-20 ENCOUNTER — Encounter (HOSPITAL_COMMUNITY): Payer: Self-pay | Admitting: Emergency Medicine

## 2016-01-20 ENCOUNTER — Emergency Department (HOSPITAL_COMMUNITY)
Admission: EM | Admit: 2016-01-20 | Discharge: 2016-01-20 | Disposition: A | Discharge status: Home / Self Care | Payer: BLUE CROSS/BLUE SHIELD | Attending: Emergency Medicine | Admitting: Emergency Medicine

## 2016-01-20 ENCOUNTER — Emergency Department (HOSPITAL_COMMUNITY): Payer: BLUE CROSS/BLUE SHIELD

## 2016-01-20 DIAGNOSIS — I10 Essential (primary) hypertension: Secondary | ICD-10-CM | POA: Insufficient documentation

## 2016-01-20 DIAGNOSIS — R05 Cough: Secondary | ICD-10-CM | POA: Diagnosis present

## 2016-01-20 DIAGNOSIS — J189 Pneumonia, unspecified organism: Secondary | ICD-10-CM | POA: Insufficient documentation

## 2016-01-20 LAB — BASIC METABOLIC PANEL
Anion gap: 4 — ABNORMAL LOW (ref 5–15)
BUN: 11 mg/dL (ref 6–20)
CALCIUM: 8.3 mg/dL — AB (ref 8.9–10.3)
CHLORIDE: 108 mmol/L (ref 101–111)
CO2: 24 mmol/L (ref 22–32)
CREATININE: 0.85 mg/dL (ref 0.44–1.00)
GFR calc non Af Amer: >60 mL/min (ref >60)
GLUCOSE: 100 mg/dL — AB (ref 65–99)
Potassium: 3.3 mmol/L — ABNORMAL LOW (ref 3.5–5.1)
Sodium: 136 mmol/L (ref 135–145)

## 2016-01-20 LAB — CBC
HCT: 34.8 % — ABNORMAL LOW (ref 36.0–46.0)
HEMOGLOBIN: 10.9 g/dL — AB (ref 12.0–15.0)
MCH: 24.5 pg — AB (ref 26.0–34.0)
MCHC: 31.3 g/dL (ref 30.0–36.0)
MCV: 78.4 fL (ref 78.0–100.0)
PLATELETS: 332 10*3/uL (ref 150–400)
RBC: 4.44 MIL/uL (ref 3.87–5.11)
RDW: 17.1 % — ABNORMAL HIGH (ref 11.5–15.5)
WBC: 4.1 10*3/uL (ref 4.0–10.5)

## 2016-01-20 LAB — I-STAT TROPONIN, ED: Troponin i, poc: 0.01 ng/mL (ref 0.00–0.08)

## 2016-01-20 MED ORDER — AZITHROMYCIN 250 MG PO TABS: 250.0000 mg | ORAL_TABLET | Freq: Every day | ORAL | 0 refills | Status: DC

## 2016-01-20 NOTE — ED Triage Notes (Signed)
Pt c/o sudden onset chest pain upon waking this morning. Pt sts pain is aching 5/10 constantly with sharp shooting pains intermittently. Pain worse when coughing and deep breathing. Pt denies radiation of pain. Pt denies N/V. C/o Mild SOB. A&Ox4 and ambulatory. Pt has hx of enlarged L ventricle.

## 2016-01-20 NOTE — ED Notes (Signed)
Patient is alert and oriented x3.  She was given DC instructions and follow up visit instructions.  Patient gave verbal understanding. She was DC ambulatory under her own power to home.  V/S stable.  He was not showing any signs of distress on DC 

## 2016-01-20 NOTE — Discharge Instructions (Signed)
Zyrtec for allergy symptoms. Zithromax as prescribed for infection, take until all gone. Drink plenty of fluids. Rest. Follow up with primary care doctor for recheck in 2-3 days.

## 2016-01-20 NOTE — ED Provider Notes (Signed)
[facility] DEPT Provider Note   CSN: [id number] Arrival date & time: 01/20/16  0856     History   Chief Complaint Chief Complaint  Patient presents with  . Chest Pain  . Cough    HPI Wendy Duffy is a 40 y.o. female.  HPI Wendy Duffy is a 40 y.o. female with history of anemia, depression, anxiety, hypertension, presents to emergency department with complaint of left-sided chest pain and cough. Patient states that she woke up this morning with left-sided chest pain that is worsened with walking and palpation of the left chest. She states pain is constant, dull, but states once in a while she gets "sharp shooting jolts." Pain does not radiate. She reports associated cough for about a month. She states "it's just my allergies." She is not taking anything for her allergies. She reports nasal congestion and postnasal drip which she also attributes to allergies. She denies any fever or chills. She denies any shortness of breath. She denies any pain worsening with breathing or coughing. She denies any pain or swelling in her legs. No recent travel, she did have hysterectomy in August, she is not on any exogenous estrogens. She does not smoke. She states that she has not had any prior cardiac issues, she does have history of heart issues and her maternal grandmother. She has not taken any medications for her chest pain prior to coming in.  Past Medical History:  Diagnosis Date  . Anemia   . Anxiety   . Depression   . Gastritis   . Hemorrhoids   . Hypertension   . Sleep apnea    questionable refered for sleep study will schedule appointment     Patient Active Problem List   Diagnosis Date Noted  . Fibroid uterus 10/21/2015  . Status post laparoscopic hysterectomy 10/21/2015  . S/P laparoscopic hysterectomy 10/21/2015  . DUB (dysfunctional uterine bleeding) 09/20/2015  . Anemia 09/20/2015  . Pap smear abnormality of cervix/human papillomavirus (HPV) positive 04/09/2015  .  MDD (major depressive disorder), recurrent episode, moderate ([place]) 01/15/2015  . GAD (generalized anxiety disorder) 01/15/2015  . Migraine 01/31/2014  . Birth control 06/14/2012  . Depression 04/06/2012  . Preventative health care 12/12/2010  . Anxiety 05/21/2010  . Disturbance in sleep behavior 10/01/2009  . VENTRICULAR HYPERTROPHY, LEFT 06/15/2007  . HYPERTENSION, BENIGN 05/26/2006  . OBESITY, NOS 05/13/2006    Past Surgical History:  Procedure Laterality Date  . BILATERAL SALPINGECTOMY Bilateral 10/21/2015   Procedure: BILATERAL SALPINGECTOMY;  Surgeon: Sherlyn Hay, DO;  Location: [facility];  Service: Gynecology;  Laterality: Bilateral;  . [place], 2003  . LAPAROSCOPIC HYSTERECTOMY N/A 10/21/2015   Procedure: HYSTERECTOMY TOTAL LAPAROSCOPIC;  Surgeon: Sherlyn Hay, DO;  Location: [facility];  Service: Gynecology;  Laterality: N/A;  . WISDOM TOOTH EXTRACTION      OB History    Gravida Para Term Preterm AB Living   6 3 3  0 3 3   SAB TAB Ectopic Multiple Live Births                   Home Medications    Prior to Admission medications   Medication Sig Start Date End Date Taking? Authorizing Provider  amLODipine (NORVASC) 10 MG tablet Take 1 tablet (10 mg total) by mouth daily. 11/04/15  Yes Katheren Shams, DO  buPROPion (WELLBUTRIN XL) 150 MG 24 hr tablet Take 1 tablet (150 mg total) by mouth daily. 12/05/15  Yes [name], MD  cetirizine (ZYRTEC) 10 MG tablet Take 1 tablet (10 mg total) by mouth daily. Patient taking differently: Take 10 mg by mouth daily as needed for allergies.  10/24/14  Yes Rosemarie Ax, MD  hydrochlorothiazide (HYDRODIURIL) 25 MG tablet Take 1 tablet (25 mg total) by mouth daily. 11/04/15  Yes Katheren Shams, DO  ibuprofen (ADVIL,MOTRIN) 800 MG tablet Take 1 tablet (800 mg total) by mouth every 6 (six) hours as needed for mild pain, moderate pain or cramping. 10/22/15  Yes Cecilia Worema Banga, DO  losartan (COZAAR) 100 MG tablet  Take 1 tablet (100 mg total) by mouth daily. 09/20/15  Yes Katheren Shams, DO  omeprazole (PRILOSEC) 40 MG capsule take 1 capsule by mouth once daily 07/28/13  Yes Amber Fidel Levy, MD  oxyCODONE-acetaminophen (PERCOCET/ROXICET) 5-325 MG tablet Take 1 tablet by mouth every 4 (four) hours as needed for severe pain. 10/22/15  Yes Cecilia Worema Banga, DO  QUEtiapine (SEROQUEL) 100 MG tablet Take 1 tablet (100 mg total) by mouth at bedtime. 12/05/15 12/04/16 Yes [name], MD  venlafaxine XR (EFFEXOR-XR) 75 MG 24 hr capsule Take 1 capsule (75 mg total) by mouth daily. 12/05/15  Yes [name], MD  ferrous sulfate 325 (65 FE) MG tablet Take 1 tablet (325 mg total) by mouth daily with breakfast. Patient not taking: Reported on 01/20/2016 09/24/15   Katheren Shams, DO    Family History Family History  Problem Relation Age of Onset  . Alcohol abuse Mother   . Drug abuse Maternal Uncle   . Alcohol abuse Maternal Grandmother   . Drug abuse Maternal Grandmother     Social History Social History  Substance Use Topics  . Smoking status: Never Smoker  . Smokeless tobacco: Never Used  . Alcohol use 1.8 oz/week    1 Glasses of wine, 2 Cans of beer per week     Comment: weekly     Allergies   Zoloft [sertraline hcl]   Review of Systems Review of Systems  Constitutional: Negative for chills and fever.  Respiratory: Positive for cough and chest tightness. Negative for shortness of breath.   Cardiovascular: Positive for chest pain. Negative for palpitations and leg swelling.  Gastrointestinal: Negative for abdominal pain, diarrhea, nausea and vomiting.  Genitourinary: Negative for dysuria and flank pain.  Musculoskeletal: Negative for arthralgias, myalgias, neck pain and neck stiffness.  Skin: Negative for rash.  Neurological: Negative for dizziness, weakness and headaches.  All other systems reviewed and are negative.    Physical Exam Updated Vital Signs BP 146/99 (BP Location: Right  Arm)   Pulse 88   Temp 98.6 F (37 C) (Oral)   Resp 16   LMP 09/20/2015 (Exact Date)   SpO2 97%   Physical Exam  Constitutional: She appears well-developed and well-nourished. No distress.  HENT:  Head: Normocephalic.  Eyes: Conjunctivae are normal.  Neck: Neck supple.  Cardiovascular: Normal rate, regular rhythm and normal heart sounds.   Pulmonary/Chest: Effort normal and breath sounds normal. No respiratory distress. She has no wheezes. She has no rales. She exhibits tenderness.  Left sided chest wall tenderness  Abdominal: Soft. Bowel sounds are normal. She exhibits no distension. There is no tenderness. There is no rebound.  Musculoskeletal: She exhibits no edema.  Neurological: She is alert.  Skin: Skin is warm and dry.  Psychiatric: She has a normal mood and affect. Her behavior is normal.  Nursing note and vitals reviewed.    ED Treatments / Results  Labs (  all labs ordered are listed, but only abnormal results are displayed) Labs Reviewed  BASIC METABOLIC PANEL - Abnormal; Notable for the following:       Result Value   Potassium 3.3 (*)    Glucose, Bld 100 (*)    Calcium 8.3 (*)    Anion gap 4 (*)    All other components within normal limits  CBC - Abnormal; Notable for the following:    Hemoglobin 10.9 (*)    HCT 34.8 (*)    MCH 24.5 (*)    RDW 17.1 (*)    All other components within normal limits  I-STAT TROPOININ, ED    EKG  EKG Interpretation  Date/Time:  Monday January 20 2016 09:11:29 EST Ventricular Rate:  89 PR Interval:    QRS Duration: 91 QT Interval:  385 QTC Calculation: 469 R Axis:   22 Text Interpretation:  Sinus rhythm No significant change since last tracing Confirmed by LITTLE MD, RACHEL [phone]) on 01/20/2016 4:05:37 PM       Radiology Dg Chest 2 View  Result Date: 01/20/2016 CLINICAL DATA:  New onset left-sided chest pain. EXAM: CHEST  2 VIEW COMPARISON:  Two-view chest x-ray 08/03/2013. FINDINGS: The heart size is normal.  Minimal left basilar airspace disease is present. The right lung is clear. There is no edema or effusion. The upper lung fields are clear bilaterally. The visualized soft tissues and bony thorax are unremarkable. IMPRESSION: 1. Ill-defined left lower lobe airspace disease. While this may represent atelectasis, early infection is also considered. Electronically Signed   By: San Morelle M.D.   On: 01/20/2016 09:56    Procedures Procedures (including critical care time)  Medications Ordered in ED Medications - No data to display   Initial Impression / Assessment and Plan / ED Course  I have reviewed the triage vital signs and the nursing notes.  Pertinent labs & imaging results that were available during my care of the patient were reviewed by me and considered in my medical decision making (see chart for details).  Clinical Course     Patient with left-sided chest pain, constant since early this morning, with cough. Pain is her producible with palpation of the left breast. No rashes, bruising, erythema, masses palpated. Lab work obtained and was unremarkable other than potassium of 3.3. Normal white blood cell count. Afebrile. Normal troponin. CXR showing possible left lower airspace disease. Will cover with zithromax. Although I suspect her pain is most likely chest wall pain. Will dc home with close outpatient follow up.  Vitals:   01/20/16 0904  BP: 146/99  Pulse: 88  Resp: 16  Temp: 98.6 F (37 C)  TempSrc: Oral  SpO2: 97%     Final Clinical Impressions(s) / ED Diagnoses   Final diagnoses:  Community acquired pneumonia of left lung, unspecified part of lung    New Prescriptions Discharge Medication List as of 01/20/2016 11:26 AM    START taking these medications   Details  azithromycin (ZITHROMAX) 250 MG tablet Take 1 tablet (250 mg total) by mouth daily. Take first 2 tablets together, then 1 every day until finished., Starting Mon 01/20/2016, Print           Jeannett Senior, PA-C 01/20/16 [place], MD 01/20/16 1651

## 2016-02-15 ENCOUNTER — Other Ambulatory Visit: Payer: Self-pay | Admitting: Obstetrics and Gynecology

## 2016-03-19 ENCOUNTER — Ambulatory Visit (INDEPENDENT_AMBULATORY_CARE_PROVIDER_SITE_OTHER): Payer: Self-pay | Admitting: Psychiatry

## 2016-03-19 ENCOUNTER — Encounter (HOSPITAL_COMMUNITY): Payer: Self-pay | Admitting: Psychiatry

## 2016-03-19 VITALS — BP 122/80 | HR 99 | Ht 67.0 in | Wt 216.0 lb

## 2016-03-19 DIAGNOSIS — Z79891 Long term (current) use of opiate analgesic: Secondary | ICD-10-CM

## 2016-03-19 DIAGNOSIS — F331 Major depressive disorder, recurrent, moderate: Secondary | ICD-10-CM

## 2016-03-19 DIAGNOSIS — Z79899 Other long term (current) drug therapy: Secondary | ICD-10-CM

## 2016-03-19 DIAGNOSIS — F422 Mixed obsessional thoughts and acts: Secondary | ICD-10-CM

## 2016-03-19 DIAGNOSIS — F411 Generalized anxiety disorder: Secondary | ICD-10-CM

## 2016-03-19 DIAGNOSIS — Z811 Family history of alcohol abuse and dependence: Secondary | ICD-10-CM

## 2016-03-19 DIAGNOSIS — Z813 Family history of other psychoactive substance abuse and dependence: Secondary | ICD-10-CM

## 2016-03-19 MED ORDER — QUETIAPINE FUMARATE 100 MG PO TABS: 100.0000 mg | ORAL_TABLET | Freq: Every day | ORAL | 1 refills | Status: DC

## 2016-03-19 MED ORDER — BUPROPION HCL ER (XL) 150 MG PO TB24: 150.0000 mg | ORAL_TABLET | Freq: Every day | ORAL | 1 refills | Status: DC

## 2016-03-19 MED ORDER — VENLAFAXINE HCL ER 150 MG PO CP24: 150.0000 mg | ORAL_CAPSULE | Freq: Every day | ORAL | 1 refills | Status: DC
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
## 2016-03-30 ENCOUNTER — Encounter: Payer: Self-pay | Admitting: Obstetrics and Gynecology

## 2016-03-30 ENCOUNTER — Ambulatory Visit (INDEPENDENT_AMBULATORY_CARE_PROVIDER_SITE_OTHER): Payer: BLUE CROSS/BLUE SHIELD | Admitting: Obstetrics and Gynecology

## 2016-03-30 VITALS — BP 116/80 | HR 108 | Temp 98.4°F | Wt 215.0 lb

## 2016-03-30 DIAGNOSIS — I10 Essential (primary) hypertension: Secondary | ICD-10-CM

## 2016-03-30 DIAGNOSIS — M545 Low back pain, unspecified: Secondary | ICD-10-CM

## 2016-03-30 DIAGNOSIS — F411 Generalized anxiety disorder: Secondary | ICD-10-CM | POA: Diagnosis not present

## 2016-03-30 DIAGNOSIS — M549 Dorsalgia, unspecified: Secondary | ICD-10-CM | POA: Insufficient documentation

## 2016-03-30 MED ORDER — HYDROCHLOROTHIAZIDE 25 MG PO TABS: 25.0000 mg | ORAL_TABLET | Freq: Every day | ORAL | 2 refills | Status: DC

## 2016-03-30 MED ORDER — AMLODIPINE BESYLATE 10 MG PO TABS: 10.0000 mg | ORAL_TABLET | Freq: Every day | ORAL | 2 refills | Status: DC

## 2016-03-30 MED ORDER — CYCLOBENZAPRINE HCL 10 MG PO TABS: 10.0000 mg | ORAL_TABLET | Freq: Three times a day (TID) | ORAL | 0 refills | Status: DC | PRN

## 2016-03-30 NOTE — Progress Notes (Signed)
     Subjective: Chief Complaint  Patient presents with  . Back Pain     HPI: Wendy Duffy is a 41 y.o. presenting to clinic today to discuss the following:  #BACK PAIN Going on for 2-3 weeks Thinks it is from sleep wrong Localized to the lower back in the midline with shooting pain to the right on the side No radiation down the leg Has tried icy-hot and massage, ibuprofen which all help Worse pain when getting up or moving from sitting to standing Better with rest History of trauma or injury: no Prior history of similar pain: no No new medications Incontinence of bowel or bladder: no Numbness of leg: no Fever: no Rash: no   #Hypertension Blood pressure at home: does not monitor Blood pressure today: good, patient is happy her BP doing so well. States she has made som lifestyle changes Taking Meds: yes Side effects: none ROS: Denies headache, dizziness, visual changes, nausea, vomiting, chest pain, abdominal pain or shortness of breath.  #Mental Health Goes to [facility] and follows with psychiatry Is being treated for depression and anxiety States that she feels improved and that appointment for beneficial Still getting anxious at times Compliant with medications  Health Maintenance Due  Topic Date Due  . INFLUENZA VACCINE  10/15/2015    ROS noted in HPI.  Past Medical, Surgical, Social, and Family History Reviewed & Updated per EMR. History  Smoking Status  . Never Smoker  Smokeless Tobacco  . Never Used    Objective: BP 116/80   Pulse (!) 108   Temp 98.4 F (36.9 C) (Oral)   Wt 215 lb (97.5 kg)   LMP 09/20/2015 (Exact Date)   SpO2 99%   BMI 33.67 kg/m  Vitals and nursing notes reviewed  Physical Exam  Constitutional: She is well-developed, well-nourished, and in no distress.  Cardiovascular: Normal rate and regular rhythm.   Pulmonary/Chest: Effort normal.  Musculoskeletal:       Lumbar back: She exhibits decreased range of  motion, tenderness and pain. She exhibits no swelling and no deformity.  Pain with forward flexion. Tenderness to right side of lower back. Inspection unremarkable.   Neurological: She has normal strength. Gait abnormal.  Psychiatric: Mood and affect normal.    Assessment/Plan: Please see problem based Assessment and Plan PATIENT EDUCATION PROVIDED: See AVS    Meds ordered this encounter  Medications  . cyclobenzaprine (FLEXERIL) 10 MG tablet    Sig: Take 1 tablet (10 mg total) by mouth 3 (three) times daily as needed for muscle spasms.    Dispense:  30 tablet    Refill:  0  . amLODipine (NORVASC) 10 MG tablet    Sig: Take 1 tablet (10 mg total) by mouth daily.    Dispense:  60 tablet    Refill:  2  . hydrochlorothiazide (HYDRODIURIL) 25 MG tablet    Sig: Take 1 tablet (25 mg total) by mouth daily.    Dispense:  60 tablet    Refill:  [place], DO 03/30/2016, 2:45 PM PGY-3, [facility]

## 2016-03-30 NOTE — Patient Instructions (Addendum)
Medications sent to pharmacy Will get blood work and contact you about results Take ibuprofen x2 a day for 5 days for back pain. Call if not improved by Friday. Continue heat, back exercises, muscle relaxant.   Back Pain, Adult Back pain is very common in adults.The cause of back pain is rarely dangerous and the pain often gets better over time.The cause of your back pain may not be known. Some common causes of back pain include:  Strain of the muscles or ligaments supporting the spine.  Wear and tear (degeneration) of the spinal disks.  Arthritis.  Direct injury to the back. For many people, back pain may return. Since back pain is rarely dangerous, most people can learn to manage this condition on their own. Follow these instructions at home: Watch your back pain for any changes. The following actions may help to lessen any discomfort you are feeling:  Remain active. It is stressful on your back to sit or stand in one place for long periods of time. Do not sit, drive, or stand in one place for more than 30 minutes at a time. Take short walks on even surfaces as soon as you are able.Try to increase the length of time you walk each day.  Exercise regularly as directed by your health care provider. Exercise helps your back heal faster. It also helps avoid future injury by keeping your muscles strong and flexible.  Do not stay in bed.Resting more than 1-2 days can delay your recovery.  Pay attention to your body when you bend and lift. The most comfortable positions are those that put less stress on your recovering back. Always use proper lifting techniques, including:  Bending your knees.  Keeping the load close to your body.  Avoiding twisting.  Find a comfortable position to sleep. Use a firm mattress and lie on your side with your knees slightly bent. If you lie on your back, put a pillow under your knees.  Avoid feeling anxious or stressed.Stress increases muscle tension and  can worsen back pain.It is important to recognize when you are anxious or stressed and learn ways to manage it, such as with exercise.  Take medicines only as directed by your health care provider. Over-the-counter medicines to reduce pain and inflammation are often the most helpful.Your health care provider may prescribe muscle relaxant drugs.These medicines help dull your pain so you can more quickly return to your normal activities and healthy exercise.  Apply ice to the injured area:  Put ice in a plastic bag.  Place a towel between your skin and the bag.  Leave the ice on for 20 minutes, 2-3 times a day for the first 2-3 days. After that, ice and heat may be alternated to reduce pain and spasms.  Maintain a healthy weight. Excess weight puts extra stress on your back and makes it difficult to maintain good posture. Contact a health care provider if:  You have pain that is not relieved with rest or medicine.  You have increasing pain going down into the legs or buttocks.  You have pain that does not improve in one week.  You have night pain.  You lose weight.  You have a fever or chills. Get help right away if:  You develop new bowel or bladder control problems.  You have unusual weakness or numbness in your arms or legs.  You develop nausea or vomiting.  You develop abdominal pain.  You feel faint. This information is not intended to replace  advice given to you by your health care provider. Make sure you discuss any questions you have with your health care provider. Document Released: 03/02/2005 Document Revised: 07/11/2015 Document Reviewed: 07/04/2013 Elsevier Interactive Patient Education  2017 [place].   Back Exercises Introduction If you have pain in your back, do these exercises 2-3 times each day or as told by your doctor. When the pain goes away, do the exercises once each day, but repeat the steps more times for each exercise (do more repetitions).  If you do not have pain in your back, do these exercises once each day or as told by your doctor. Exercises Single Knee to Chest  Do these steps 3-5 times in a row for each leg: 1. Lie on your back on a firm bed or the floor with your legs stretched out. 2. Bring one knee to your chest. 3. Hold your knee to your chest by grabbing your knee or thigh. 4. Pull on your knee until you feel a gentle stretch in your lower back. 5. Keep doing the stretch for 10-30 seconds. 6. Slowly let go of your leg and straighten it. Pelvic Tilt  Do these steps 5-10 times in a row: 1. Lie on your back on a firm bed or the floor with your legs stretched out. 2. Bend your knees so they point up to the ceiling. Your feet should be flat on the floor. 3. Tighten your lower belly (abdomen) muscles to press your lower back against the floor. This will make your tailbone point up to the ceiling instead of pointing down to your feet or the floor. 4. Stay in this position for 5-10 seconds while you gently tighten your muscles and breathe evenly. Cat-Cow  Do these steps until your lower back bends more easily: 1. Get on your hands and knees on a firm surface. Keep your hands under your shoulders, and keep your knees under your hips. You may put padding under your knees. 2. Let your head hang down, and make your tailbone point down to the floor so your lower back is round like the back of a cat. 3. Stay in this position for 5 seconds. 4. Slowly lift your head and make your tailbone point up to the ceiling so your back hangs low (sags) like the back of a cow. 5. Stay in this position for 5 seconds. Press-Ups  Do these steps 5-10 times in a row: 1. Lie on your belly (face-down) on the floor. 2. Place your hands near your head, about shoulder-width apart. 3. While you keep your back relaxed and keep your hips on the floor, slowly straighten your arms to raise the top half of your body and lift your shoulders. Do not use your  back muscles. To make yourself more comfortable, you may change where you place your hands. 4. Stay in this position for 5 seconds. 5. Slowly return to lying flat on the floor. Bridges  Do these steps 10 times in a row: 1. Lie on your back on a firm surface. 2. Bend your knees so they point up to the ceiling. Your feet should be flat on the floor. 3. Tighten your butt muscles and lift your butt off of the floor until your waist is almost as high as your knees. If you do not feel the muscles working in your butt and the back of your thighs, slide your feet 1-2 inches farther away from your butt. 4. Stay in this position for 3-5 seconds. 5.  Slowly lower your butt to the floor, and let your butt muscles relax. If this exercise is too easy, try doing it with your arms crossed over your chest. Belly Crunches  Do these steps 5-10 times in a row: 1. Lie on your back on a firm bed or the floor with your legs stretched out. 2. Bend your knees so they point up to the ceiling. Your feet should be flat on the floor. 3. Cross your arms over your chest. 4. Tip your chin a little bit toward your chest but do not bend your neck. 5. Tighten your belly muscles and slowly raise your chest just enough to lift your shoulder blades a tiny bit off of the floor. 6. Slowly lower your chest and your head to the floor. Back Lifts  Do these steps 5-10 times in a row: 1. Lie on your belly (face-down) with your arms at your sides, and rest your forehead on the floor. 2. Tighten the muscles in your legs and your butt. 3. Slowly lift your chest off of the floor while you keep your hips on the floor. Keep the back of your head in line with the curve in your back. Look at the floor while you do this. 4. Stay in this position for 3-5 seconds. 5. Slowly lower your chest and your face to the floor. Contact a doctor if:  Your back pain gets a lot worse when you do an exercise.  Your back pain does not lessen 2 hours after  you exercise. If you have any of these problems, stop doing the exercises. Do not do them again unless your doctor says it is okay. Get help right away if:  You have sudden, very bad back pain. If this happens, stop doing the exercises. Do not do them again unless your doctor says it is okay. This information is not intended to replace advice given to you by your health care provider. Make sure you discuss any questions you have with your health care provider. Document Released: 04/04/2010 Document Revised: 08/08/2015 Document Reviewed: 04/26/2014  2017 Elsevier

## 2016-03-31 NOTE — Assessment & Plan Note (Signed)
BP controlled today. Best it has been in a while. Continue current therapies. Refills given. If continues to improve BP with lifestyle changes can considering removing some of her antihypertensives.

## 2016-03-31 NOTE — Assessment & Plan Note (Signed)
Acute back pain. MSK in nature. No red flags. Will treat conservatively. Patient to continue ibuprofen, heating pad. Given information on back exercises to help with pain. Rx for flexeril. Return in 3 weeks if not improved.

## 2016-03-31 NOTE — Assessment & Plan Note (Signed)
Follows with [facility]. Patient mood appropriate. Will continue to follow along.

## 2016-05-21 ENCOUNTER — Ambulatory Visit (INDEPENDENT_AMBULATORY_CARE_PROVIDER_SITE_OTHER): Payer: Self-pay | Admitting: Psychiatry

## 2016-05-21 ENCOUNTER — Encounter (HOSPITAL_COMMUNITY): Payer: Self-pay | Admitting: Psychiatry

## 2016-05-21 DIAGNOSIS — F331 Major depressive disorder, recurrent, moderate: Secondary | ICD-10-CM

## 2016-05-21 DIAGNOSIS — F411 Generalized anxiety disorder: Secondary | ICD-10-CM

## 2016-05-21 DIAGNOSIS — Z811 Family history of alcohol abuse and dependence: Secondary | ICD-10-CM

## 2016-05-21 DIAGNOSIS — F422 Mixed obsessional thoughts and acts: Secondary | ICD-10-CM

## 2016-05-21 DIAGNOSIS — Z813 Family history of other psychoactive substance abuse and dependence: Secondary | ICD-10-CM

## 2016-05-21 DIAGNOSIS — Z79899 Other long term (current) drug therapy: Secondary | ICD-10-CM

## 2016-05-21 MED ORDER — BUPROPION HCL ER (XL) 150 MG PO TB24: 150.0000 mg | ORAL_TABLET | Freq: Every day | ORAL | 1 refills | Status: DC

## 2016-05-21 MED ORDER — VENLAFAXINE HCL ER 150 MG PO CP24: 150.0000 mg | ORAL_CAPSULE | Freq: Every day | ORAL | 1 refills | Status: DC

## 2016-05-21 MED ORDER — QUETIAPINE FUMARATE 100 MG PO TABS: 100.0000 mg | ORAL_TABLET | Freq: Every day | ORAL | 1 refills | Status: DC
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
## 2016-07-20 ENCOUNTER — Ambulatory Visit (INDEPENDENT_AMBULATORY_CARE_PROVIDER_SITE_OTHER): Payer: BLUE CROSS/BLUE SHIELD | Admitting: Obstetrics and Gynecology

## 2016-07-20 ENCOUNTER — Encounter: Payer: Self-pay | Admitting: Obstetrics and Gynecology

## 2016-07-20 VITALS — BP 110/78 | HR 87 | Temp 98.8°F | Wt 211.0 lb

## 2016-07-20 DIAGNOSIS — J3089 Other allergic rhinitis: Secondary | ICD-10-CM | POA: Diagnosis not present

## 2016-07-20 MED ORDER — LOSARTAN POTASSIUM 100 MG PO TABS: 100.0000 mg | ORAL_TABLET | Freq: Every day | ORAL | 1 refills | Status: DC

## 2016-07-20 MED ORDER — CETIRIZINE HCL 10 MG PO TABS: 10.0000 mg | ORAL_TABLET | Freq: Every day | ORAL | 2 refills | Status: DC | PRN

## 2016-07-20 MED ORDER — FLUTICASONE PROPIONATE 50 MCG/ACT NA SUSP: 2.0000 | Freq: Every day | NASAL | 6 refills | Status: DC

## 2016-07-20 NOTE — Progress Notes (Signed)
   Subjective:   Patient ID: Wendy Duffy, female    DOB: 09/13/1975, 41 y.o.   MRN: [id number]  Patient presents for Same Day Appointment  Chief Complaint  Patient presents with  . Nasal Congestion  . Cough  . 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   History  Smoking Status  . Never Smoker  Smokeless Tobacco  . Never Used    Past medical history, surgical, family, and social history reviewed and updated in the EMR as appropriate.  Objective:  BP 110/78   Pulse 87   Temp 98.8 F (37.1 C) (Oral)   Wt 211 lb (95.7 kg)   LMP 09/20/2015 (Exact Date)   SpO2 98%   BMI 39.87 kg/m  Vitals and nursing note reviewed  Physical Exam  Constitutional: She is well-developed, well-nourished, and in no distress.  HENT:  Right Ear: Tympanic membrane, external ear and ear canal normal.  Left Ear: Tympanic membrane, external ear and ear canal normal.  Nose: Mucosal edema present.  Mouth/Throat: Oropharynx is clear and moist.  Eyes: Conjunctivae and EOM are normal. Pupils are equal, round, and reactive to light.  Neck: Normal range of motion. Neck supple.  Cardiovascular: Normal rate, regular rhythm and normal heart sounds.   Pulmonary/Chest: Effort normal and breath sounds normal. She has no wheezes. She exhibits tenderness.  Lymphadenopathy:    She has no cervical adenopathy.    Assessment & Plan:  1.  Environmental and seasonal allergies Consistent with allergies. Patient is well-appearing vitals are stable here in clinic. Rx sent to pharmacy for Zyrtec. No red flags. Follow-up in clinic as needed. - cetirizine (ZYRTEC) 10 MG tablet; Take 1 tablet (10 mg total) by mouth daily as needed for allergies.  Dispense: 30 tablet; Refill: 2  PATIENT EDUCATION PROVIDED: See AVS   [name], DO 07/20/2016, 9:55 AM PGY-3, [place]

## 2016-07-20 NOTE — Patient Instructions (Signed)
Allergic Rhinitis Allergic rhinitis is when the mucous membranes in the nose respond to allergens. Allergens are particles in the air that cause your body to have an allergic reaction. This causes you to release allergic antibodies. Through a chain of events, these eventually cause you to release histamine into the blood stream. Although meant to protect the body, it is this release of histamine that causes your discomfort, such as frequent sneezing, congestion, and an itchy, runny nose. What are the causes? Seasonal allergic rhinitis (hay fever) is caused by pollen allergens that may come from grasses, trees, and weeds. Year-round allergic rhinitis (perennial allergic rhinitis) is caused by allergens such as house dust mites, pet dander, and mold spores. What are the signs or symptoms?  Nasal stuffiness (congestion).  Itchy, runny nose with sneezing and tearing of the eyes. How is this diagnosed? Your health care provider can help you determine the allergen or allergens that trigger your symptoms. If you and your health care provider are unable to determine the allergen, skin or blood testing may be used. Your health care provider will diagnose your condition after taking your health history and performing a physical exam. Your health care provider may assess you for other related conditions, such as asthma, pink eye, or an ear infection. How is this treated? Allergic rhinitis does not have a cure, but it can be controlled by:  Medicines that block allergy symptoms. These may include allergy shots, nasal sprays, and oral antihistamines.  Avoiding the allergen. Hay fever may often be treated with antihistamines in pill or nasal spray forms. Antihistamines block the effects of histamine. There are over-the-counter medicines that may help with nasal congestion and swelling around the eyes. Check with your health care provider before taking or giving this medicine. If avoiding the allergen or the  medicine prescribed do not work, there are many new medicines your health care provider can prescribe. Stronger medicine may be used if initial measures are ineffective. Desensitizing injections can be used if medicine and avoidance does not work. Desensitization is when a patient is given ongoing shots until the body becomes less sensitive to the allergen. Make sure you follow up with your health care provider if problems continue. Follow these instructions at home: It is not possible to completely avoid allergens, but you can reduce your symptoms by taking steps to limit your exposure to them. It helps to know exactly what you are allergic to so that you can avoid your specific triggers. Contact a health care provider if:  You have a fever.  You develop a cough that does not stop easily (persistent).  You have shortness of breath.  You start wheezing.  Symptoms interfere with normal daily activities. This information is not intended to replace advice given to you by your health care provider. Make sure you discuss any questions you have with your health care provider. Document Released: 11/25/2000 Document Revised: 11/01/2015 Document Reviewed: 11/07/2012 Elsevier Interactive Patient Education  2017 [employer]  

## 2016-08-20 ENCOUNTER — Encounter: Payer: Self-pay | Admitting: Student

## 2016-08-20 ENCOUNTER — Ambulatory Visit (INDEPENDENT_AMBULATORY_CARE_PROVIDER_SITE_OTHER): Payer: 59 | Admitting: Student

## 2016-08-20 DIAGNOSIS — H6690 Otitis media, unspecified, unspecified ear: Secondary | ICD-10-CM | POA: Diagnosis not present

## 2016-08-20 MED ORDER — AMOXICILLIN-POT CLAVULANATE 875-125 MG PO TABS: 1.0000 | ORAL_TABLET | Freq: Two times a day (BID) | ORAL | 0 refills | Status: DC

## 2016-08-20 NOTE — Progress Notes (Signed)
Subjective:    Patient ID: Wendy Duffy, female    DOB: 1975/04/12, 41 y.o.   MRN: [id number]   CC: Left ear pain  HPI: 41 y/o F present for left ear pain  Left ear pain - started yesterday - fullness, reduced hearing, no fevers - some throat pain yesterday but none today - else no known sick contacts but she does work in a preschool  Smoking status reviewed  Review of Systems  Per HPI, else denies chest pain, shortness of breath,     Objective:  BP 108/70   Pulse 100   Temp 98.4 F (36.9 C) (Oral)   Wt 210 lb (95.3 kg)   LMP 09/20/2015 (Exact Date)   SpO2 99%   BMI 39.68 kg/m  Vitals and nursing note reviewed  General: NAD HEENT: normal right ear canal and TM, left ear canal with purulent material, after curretting it away left TM appears erythematous, no fluid seen Cardiac: RRR Respiratory: CTAB, normal effort Extremities: no edema or cyanosis. WWP. Skin: warm and dry, no rashes noted Neuro: alert and oriented, no focal deficits   Assessment & Plan:    Otitis media History and physical exam consistent with otitis media - will treat with augmentin - follow in 2 weeks or earlier if no improvement in symptoms    Atiyah Bauer A. Kennon Rounds MD, MS Family Medicine Resident PGY-3 Pager [phone]

## 2016-08-20 NOTE — Patient Instructions (Signed)
Follow up after completion of antibiotic course if ear pain not improved Call the office with questions or concerns or if you feel you are getting worse

## 2016-08-20 NOTE — Assessment & Plan Note (Signed)
History and physical exam consistent with otitis media - will treat with augmentin - follow in 2 weeks or earlier if no improvement in symptoms

## 2016-09-17 ENCOUNTER — Encounter (HOSPITAL_COMMUNITY): Payer: Self-pay | Admitting: Psychiatry

## 2016-09-17 ENCOUNTER — Ambulatory Visit (INDEPENDENT_AMBULATORY_CARE_PROVIDER_SITE_OTHER): Payer: 59 | Admitting: Psychiatry

## 2016-09-17 DIAGNOSIS — F331 Major depressive disorder, recurrent, moderate: Secondary | ICD-10-CM | POA: Diagnosis not present

## 2016-09-17 DIAGNOSIS — F411 Generalized anxiety disorder: Secondary | ICD-10-CM

## 2016-09-17 DIAGNOSIS — Z813 Family history of other psychoactive substance abuse and dependence: Secondary | ICD-10-CM

## 2016-09-17 DIAGNOSIS — F422 Mixed obsessional thoughts and acts: Secondary | ICD-10-CM | POA: Diagnosis not present

## 2016-09-17 DIAGNOSIS — Z811 Family history of alcohol abuse and dependence: Secondary | ICD-10-CM

## 2016-09-17 LAB — COMPREHENSIVE METABOLIC PANEL
ALBUMIN: 4 g/dL (ref 3.6–5.1)
ALK PHOS: 66 U/L (ref 33–115)
ALT: 21 U/L (ref 6–29)
AST: 20 U/L (ref 10–30)
BUN: 12 mg/dL (ref 7–25)
CALCIUM: 9.1 mg/dL (ref 8.6–10.2)
CO2: 22 mmol/L (ref 20–31)
Chloride: 102 mmol/L (ref 98–110)
Creat: 0.81 mg/dL (ref 0.50–1.10)
Glucose, Bld: 67 mg/dL (ref 65–99)
POTASSIUM: 4 mmol/L (ref 3.5–5.3)
SODIUM: 138 mmol/L (ref 135–146)
Total Bilirubin: 0.6 mg/dL (ref 0.2–1.2)
Total Protein: 6.8 g/dL (ref 6.1–8.1)

## 2016-09-17 LAB — CBC
HCT: 42.4 % (ref 35.0–45.0)
Hemoglobin: 14.2 g/dL (ref 11.7–15.5)
MCH: 30.8 pg (ref 27.0–33.0)
MCHC: 33.5 g/dL (ref 32.0–36.0)
MCV: 92 fL (ref 80.0–100.0)
MPV: 9.8 fL (ref 7.5–12.5)
PLATELETS: 314 10*3/uL (ref 140–400)
RBC: 4.61 MIL/uL (ref 3.80–5.10)
RDW: 13.8 % (ref 11.0–15.0)
WBC: 5.3 10*3/uL (ref 3.8–10.8)

## 2016-09-17 LAB — LIPID PANEL
CHOLESTEROL: 148 mg/dL (ref <200)
HDL: 86 mg/dL (ref >50)
LDL Cholesterol: 38 mg/dL (ref <100)
Total CHOL/HDL Ratio: 1.7 Ratio (ref <5.0)
Triglycerides: 121 mg/dL (ref <150)
VLDL: 24 mg/dL (ref <30)

## 2016-09-17 LAB — TSH: TSH: 0.58 m[IU]/L

## 2016-09-17 MED ORDER — BUPROPION HCL ER (XL) 150 MG PO TB24: 150.0000 mg | ORAL_TABLET | Freq: Every day | ORAL | 1 refills | Status: DC

## 2016-09-17 MED ORDER — VENLAFAXINE HCL ER 150 MG PO CP24: 150.0000 mg | ORAL_CAPSULE | Freq: Every day | ORAL | 1 refills | Status: DC

## 2016-09-17 MED ORDER — QUETIAPINE FUMARATE 100 MG PO TABS: 100.0000 mg | ORAL_TABLET | Freq: Every day | ORAL | 1 refills | Status: DC

## 2016-09-17 NOTE — Progress Notes (Signed)
BH MD/PA/NP OP Progress Note  09/17/2016 10:15 AM Wendy Duffy  MRN:  [id number]  Chief Complaint:  Chief Complaint    Follow-up      HPI: "I am awesome". Pt states she feels good and is in a good place. She reports some stressors- son got his 41yo girlfriend pregnant but she is dealing with it. The baby is due in November.  Work is busy. After work she likes to relax by cooking int he kitchen by herself. Her husband likes to talk which irritates the patient. Pt states this causes some anxiety for about 3 hrs. It will last until she takes her Seroquel and then she is able to sleep well. She wakes up at least once a night but is able to fall back asleep easily. Energy is good.  Pt denies depression. Denies anhedonia, isolation, crying spells, low motivation, poor hygiene, worthlessness and hopelessness. Denies SI/HI/AVH.   Pt reports she is not worrying or checking the doors and stoves over and over. She feels she is doing well.  Taking meds as prescribed and denies SE.   Pt still has not followed up with Ob/gyn or get her labs. Pt denies pain, discharge from the breasts. Pt does not have periods as she had a hysterectomy.   Visit Diagnosis:    ICD-10-CM   1. MDD (major depressive disorder), recurrent episode, moderate (HCC) F33.1 venlafaxine XR (EFFEXOR-XR) 150 MG 24 hr capsule    QUEtiapine (SEROQUEL) 100 MG tablet    buPROPion (WELLBUTRIN XL) 150 MG 24 hr tablet  2. GAD (generalized anxiety disorder) F41.1 venlafaxine XR (EFFEXOR-XR) 150 MG 24 hr capsule    QUEtiapine (SEROQUEL) 100 MG tablet  3. Mixed obsessional thoughts and acts F42.2 venlafaxine XR (EFFEXOR-XR) 150 MG 24 hr capsule      Past Psychiatric History:  Anxiety: Yes Bipolar Disorder: No Depression: Yes Mania: No Psychosis: No Schizophrenia: No Personality Disorder: No Hospitalization for psychiatric illness: No History of Electroconvulsive Shock Therapy: No Prior Suicide Attempts: Yes  Past Medical  History:  Past Medical History:  Diagnosis Date  . Anemia   . Anxiety   . Depression   . Gastritis   . Hemorrhoids   . Hypertension   . Sleep apnea    questionable refered for sleep study will schedule appointment   . Status post laparoscopic hysterectomy 10/21/2015   TLH with BSO    Past Surgical History:  Procedure Laterality Date  . BILATERAL SALPINGECTOMY Bilateral 10/21/2015   Procedure: BILATERAL SALPINGECTOMY;  Surgeon: Sherlyn Hay, DO;  Location: [facility];  Service: Gynecology;  Laterality: Bilateral;  . [place], 2003  . LAPAROSCOPIC HYSTERECTOMY N/A 10/21/2015   Procedure: HYSTERECTOMY TOTAL LAPAROSCOPIC;  Surgeon: Sherlyn Hay, DO;  Location: [facility];  Service: Gynecology;  Laterality: N/A;  . WISDOM TOOTH EXTRACTION      Family Psychiatric History:  Family History  Problem Relation Age of Onset  . Alcohol abuse Mother   . Drug abuse Maternal Uncle   . Alcohol abuse Maternal Grandmother   . Drug abuse Maternal Grandmother     Social History:  Social History   Social History  . Marital status: Married    Spouse name: N/A  . Number of children: N/A  . Years of education: N/A   Occupational History  . preschool teacher    Social History Main Topics  . Smoking status: Never Smoker  . Smokeless tobacco: Never Used  . Alcohol use 1.8 oz/week  1 Glasses of wine, 2 Cans of beer per week     Comment: weekly  . Drug use: No     Comment: last used THC in November 2015. using a few times a year  . Sexual activity: Yes    Birth control/ protection: None   Other Topics Concern  . None   Social History Narrative  . None    Allergies:  Allergies  Allergen Reactions  . Zoloft [Sertraline Hcl]     Made her feel very anxious     Metabolic Disorder Labs: Lab Results  Component Value Date   HGBA1C 5.4 05/23/2015   MPG 108 05/23/2015   MPG 97 06/21/2014   Lab Results  Component Value Date   PROLACTIN 5.7 05/23/2015    PROLACTIN 15.8 06/21/2014   Lab Results  Component Value Date   CHOL 141 05/23/2015   TRIG 104 05/23/2015   HDL 59 05/23/2015   CHOLHDL 2.4 05/23/2015   VLDL 21 05/23/2015   LDLCALC 61 05/23/2015   LDLCALC 77 06/21/2014     Current Medications: Current Outpatient Prescriptions  Medication Sig Dispense Refill  . amLODipine (NORVASC) 10 MG tablet Take 1 tablet (10 mg total) by mouth daily. 60 tablet 2  . buPROPion (WELLBUTRIN XL) 150 MG 24 hr tablet Take 1 tablet (150 mg total) by mouth daily. 90 tablet 1  . cetirizine (ZYRTEC) 10 MG tablet Take 1 tablet (10 mg total) by mouth daily as needed for allergies. 30 tablet 2  . cyclobenzaprine (FLEXERIL) 10 MG tablet Take 1 tablet (10 mg total) by mouth 3 (three) times daily as needed for muscle spasms. 30 tablet 0  . fluticasone (FLONASE) 50 MCG/ACT nasal spray Place 2 sprays into both nostrils daily. 16 g 6  . hydrochlorothiazide (HYDRODIURIL) 25 MG tablet Take 1 tablet (25 mg total) by mouth daily. 60 tablet 2  . ibuprofen (ADVIL,MOTRIN) 800 MG tablet Take 1 tablet (800 mg total) by mouth every 6 (six) hours as needed for mild pain, moderate pain or cramping. 40 tablet 1  . losartan (COZAAR) 100 MG tablet Take 1 tablet (100 mg total) by mouth daily. 60 tablet 1  . omeprazole (PRILOSEC) 40 MG capsule take 1 capsule by mouth once daily 90 capsule 6  . QUEtiapine (SEROQUEL) 100 MG tablet Take 1 tablet (100 mg total) by mouth at bedtime. 90 tablet 1  . venlafaxine XR (EFFEXOR-XR) 150 MG 24 hr capsule Take 1 capsule (150 mg total) by mouth daily. 90 capsule 1   No current facility-administered medications for this visit.    Musculoskeletal: Strength & Muscle Tone: within normal limits Gait & Station: normal Patient leans: N/A  Psychiatric Specialty Exam: Review of Systems  Musculoskeletal: Positive for back pain. Negative for joint pain and neck pain.  Neurological: Negative for dizziness, tingling, tremors, sensory change and headaches.   Psychiatric/Behavioral: Negative for depression, hallucinations, substance abuse and suicidal ideas. The patient is nervous/anxious. The patient does not have insomnia.     Blood pressure 126/72, pulse 90, height 5\' 1"  (1.549 m), weight 204 lb 6.4 oz (92.7 kg), last menstrual period 09/20/2015.Body mass index is 38.62 kg/m.  General Appearance: Casual  Eye Contact:  Good  Speech:  Clear and Coherent and Normal Rate  Volume:  Normal  Mood:  Anxious  Affect:  Congruent  Thought Process:  Goal Directed and Descriptions of Associations: Intact  Orientation:  Full (Time, Place, and Person)  Thought Content: WDL   Suicidal Thoughts:  No  Homicidal Thoughts:  No  Memory:  Immediate;   Good Recent;   Good Remote;   Good  Judgement:  Good  Insight:  Good  Psychomotor Activity:  Normal  Concentration:  Concentration: Good and Attention Span: Good  Recall:  Good  Fund of Knowledge: Good  Language: Good  Akathisia:  No  Handed:  Right  AIMS (if indicated):  n/a  Assets:  Communication Skills Desire for Improvement Financial Resources/Insurance Housing Intimacy Leisure Time Resilience Social Support Talents/Skills Transportation  ADL's:  Intact  Cognition: WNL  Sleep:  good     Treatment Plan Summary:Medication management  Assessment: MDD-recurrent, moderate; GAD; r/o OCD   Medication management with supportive therapy. Risks/benefits and SE of the medication discussed. Pt verbalized understanding and verbal consent obtained for treatment.  Affirm with the patient that the medications are taken as ordered. Patient expressed understanding of how their medications were to be used.   Meds: Effexor ER 75mg  po qD for mood and anxiety Seroquel 100mg  po qHS for mood, anxiety and sleep Wellbutrin XL 150mg  po qD for sexual SE and depression   Labs: waiting on CBC, CMP, HbA1c, Lipid panel, TSH, Prolactin level, EKG   Therapy: brief supportive therapy provided. Discussed  psychosocial stressors in detail.     Consultations: continue individual therapy  Pt denies SI and is at an acute low risk for suicide. Patient told to call clinic if any problems occur. Patient advised to go to ER if they should develop SI/HI, side effects, or if symptoms worsen. Has crisis numbers to call if needed. Pt verbalized understanding.  F/up in 4 months or sooner if needed   [name], MD 09/17/2016, 10:15 AM

## 2016-09-18 LAB — HEMOGLOBIN A1C
Hgb A1c MFr Bld: 4.4 % (ref <5.7)
MEAN PLASMA GLUCOSE: 80 mg/dL

## 2016-09-18 LAB — PROLACTIN: Prolactin: 6.8 ng/mL

## 2016-10-08 ENCOUNTER — Encounter: Payer: Self-pay | Admitting: Internal Medicine

## 2016-10-08 ENCOUNTER — Ambulatory Visit (INDEPENDENT_AMBULATORY_CARE_PROVIDER_SITE_OTHER): Payer: 59 | Admitting: Internal Medicine

## 2016-10-08 VITALS — BP 130/98 | HR 96 | Temp 98.2°F | Wt 203.0 lb

## 2016-10-08 DIAGNOSIS — J029 Acute pharyngitis, unspecified: Secondary | ICD-10-CM | POA: Diagnosis not present

## 2016-10-08 LAB — POCT RAPID STREP A (OFFICE): Rapid Strep A Screen: NEGATIVE

## 2016-10-08 NOTE — Patient Instructions (Addendum)
It was nice meeting you today Wendy Duffy!  For your sore throat, you can use Chloraseptic throat spray and Cepachol throat lozenges from the drugstore. You can also try drinking honey mixed into a warm beverage 2-3 times a day, especially before bedtime. This will soothe your throat and also help with your cough. You can continue taking Tylenol and Advil to help with the pain.   If your symptoms do not get any better within the next few days, please schedule another appointment.   If you have any questions or concerns, please feel free to call the clinic.   Be well,  Dr. Avon Gully

## 2016-10-08 NOTE — Progress Notes (Signed)
   Subjective:   Patient: Wendy Duffy       Birthdate: [birth year]       MRN: [id number]      HPI  Wendy Duffy is a 41 y.o. female presenting for same day appt for sore throat.   Sore throat Began one week ago. Has been getting worse. Has also had nasal congestion and a cough productive of yellow sputum. Denies blood in sputum. Denies fevers, chills. Has had normal appetite. Became very hoarse yesterday. Has taken Zyrtec which helped with congestion. Has taken Tylenol and Advil which helped with pain. Has not tried anything else. 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   Smoking status reviewed. Patient is never smoker.   Review of Systems See HPI.     Objective:  Physical Exam  Constitutional: She is oriented to person, place, and time and well-developed, well-nourished, and in no distress.  Hoarse voice when speaking.   HENT:  Nose: Nose normal.  Mild oropharyngeal erythema, no exudates. MMM.   Eyes: Pupils are equal, round, and reactive to light. Conjunctivae and EOM are normal. Right eye exhibits no discharge. Left eye exhibits no discharge.  Cardiovascular: Normal rate, regular rhythm and normal heart sounds.   No murmur heard. Pulmonary/Chest: Effort normal and breath sounds normal. No respiratory distress. She has no wheezes.  Neurological: She is alert and oriented to person, place, and time.  Skin: Skin is warm and dry.  Psychiatric: Affect and judgment normal.      Assessment & Plan:  Sore throat Most consistent with viral etiology. Rapid strep neg. 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 Afebrile, MMM, no signs of dehydration on exam.  - Conservative management - Cepachol lozenges, Chloraseptic spray, honey, ibuprofen/Tylenol - F/u if no improvement in a week - Note written for work   Adin Hector, MD, MPH PGY-3 [facility] Pager [phone]

## 2016-10-09 NOTE — Assessment & Plan Note (Signed)
Most consistent with viral etiology. Rapid strep neg. 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 Afebrile, MMM, no signs of dehydration on exam.  - Conservative management - Cepachol lozenges, Chloraseptic spray, honey, ibuprofen/Tylenol - F/u if no improvement in a week - Note written for work

## 2016-10-29 NOTE — Progress Notes (Signed)
   [facility] Phone: [phone]   Date of Visit: 11/02/2016   HPI:  Lower Abdominal Pain:  - process of crampy lower abdominal pain. Symptoms consistent with menstrual cramps. - Symptoms started after she had a upper scopic total hysterectomy about one year ago. She had this done for abnormal uterine bleeding however pathology is all normal per chart review. - Symptoms are intermittent and occurs daily. o radiation of pain - Symptoms are worse with meals. She cannot recall any specific foods that exacerbate her pain but reports that she eats a lot of fatty foods. - No constipation. Does have loose stools after each meal. - no vaginal discharge, vaginal itching, dysuria, urinary frequency - No fevers, chills, night sweats. Reports she has noticed about 20 pound weight loss since her surgery one year ago. This is not necessarily intentional - Reports she had blood on the toilet paper when wiping her bottom once about 1-2 weeks ago.  - She thought her symptoms were part of her recovery from surgery. This is why she did not come earlier. Due to the persistent symptoms she came in today. Symptoms have been stable and not worsening. - No family history of any bowel issues or colon cancer. She does not know much about her father's medical history. Her mother died at a young age from a motor vehicle accident.  - has not tried any medications for symptoms   ROS: See HPI.  [place]:  PMH: Ventricular Hypertrophy HTN Migraine GAD Depression/anxiety S/p total hysterectomy 10/2015 Obesity  PHYSICAL EXAM: BP 128/90 (BP Location: Right Arm, Patient Position: Sitting, Cuff Size: Large)   Pulse (!) 108   Temp 98.4 F (36.9 C) (Oral)   Ht 5\' 1"  (1.549 m)   Wt 204 lb (92.5 kg)   LMP 09/20/2015 (Exact Date)   SpO2 98%   BMI 38.55 kg/m  GEN: NAD CV: RRR, no murmurs, rubs, or gallops PULM: CTAB, normal effort ABD: Soft, mild tenderness to palpation mainly in the lower  abdomen, but some in the RUQ and epigastric region. No guarding or rebound tenderness. Good bowel sounds.  SKIN: No rash or cyanosis; warm and well-perfused EXTR: No lower extremity edema or calf tenderness PSYCH: Mood and affect euthymic, normal rate and volume of speech NEURO: Awake, alert, no focal deficits grossly, normal speech Rectal Exam: normal tone. External hemorrhoids noted not thrombosed or bleeding. FOBT negative Anoscopy: small internal hemorrhoids noted but difficult to visualize due to stool but no obvious thrombosis and no bleeding.   ASSESSMENT/PLAN: 1. Abdominal pain, unspecified abdominal location Differentials include pain due to scar tissue from surgery one year ago but odd that this would cause diarrhea after meals. DDx also include infectious (unlikely), ischemic bowel (less likely due to her age and no risk factors). Considering IBD (unlikely UC as not bloody), celiac disease, irritable bowel syndrome. FOBT negative. Will get lab work to further evaluate. OTC tylenol/ibuprofen for pain   - CBC - Comprehensive metabolic panel - Sedimentation rate - C-reactive protein  2. Blood in stool She does have small internal and external hemorrhoids which are not thrombosed or bleeding. FOBT is negative.  - CBC - POCT occult blood stool  Smiley Houseman, MD PGY [place]

## 2016-11-02 ENCOUNTER — Ambulatory Visit (INDEPENDENT_AMBULATORY_CARE_PROVIDER_SITE_OTHER): Payer: 59 | Admitting: Internal Medicine

## 2016-11-02 VITALS — BP 128/90 | HR 108 | Temp 98.4°F | Ht 61.0 in | Wt 204.0 lb

## 2016-11-02 DIAGNOSIS — R109 Unspecified abdominal pain: Secondary | ICD-10-CM

## 2016-11-02 DIAGNOSIS — K921 Melena: Secondary | ICD-10-CM

## 2016-11-02 LAB — HEMOCCULT GUIAC POC 1CARD (OFFICE): FECAL OCCULT BLD: NEGATIVE

## 2016-11-02 MED ORDER — AMLODIPINE BESYLATE 10 MG PO TABS: 10.0000 mg | ORAL_TABLET | Freq: Every day | ORAL | 2 refills | Status: DC

## 2016-11-02 NOTE — Patient Instructions (Addendum)
Thank you for coming in.   We will get a few more labs to evaluate your symptoms.    Heart-Healthy Eating Plan Heart-healthy meal planning includes:  Limiting unhealthy fats.  Increasing healthy fats.  Making other small dietary changes.  You may need to talk with your doctor or a diet specialist (dietitian) to create an eating plan that is right for you. What types of fat should I choose?  Choose healthy fats. These include olive oil and canola oil, flaxseeds, walnuts, almonds, and seeds.  Eat more omega-3 fats. These include salmon, mackerel, sardines, tuna, flaxseed oil, and ground flaxseeds. Try to eat fish at least twice each week.  Limit saturated fats. ? Saturated fats are often found in animal products, such as meats, butter, and cream. ? Plant sources of saturated fats include palm oil, palm kernel oil, and coconut oil.  Avoid foods with partially hydrogenated oils in them. These include stick margarine, some tub margarines, cookies, crackers, and other baked goods. These contain trans fats. What general guidelines do I need to follow?  Check food labels carefully. Identify foods with trans fats or high amounts of saturated fat.  Fill one half of your plate with vegetables and green salads. Eat 4-5 servings of vegetables per day. A serving of vegetables is: ? 1 cup of raw leafy vegetables. ?  cup of raw or cooked cut-up vegetables. ?  cup of vegetable juice.  Fill one fourth of your plate with whole grains. Look for the word "whole" as the first word in the ingredient list.  Fill one fourth of your plate with lean protein foods.  Eat 4-5 servings of fruit per day. A serving of fruit is: ? One medium whole fruit. ?  cup of dried fruit. ?  cup of fresh, frozen, or canned fruit. ?  cup of 100% fruit juice.  Eat more foods that contain soluble fiber. These include apples, broccoli, carrots, beans, peas, and barley. Try to get 20-30 g of fiber per day.  Eat  more home-cooked food. Eat less restaurant, buffet, and fast food.  Limit or avoid alcohol.  Limit foods high in starch and sugar.  Avoid fried foods.  Avoid frying your food. Try baking, boiling, grilling, or broiling it instead. You can also reduce fat by: ? Removing the skin from poultry. ? Removing all visible fats from meats. ? Skimming the fat off of stews, soups, and gravies before serving them. ? Steaming vegetables in water or broth.  Lose weight if you are overweight.  Eat 4-5 servings of nuts, legumes, and seeds per week: ? One serving of dried beans or legumes equals  cup after being cooked. ? One serving of nuts equals 1 ounces. ? One serving of seeds equals  ounce or one tablespoon.  You may need to keep track of how much salt or sodium you eat. This is especially true if you have high blood pressure. Talk with your doctor or dietitian to get more information. What foods can I eat? Grains Breads, including [place], white, pita, wheat, raisin, rye, oatmeal, and [place]. Tortillas that are neither fried nor made with lard or trans fat. Low-fat rolls, including hotdog and hamburger buns and English muffins. Biscuits. Muffins. Waffles. Pancakes. Light popcorn. Whole-grain cereals. Flatbread. Melba toast. Pretzels. Breadsticks. Rusks. Low-fat snacks. Low-fat crackers, including oyster, saltine, matzo, graham, animal, and rye. Rice and pasta, including brown rice and pastas that are made with whole wheat. Vegetables All vegetables. Fruits All fruits, but limit coconut.  Meats and Other Protein Sources Lean, well-trimmed beef, veal, pork, and lamb. Chicken and [place] without skin. All fish and shellfish. Wild duck, rabbit, pheasant, and venison. Egg whites or low-cholesterol egg substitutes. Dried beans, peas, lentils, and tofu. Seeds and most nuts. Dairy Low-fat or nonfat cheeses, including ricotta, string, and mozzarella. Skim or 1% milk that is liquid, powdered, or  evaporated. Buttermilk that is made with low-fat milk. Nonfat or low-fat yogurt. Beverages Mineral water. Diet carbonated beverages. Sweets and Desserts Sherbets and fruit ices. Honey, jam, marmalade, jelly, and syrups. Meringues and gelatins. Pure sugar candy, such as hard candy, jelly beans, gumdrops, mints, marshmallows, and small amounts of dark chocolate. [employer] Eat all sweets and desserts in moderation. Fats and Oils Nonhydrogenated (trans-free) margarines. Vegetable oils, including soybean, sesame, sunflower, olive, peanut, safflower, corn, canola, and cottonseed. Salad dressings or mayonnaise made with a vegetable oil. Limit added fats and oils that you use for cooking, baking, salads, and as spreads. Other Cocoa powder. Coffee and tea. All seasonings and condiments. The items listed above may not be a complete list of recommended foods or beverages. Contact your dietitian for more options. What foods are not recommended? Grains Breads that are made with saturated or trans fats, oils, or whole milk. Croissants. Butter rolls. Cheese breads. Sweet rolls. Donuts. Buttered popcorn. Chow mein noodles. High-fat crackers, such as cheese or butter crackers. Meats and Other Protein Sources Fatty meats, such as hotdogs, short ribs, sausage, spareribs, bacon, rib eye roast or steak, and mutton. High-fat deli meats, such as salami and bologna. Caviar. Domestic duck and goose. Organ meats, such as kidney, liver, sweetbreads, and heart. Dairy Cream, sour cream, cream cheese, and creamed cottage cheese. Whole-milk cheeses, including blue (bleu), Monte[place] Jack, [place], [place], American, [place], Swiss, cheddar, [place], and [place]. Whole or 2% milk that is liquid, evaporated, or condensed. Whole buttermilk. Cream sauce or high-fat cheese sauce. Yogurt that is made from whole milk. Beverages Regular sodas and juice drinks with added sugar. Sweets and Desserts Frosting. Pudding. Cookies. Cakes  other than angel food cake. Candy that has milk chocolate or white chocolate, hydrogenated fat, butter, coconut, or unknown ingredients. Buttered syrups. Full-fat ice cream or ice cream drinks. Fats and Oils Gravy that has suet, meat fat, or shortening. Cocoa butter, hydrogenated oils, palm oil, coconut oil, palm kernel oil. These can often be found in baked products, candy, fried foods, nondairy creamers, and whipped toppings. Solid fats and shortenings, including bacon fat, salt pork, lard, and butter. Nondairy cream substitutes, such as coffee creamers and sour cream substitutes. Salad dressings that are made of unknown oils, cheese, or sour cream. The items listed above may not be a complete list of foods and beverages to avoid. Contact your dietitian for more information. This information is not intended to replace advice given to you by your health care provider. Make sure you discuss any questions you have with your health care provider. Document Released: 09/01/2011 Document Revised: 08/08/2015 Document Reviewed: 08/24/2013 Elsevier Interactive Patient Education  [employer].

## 2016-11-03 ENCOUNTER — Telehealth: Payer: Self-pay | Admitting: Internal Medicine

## 2016-11-03 ENCOUNTER — Encounter: Payer: Self-pay | Admitting: Internal Medicine

## 2016-11-03 LAB — COMPREHENSIVE METABOLIC PANEL
ALBUMIN: 4.1 g/dL (ref 3.5–5.5)
ALK PHOS: 69 IU/L (ref 39–117)
ALT: 34 IU/L — AB (ref 0–32)
AST: 34 IU/L (ref 0–40)
Albumin/Globulin Ratio: 1.5 (ref 1.2–2.2)
BUN / CREAT RATIO: 12 (ref 9–23)
BUN: 12 mg/dL (ref 6–24)
Bilirubin Total: 0.5 mg/dL (ref 0.0–1.2)
CHLORIDE: 101 mmol/L (ref 96–106)
CO2: 25 mmol/L (ref 20–29)
CREATININE: 1.04 mg/dL — AB (ref 0.57–1.00)
Calcium: 10 mg/dL (ref 8.7–10.2)
GFR calc Af Amer: 78 mL/min/{1.73_m2} (ref >59)
GFR calc non Af Amer: 67 mL/min/{1.73_m2} (ref >59)
GLUCOSE: 87 mg/dL (ref 65–99)
Globulin, Total: 2.8 g/dL (ref 1.5–4.5)
Potassium: 3.7 mmol/L (ref 3.5–5.2)
Sodium: 142 mmol/L (ref 134–144)
TOTAL PROTEIN: 6.9 g/dL (ref 6.0–8.5)

## 2016-11-03 LAB — CBC
HEMOGLOBIN: 13.9 g/dL (ref 11.1–15.9)
Hematocrit: 40.3 % (ref 34.0–46.6)
MCH: 31.4 pg (ref 26.6–33.0)
MCHC: 34.5 g/dL (ref 31.5–35.7)
MCV: 91 fL (ref 79–97)
PLATELETS: 339 10*3/uL (ref 150–379)
RBC: 4.42 x10E6/uL (ref 3.77–5.28)
RDW: 13.3 % (ref 12.3–15.4)
WBC: 5.8 10*3/uL (ref 3.4–10.8)

## 2016-11-03 LAB — SEDIMENTATION RATE: SED RATE: 4 mm/h (ref 0–32)

## 2016-11-03 LAB — C-REACTIVE PROTEIN: CRP: 2.6 mg/L (ref 0.0–4.9)

## 2016-11-03 NOTE — Telephone Encounter (Signed)
Called patient regarding lab results, which are all unremarkable. Symptoms most consistent with irritable bowel. We discussed options on management. Patient would like to try lifestyle management and not PO medication right now. Will send information via MyChart.

## 2016-11-12 ENCOUNTER — Encounter: Payer: Self-pay | Admitting: Family Medicine

## 2016-11-12 ENCOUNTER — Ambulatory Visit (INDEPENDENT_AMBULATORY_CARE_PROVIDER_SITE_OTHER): Payer: 59 | Admitting: Family Medicine

## 2016-11-12 VITALS — BP 116/88 | HR 100 | Temp 98.4°F | Ht 61.0 in | Wt 204.8 lb

## 2016-11-12 DIAGNOSIS — B86 Scabies: Secondary | ICD-10-CM | POA: Insufficient documentation

## 2016-11-12 MED ORDER — PERMETHRIN 5 % EX CREA: 1.0000 "application " | TOPICAL_CREAM | Freq: Once | CUTANEOUS | 0 refills | Status: AC

## 2016-11-12 NOTE — Progress Notes (Signed)
   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 We think he was treated. Noticed it yesterday, woke her up with itching this morning. Tried sarna, helped a little. Never had scabies before, no hx of skin conditions. Patient complains of rash involving the right inner wrist, upper mid back, and bilateral legs. She has not tried any medicines for the itching. Very anxious about this.   ROS: denies CP, SOB, abd pain, changes in bowel or bladder.    CC, SH/smoking status, and VS noted  Objective: BP 116/88   Pulse 100   Temp 98.4 F (36.9 C) (Oral)   Ht 5\' 1"  (1.549 m)   Wt 204 lb 12.8 oz (92.9 kg)   LMP 09/20/2015 (Exact Date)   SpO2 98%   BMI 38.70 kg/m  Gen: NAD, alert, cooperative, and pleasant. HEENT: NCAT, EOMI, PERRL Skin: 4 pinpoint excorations with erythematous border over right flexor wrist surface. No additional rashes found. Ext: No edema, warm Neuro: Alert and oriented, Speech clear, No gross deficits  Assessment and plan:  Scabies infestation Exposed to child with scabies at work. Rash over inner right wrist consistent with scabies. Given permethrin cream and instructions on how to apply. RTC if worsening or not improving. Recommended benadryl for itching. Suspect some of the itching is due to anxiety.    Meds ordered this encounter  Medications  . permethrin (ELIMITE) 5 % cream    Sig: Apply 1 application topically once. Apply from head to toe once before bed. Wash sheets the next day in hot water.    Dispense:  60 g    Refill:  0     Ralene Ok, MD, PGY2 11/12/2016 11:42 AM

## 2016-11-12 NOTE — Patient Instructions (Addendum)
It was a pleasure to see you today! Thank you for choosing [facility] for your primary care. Wendy Duffy was seen for rash.   Our plans for today were:  Apply the insecticide cream tonight from head to toe before bed. DO NOT wash it off until morning. In the morning, wash the cream off and wash your sheets and clothes in hot water.   For the itching, you can take over the counter benadryl.   Best,  Dr. Lindell Noe

## 2016-11-12 NOTE — Assessment & Plan Note (Signed)
Exposed to child with scabies at work. Rash over inner right wrist consistent with scabies. Given permethrin cream and instructions on how to apply. RTC if worsening or not improving. Recommended benadryl for itching. Suspect some of the itching is due to anxiety.

## 2017-01-20 ENCOUNTER — Other Ambulatory Visit: Payer: Self-pay | Admitting: Obstetrics and Gynecology

## 2017-01-21 ENCOUNTER — Ambulatory Visit (HOSPITAL_COMMUNITY): Payer: Self-pay | Admitting: Psychiatry

## 2017-02-15 ENCOUNTER — Telehealth: Payer: Self-pay | Admitting: *Deleted

## 2017-02-15 NOTE — Telephone Encounter (Signed)
Pt lm on nurse line stating that all her BP meds have been recalled.  Wants to know what to do next?  Will forward to MD. [place], Salome Spotted, [place]

## 2017-02-16 NOTE — Telephone Encounter (Signed)
Attempted to call cell number. Voicemail is full.  Called work number. Discussed that the recall is only for a certain lot number according to what I read. I think her medications are fine but to double check I asked her to call the pharmacy to verify

## 2017-02-20 ENCOUNTER — Other Ambulatory Visit: Payer: Self-pay | Admitting: Obstetrics and Gynecology

## 2017-03-24 ENCOUNTER — Other Ambulatory Visit: Payer: Self-pay

## 2017-03-24 ENCOUNTER — Ambulatory Visit (INDEPENDENT_AMBULATORY_CARE_PROVIDER_SITE_OTHER): Payer: 59 | Admitting: Family Medicine

## 2017-03-24 ENCOUNTER — Encounter: Payer: Self-pay | Admitting: Family Medicine

## 2017-03-24 DIAGNOSIS — J069 Acute upper respiratory infection, unspecified: Secondary | ICD-10-CM | POA: Diagnosis not present

## 2017-03-24 DIAGNOSIS — R05 Cough: Secondary | ICD-10-CM | POA: Diagnosis not present

## 2017-03-24 DIAGNOSIS — R059 Cough, unspecified: Secondary | ICD-10-CM | POA: Insufficient documentation

## 2017-03-24 DIAGNOSIS — B9789 Other viral agents as the cause of diseases classified elsewhere: Secondary | ICD-10-CM

## 2017-03-24 MED ORDER — BENZONATATE 100 MG PO CAPS: 100.0000 mg | ORAL_CAPSULE | Freq: Two times a day (BID) | ORAL | 0 refills | Status: DC | PRN

## 2017-03-24 NOTE — Patient Instructions (Signed)
Good to see you today!  Thanks for coming in.  For the Cough - humidify the air - nasal saline as often as you like - Robitussin cough syryp - Tessalon perles - Rx - Afrin nasal spray twice a day for 3 days only   If not better in 7-10 days or are still coughing in 6 weeks then you need to come back  If you have high fever or shortness of breath then be seen immediately

## 2017-03-24 NOTE — Assessment & Plan Note (Signed)
New.  Cough seems most consistent with PND from viral uri.  See after visit summary for therapies

## 2017-03-24 NOTE — Progress Notes (Signed)
Subjective  Patient is presenting with the following illnesses  COUGH  Has been coughing for 2 days. Cough is: dry hacking unrelenting Sputum production: no Medications tried: none Taking blood pressure medications: no Patient believes might be causing their cough: bronchitis   Symptoms Runny nose: yes and congested Mucous in back of throat: yes Throat burning or reflux: no Wheezing or asthma: no Fever: no Chest Pain: no Shortness of breath: no Leg swelling: no Hemoptysis: no Weight loss: no  ROS see HPI Smoking Status noted    Chief Complaint noted Review of Symptoms - see HPI PMH - Smoking status noted.     Objective Vital Signs reviewed Frequent cough Heart - Regular rate and rhythm.  No murmurs, gallops or rubs.    Lungs:  Normal respiratory effort, chest expands symmetrically. Lungs are clear to auscultation, no crackles or wheezes. Neck:  No deformities, thyromegaly, masses, or tenderness noted.   Supple with full range of motion without pain. Throat: normal mucosa, no exudate, uvula midline, no redness Skin:  Intact without suspicious lesions or rashes Nose - clear discharge with swollen turbinates     Assessments/Plans  Viral URI with cough New.  Cough seems most consistent with PND from viral uri.  See after visit summary for therapies    See after visit summary for details of patient instuctions

## 2017-03-25 ENCOUNTER — Encounter (HOSPITAL_COMMUNITY): Payer: Self-pay | Admitting: Psychiatry

## 2017-03-25 ENCOUNTER — Ambulatory Visit (INDEPENDENT_AMBULATORY_CARE_PROVIDER_SITE_OTHER): Payer: 59 | Admitting: Psychiatry

## 2017-03-25 VITALS — BP 132/80 | HR 92 | Ht 61.0 in | Wt 205.4 lb

## 2017-03-25 DIAGNOSIS — M549 Dorsalgia, unspecified: Secondary | ICD-10-CM

## 2017-03-25 DIAGNOSIS — Z811 Family history of alcohol abuse and dependence: Secondary | ICD-10-CM | POA: Diagnosis not present

## 2017-03-25 DIAGNOSIS — F99 Mental disorder, not otherwise specified: Secondary | ICD-10-CM | POA: Diagnosis not present

## 2017-03-25 DIAGNOSIS — F411 Generalized anxiety disorder: Secondary | ICD-10-CM

## 2017-03-25 DIAGNOSIS — F422 Mixed obsessional thoughts and acts: Secondary | ICD-10-CM

## 2017-03-25 DIAGNOSIS — Z813 Family history of other psychoactive substance abuse and dependence: Secondary | ICD-10-CM

## 2017-03-25 DIAGNOSIS — F1099 Alcohol use, unspecified with unspecified alcohol-induced disorder: Secondary | ICD-10-CM | POA: Diagnosis not present

## 2017-03-25 DIAGNOSIS — F5105 Insomnia due to other mental disorder: Secondary | ICD-10-CM

## 2017-03-25 DIAGNOSIS — F331 Major depressive disorder, recurrent, moderate: Secondary | ICD-10-CM

## 2017-03-25 MED ORDER — VENLAFAXINE HCL ER 150 MG PO CP24: 150.0000 mg | ORAL_CAPSULE | Freq: Every day | ORAL | 0 refills | Status: DC

## 2017-03-25 MED ORDER — BUPROPION HCL ER (XL) 150 MG PO TB24: 150.0000 mg | ORAL_TABLET | Freq: Every day | ORAL | 0 refills | Status: DC

## 2017-03-25 MED ORDER — QUETIAPINE FUMARATE 100 MG PO TABS: 150.0000 mg | ORAL_TABLET | Freq: Every day | ORAL | 0 refills | Status: DC

## 2017-03-25 NOTE — Progress Notes (Signed)
[place] MD/PA/NP OP Progress Note  03/25/2017 11:15 AM Wendy Duffy  MRN:  [id number]  Chief Complaint:  Chief Complaint    Follow-up; Depression     HPI: Pt last seen July 2018.   "Well I became a grandma. It was unexpected. He is almost 2 months and I see him every weekend".  Pt states her depression has been going well. She has some in late October due to stressors. During that time she was irritable. Pt go thru the holidays very well. Pt does not feel depressed now. Pt denies isolation, crying spells, anhedonia and hopelessness. Pt denies SI/HI/AVH.  Sleep is poor. She goes to bed at 8am-wakes up 3am. She is awake until 4am and then will sleep for another 3 hrs. Pt states she is taking Seroquel at 6:30pm and if she takes it later it doesn't work. Energy is ok. Appetite is good and she is eating healthier to lose weight.  Pt doesn't feel anxiety during the day. She feels anxious after waking in the middle of the night. She will eat and then get on the computer and read. She feels restless and racing thoughts at that time.  Pt states-taking meds as prescribed and denies SE. Pt feels she is in a good place, better than she has been in years.   Visit Diagnosis:    ICD-10-CM   1. Insomnia due to other mental disorder F51.05 QUEtiapine (SEROQUEL) 100 MG tablet   F99   2. MDD (major depressive disorder), recurrent episode, moderate (HCC) F33.1 buPROPion (WELLBUTRIN XL) 150 MG 24 hr tablet    venlafaxine XR (EFFEXOR-XR) 150 MG 24 hr capsule    QUEtiapine (SEROQUEL) 100 MG tablet  3. GAD (generalized anxiety disorder) F41.1 venlafaxine XR (EFFEXOR-XR) 150 MG 24 hr capsule    QUEtiapine (SEROQUEL) 100 MG tablet  4. Mixed obsessional thoughts and acts F42.2 venlafaxine XR (EFFEXOR-XR) 150 MG 24 hr capsule      Past Psychiatric History:  Anxiety: Yes Bipolar Disorder: No Depression: Yes Mania: No Psychosis: No Schizophrenia: No Personality Disorder: No Hospitalization for psychiatric  illness: No History of Electroconvulsive Shock Therapy: No Prior Suicide Attempts: Yes   Past Medical History:  Past Medical History:  Diagnosis Date  . Anemia   . Anxiety   . Depression   . Gastritis   . Hemorrhoids   . Hypertension   . Sleep apnea    questionable refered for sleep study will schedule appointment   . Status post laparoscopic hysterectomy 10/21/2015   TLH with BSO    Past Surgical History:  Procedure Laterality Date  . BILATERAL SALPINGECTOMY Bilateral 10/21/2015   Procedure: BILATERAL SALPINGECTOMY;  Surgeon: Sherlyn Hay, DO;  Location: [facility];  Service: Gynecology;  Laterality: Bilateral;  . [place], 2003  . LAPAROSCOPIC HYSTERECTOMY N/A 10/21/2015   Procedure: HYSTERECTOMY TOTAL LAPAROSCOPIC;  Surgeon: Sherlyn Hay, DO;  Location: [facility];  Service: Gynecology;  Laterality: N/A;  . WISDOM TOOTH EXTRACTION      Family Psychiatric History:  Family History  Problem Relation Age of Onset  . Alcohol abuse Mother   . Drug abuse Maternal Uncle   . Alcohol abuse Maternal Grandmother   . Drug abuse Maternal Grandmother     Social History:  Social History   Socioeconomic History  . Marital status: Married    Spouse name: None  . Number of children: None  . Years of education: None  . Highest education level: None  Social Needs  .  Financial resource strain: None  . Food insecurity - worry: None  . Food insecurity - inability: None  . Transportation needs - medical: None  . Transportation needs - non-medical: None  Occupational History  . Occupation: Print production planner  Tobacco Use  . Smoking status: Never Smoker  . Smokeless tobacco: Never Used  Substance and Sexual Activity  . Alcohol use: Yes    Alcohol/week: 1.8 oz    Types: 1 Glasses of wine, 2 Cans of beer per week    Comment: weekly  . Drug use: No    Comment: last used THC in November 2015. using a few times a year  . Sexual activity: Yes    Birth  control/protection: None  Other Topics Concern  . None  Social History Narrative  . None    Allergies:  Allergies  Allergen Reactions  . Zoloft [Sertraline Hcl]     Made her feel very anxious     Metabolic Disorder Labs: Lab Results  Component Value Date   HGBA1C 4.4 09/17/2016   MPG 80 09/17/2016   MPG 108 05/23/2015   Lab Results  Component Value Date   PROLACTIN 6.8 09/17/2016   PROLACTIN 5.7 05/23/2015   Lab Results  Component Value Date   CHOL 148 09/17/2016   TRIG 121 09/17/2016   HDL 86 09/17/2016   CHOLHDL 1.7 09/17/2016   VLDL 24 09/17/2016   LDLCALC 38 09/17/2016   LDLCALC 61 05/23/2015   Lab Results  Component Value Date   TSH 0.58 09/17/2016   TSH 0.82 05/23/2015    Therapeutic Level Labs: No results found for: LITHIUM No results found for: VALPROATE No components found for:  CBMZ  Current Medications: Current Outpatient Medications  Medication Sig Dispense Refill  . amLODipine (NORVASC) 10 MG tablet Take 1 tablet (10 mg total) by mouth daily. 60 tablet 2  . benzonatate (TESSALON) 100 MG capsule Take 1 capsule (100 mg total) by mouth 2 (two) times daily as needed for cough. 20 capsule 0  . buPROPion (WELLBUTRIN XL) 150 MG 24 hr tablet Take 1 tablet (150 mg total) by mouth daily. 90 tablet 0  . cetirizine (ZYRTEC) 10 MG tablet Take 1 tablet (10 mg total) by mouth daily as needed for allergies. 30 tablet 2  . fluticasone (FLONASE) 50 MCG/ACT nasal spray Place 2 sprays into both nostrils daily. 16 g 6  . hydrochlorothiazide (HYDRODIURIL) 25 MG tablet TAKE ONE TABLET BY MOUTH DAILY 30 tablet 1  . ibuprofen (ADVIL,MOTRIN) 800 MG tablet Take 1 tablet (800 mg total) by mouth every 6 (six) hours as needed for mild pain, moderate pain or cramping. 40 tablet 1  . losartan (COZAAR) 100 MG tablet TAKE ONE TABLET BY MOUTH DAILY 30 tablet 0  . omeprazole (PRILOSEC) 40 MG capsule take 1 capsule by mouth once daily 90 capsule 6  . QUEtiapine (SEROQUEL) 100 MG  tablet Take 1.5 tablets (150 mg total) by mouth at bedtime. 135 tablet 0  . venlafaxine XR (EFFEXOR-XR) 150 MG 24 hr capsule Take 1 capsule (150 mg total) by mouth daily. 90 capsule 0   No current facility-administered medications for this visit.      Musculoskeletal: Strength & Muscle Tone: within normal limits Gait & Station: normal Patient leans: N/A  Psychiatric Specialty Exam: Review of Systems  Musculoskeletal: Positive for back pain. Negative for joint pain, myalgias and neck pain.  Neurological: Negative for dizziness, tingling, tremors and headaches.    Blood pressure 132/80, pulse 92, height 5'  1" (1.549 m), weight 205 lb 6.4 oz (93.2 kg), last menstrual period 09/20/2015.Body mass index is 38.81 kg/m.  General Appearance: Casual  Eye Contact:  Good  Speech:  Clear and Coherent and Normal Rate  Volume:  Normal  Mood:  Euthymic  Affect:  Full Range  Thought Process:  Goal Directed and Descriptions of Associations: Intact  Orientation:  Full (Time, Place, and Person)  Thought Content: Logical   Suicidal Thoughts:  No  Homicidal Thoughts:  No  Memory:  Immediate;   Good Recent;   Good Remote;   Good  Judgement:  Good  Insight:  Good  Psychomotor Activity:  Normal  Concentration:  Concentration: Good and Attention Span: Good  Recall:  Good  Fund of Knowledge: Good  Language: Good  Akathisia:  No  Handed:  Right  AIMS (if indicated): not done  Assets:  Communication Skills Desire for Improvement Financial Resources/Insurance Housing Leisure Time Social Support Transportation Vocational/Educational  ADL's:  Intact  Cognition: WNL  Sleep:  Poor   Screenings: PHQ2-9     Office Visit from 03/24/2017 in [place] Office Visit from 11/12/2016 in [place] Office Visit from 08/20/2016 in [place] Office Visit from 07/20/2016 in [place] Office Visit from 03/30/2016 in [place]  PHQ-2 Total Score  0  0  0  0  0       Assessment and Plan: MDD-recurrent, moderate- improving; GAD-worsening at night; Insomnia-worsening;  r/o OCD    Medication management with supportive therapy. Risks/benefits and SE of the medication discussed. Pt verbalized understanding and verbal consent obtained for treatment.  Affirm with the patient that the medications are taken as ordered. Patient expressed understanding of how their medications were to be used.   Meds: Effexor XR 75 mg p.o. daily for MDD and GAD Increase Seroquel 150 mg p.o. nightly for MDD, GAD and insomnia Wellbutrin XL 150 mg p.o. daily for MDD and sexual side effects related to SNRI use   Labs: Reviewed labs done 11/02/2016 with creatinine of 1.04, CBC within normal limits Labs done 09/17/2016 show prolactin 6.8, hemoglobin 4.4, TSH within normal limits  Therapy: brief supportive therapy provided. Discussed psychosocial stressors in detail.     Consultations: pt no longer in therapy Encouraged to follow up with PCP as needed  Pt denies SI and is at an acute low risk for suicide. Patient told to call clinic if any problems occur. Patient advised to go to ER if they should develop SI/HI, side effects, or if symptoms worsen. Has crisis numbers to call if needed. Pt verbalized understanding.  F/up in 2 months or sooner if needed    [name], MD 03/25/2017, 11:15 AM

## 2017-04-13 ENCOUNTER — Other Ambulatory Visit: Payer: Self-pay | Admitting: Internal Medicine

## 2017-04-16 ENCOUNTER — Other Ambulatory Visit: Payer: Self-pay | Admitting: Internal Medicine

## 2017-04-16 NOTE — Telephone Encounter (Signed)
Please have patient make a follow up appointment.

## 2017-05-14 ENCOUNTER — Other Ambulatory Visit: Payer: Self-pay | Admitting: Internal Medicine

## 2017-05-17 ENCOUNTER — Other Ambulatory Visit: Payer: Self-pay | Admitting: Internal Medicine

## 2017-05-27 ENCOUNTER — Ambulatory Visit (HOSPITAL_COMMUNITY): Payer: Self-pay | Admitting: Psychiatry

## 2017-06-11 IMAGING — CR DG CHEST 2V
2 series · 2 of 2 positions shown · non-contrast
Comparison: Two-view chest x-ray 08/03/2013.

CLINICAL DATA: New onset left-sided chest pain.

EXAM:
CHEST  2 VIEW

[w chest pa]
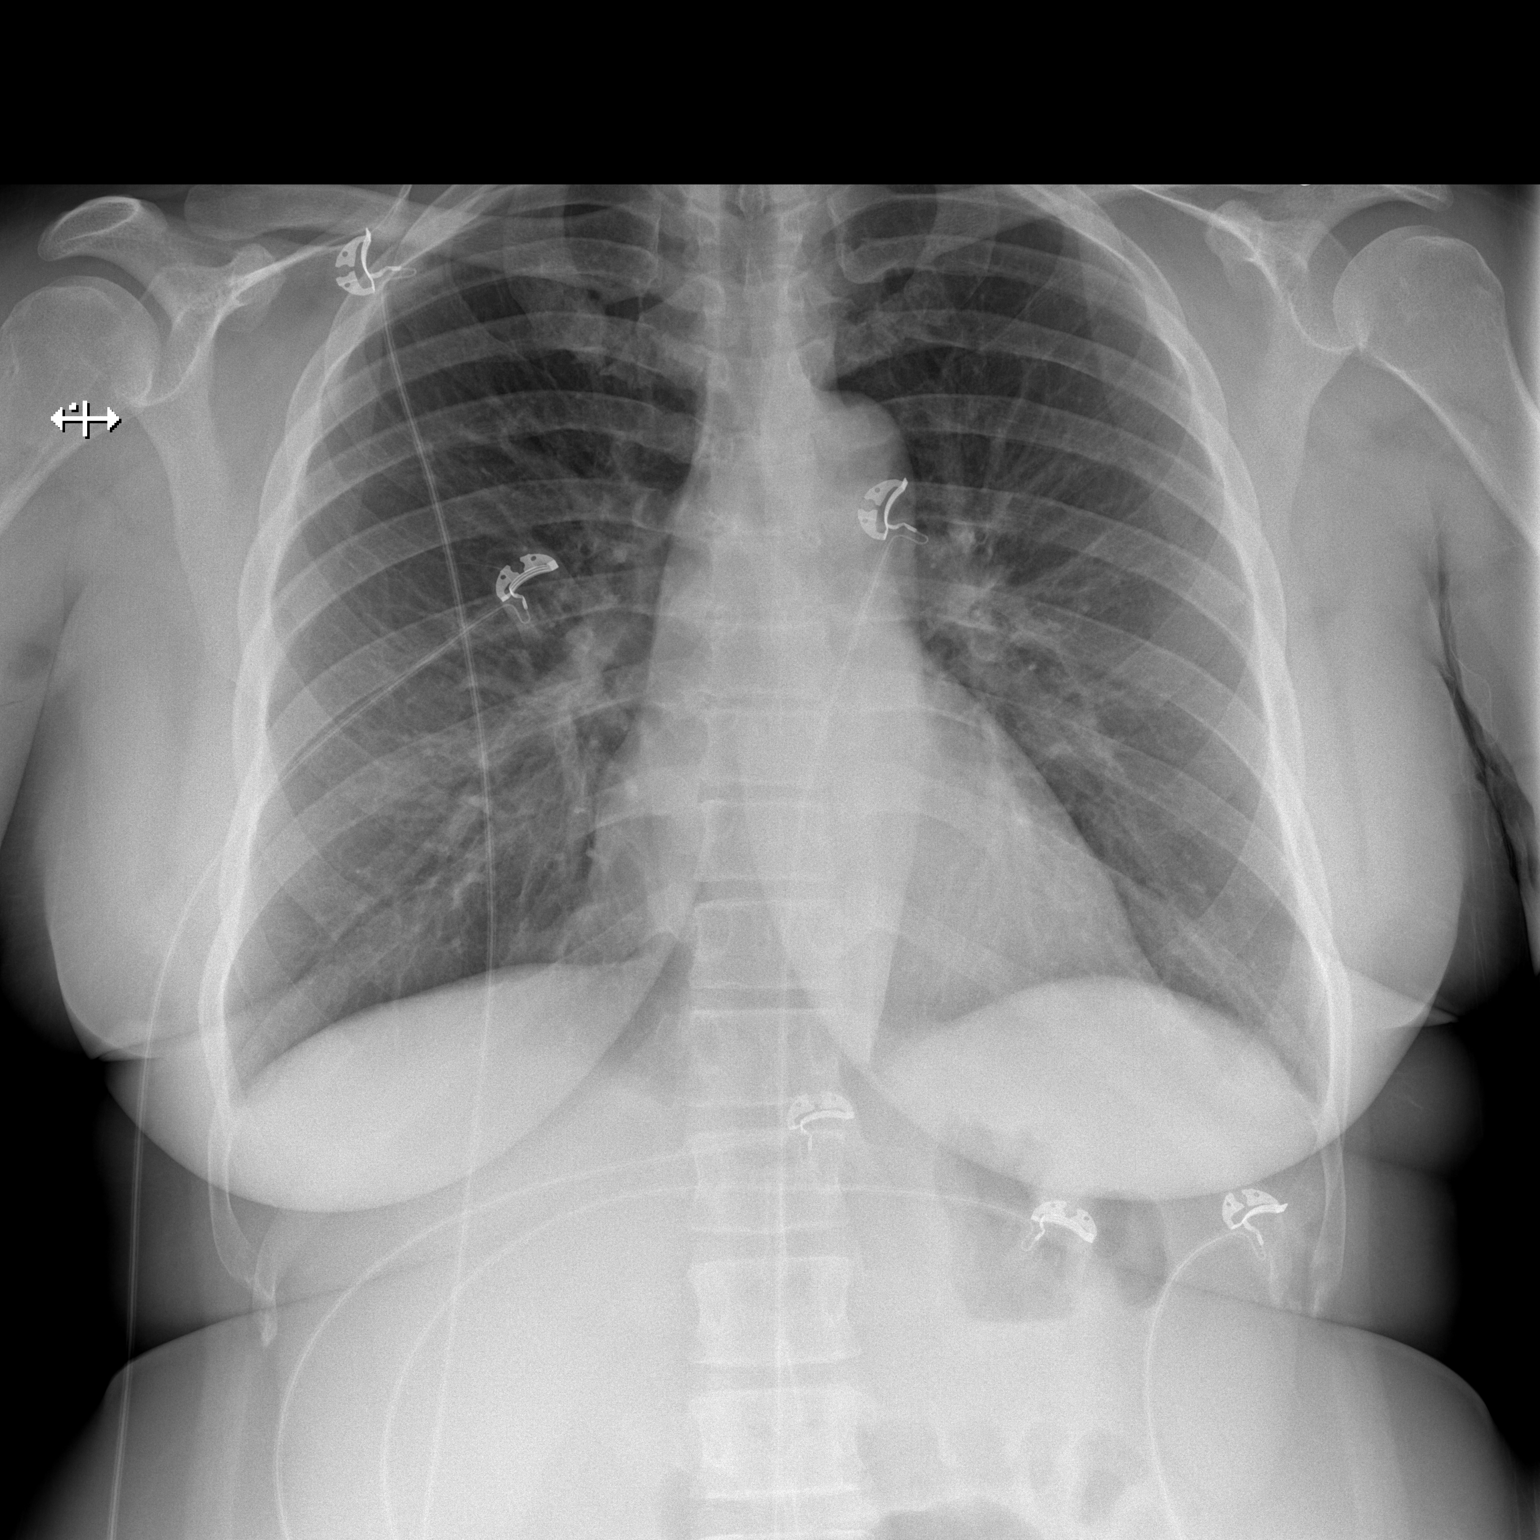

[w chest lat]
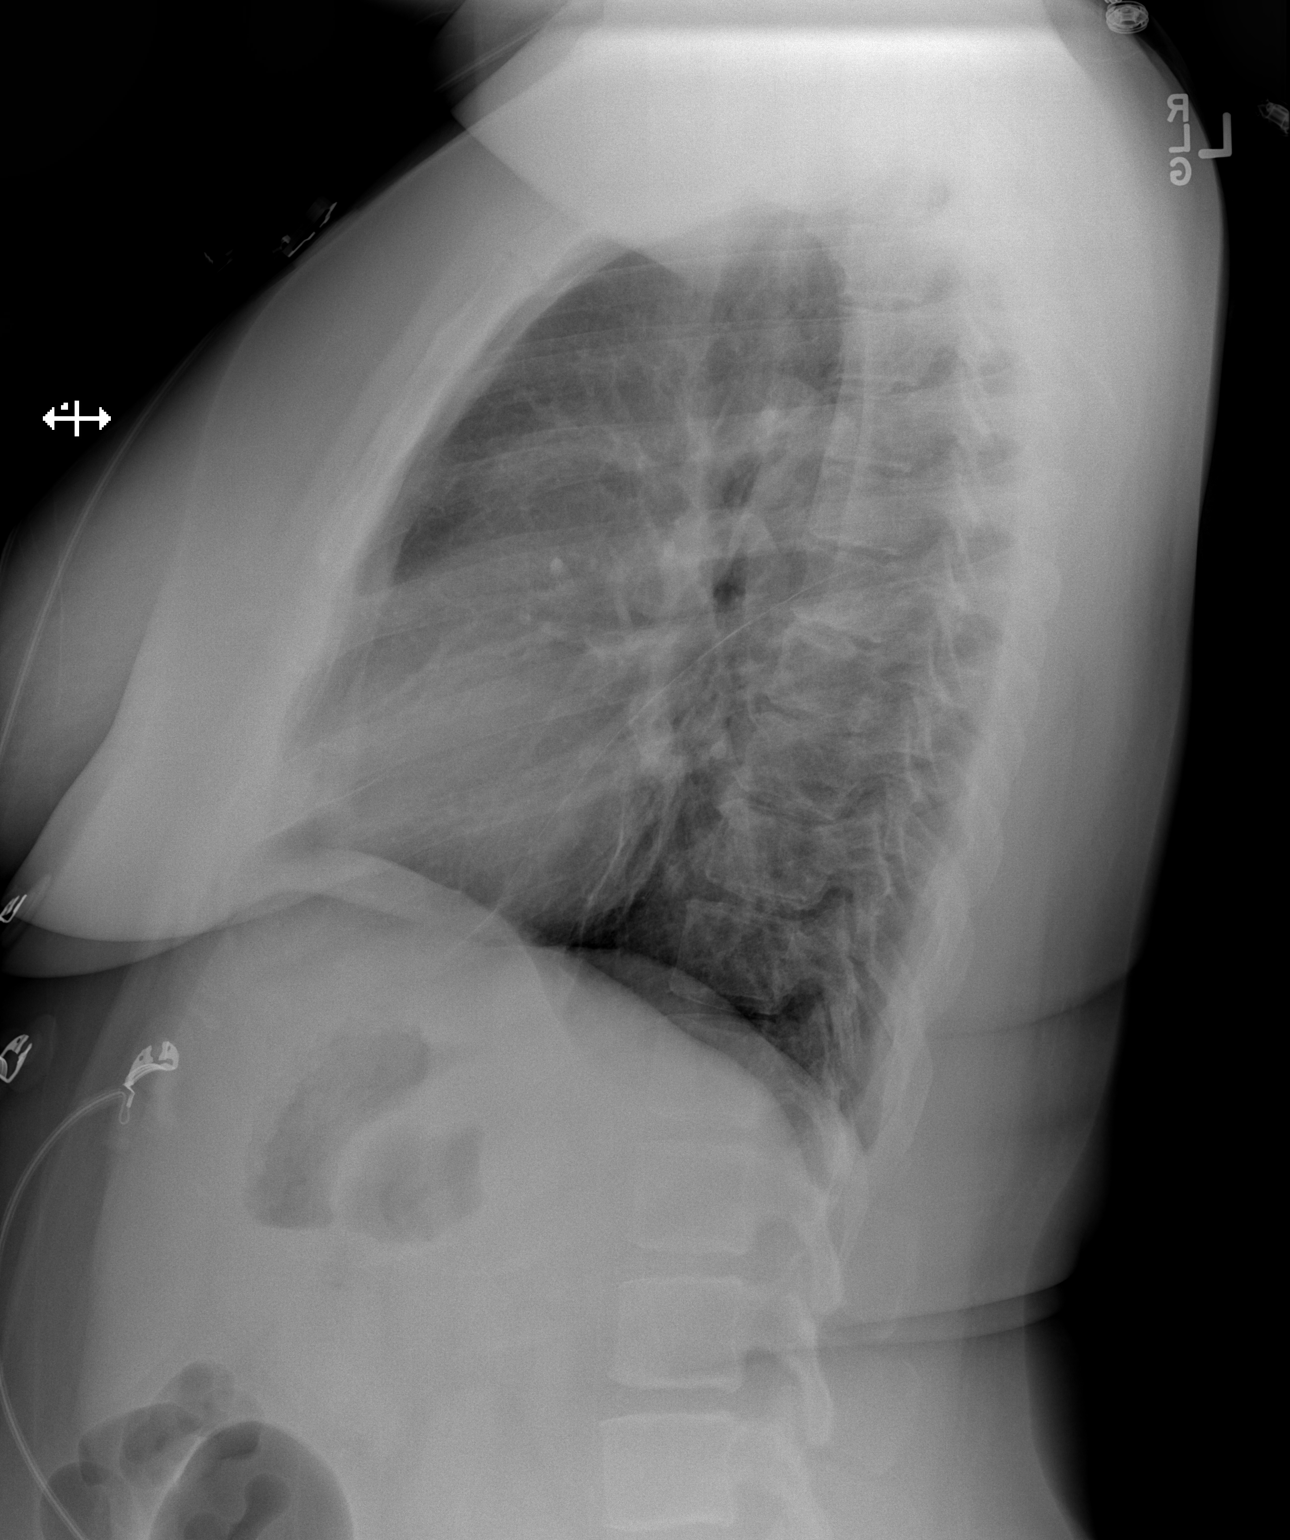

[2 of 2 positions shown; findings below may reference images not displayed]

FINDINGS: The heart size is normal. Minimal left basilar airspace disease is
present. The right lung is clear. There is no edema or effusion. The
upper lung fields are clear bilaterally. The visualized soft tissues
and bony thorax are unremarkable.
IMPRESSION: 1. Ill-defined left lower lobe airspace disease. While this may
represent atelectasis, early infection is also considered.

## 2017-06-12 ENCOUNTER — Other Ambulatory Visit: Payer: Self-pay | Admitting: Internal Medicine

## 2017-06-14 NOTE — Telephone Encounter (Signed)
Please have patient make a follow up visit

## 2017-06-14 NOTE — Telephone Encounter (Signed)
Tried calling patient but there was no answer or voicemail.  Will try again later today. Jazmin Hartsell,CMA

## 2017-07-01 ENCOUNTER — Encounter (HOSPITAL_COMMUNITY): Payer: Self-pay | Admitting: Psychiatry

## 2017-07-01 ENCOUNTER — Ambulatory Visit (INDEPENDENT_AMBULATORY_CARE_PROVIDER_SITE_OTHER): Payer: 59 | Admitting: Psychiatry

## 2017-07-01 DIAGNOSIS — F99 Mental disorder, not otherwise specified: Secondary | ICD-10-CM | POA: Diagnosis not present

## 2017-07-01 DIAGNOSIS — F422 Mixed obsessional thoughts and acts: Secondary | ICD-10-CM

## 2017-07-01 DIAGNOSIS — F331 Major depressive disorder, recurrent, moderate: Secondary | ICD-10-CM | POA: Diagnosis not present

## 2017-07-01 DIAGNOSIS — F5105 Insomnia due to other mental disorder: Secondary | ICD-10-CM | POA: Diagnosis not present

## 2017-07-01 DIAGNOSIS — F411 Generalized anxiety disorder: Secondary | ICD-10-CM

## 2017-07-01 DIAGNOSIS — Z813 Family history of other psychoactive substance abuse and dependence: Secondary | ICD-10-CM | POA: Diagnosis not present

## 2017-07-01 DIAGNOSIS — Z811 Family history of alcohol abuse and dependence: Secondary | ICD-10-CM | POA: Diagnosis not present

## 2017-07-01 MED ORDER — QUETIAPINE FUMARATE 100 MG PO TABS: 150.0000 mg | ORAL_TABLET | Freq: Every day | ORAL | 0 refills | Status: DC

## 2017-07-01 MED ORDER — VENLAFAXINE HCL ER 150 MG PO CP24: 150.0000 mg | ORAL_CAPSULE | Freq: Every day | ORAL | 0 refills | Status: DC

## 2017-07-01 MED ORDER — BUPROPION HCL ER (XL) 150 MG PO TB24: 150.0000 mg | ORAL_TABLET | Freq: Every day | ORAL | 0 refills | Status: DC

## 2017-07-01 NOTE — Progress Notes (Signed)
[facility] MD/PA/NP OP Progress Note  07/01/2017 2:31 PM Wendy Duffy  MRN:  [id number]  Chief Complaint:  Chief Complaint    Follow-up     HPI: Patient states that she is doing really well and all of her stressors are "stable right now".  She states that she cannot recall the last time that she felt depressed or sad.  She cannot recall the last time that she was feeling anxious or overwhelmed.  Sleep is improved since Seroquel dose was increased.  Patient states that she feels very happy.  Her only concern is her husband's mental health and she is seeking help for him.  She denies SI/HI.  Patient is very happy to be doing so well right now.  She is taking medications as prescribed and denies side effects  Visit Diagnosis:    ICD-10-CM   1. MDD (major depressive disorder), recurrent episode, moderate (HCC) F33.1 buPROPion (WELLBUTRIN XL) 150 MG 24 hr tablet    QUEtiapine (SEROQUEL) 100 MG tablet    venlafaxine XR (EFFEXOR-XR) 150 MG 24 hr capsule  2. GAD (generalized anxiety disorder) F41.1 QUEtiapine (SEROQUEL) 100 MG tablet    venlafaxine XR (EFFEXOR-XR) 150 MG 24 hr capsule  3. Insomnia due to other mental disorder F51.05 QUEtiapine (SEROQUEL) 100 MG tablet   F99   4. Mixed obsessional thoughts and acts F42.2 venlafaxine XR (EFFEXOR-XR) 150 MG 24 hr capsule    Past Psychiatric History:  Anxiety: Yes Bipolar Disorder: No Depression: Yes Mania: No Psychosis: No Schizophrenia: No Personality Disorder: No Hospitalization for psychiatric illness: No History of Electroconvulsive Shock Therapy: No Prior Suicide Attempts: Yes   Past Medical History:  Past Medical History:  Diagnosis Date  . Anemia   . Anxiety   . Depression   . Gastritis   . Hemorrhoids   . Hypertension   . Sleep apnea    questionable refered for sleep study will schedule appointment   . Status post laparoscopic hysterectomy 10/21/2015   TLH with BSO    Past Surgical History:  Procedure Laterality Date  .  BILATERAL SALPINGECTOMY Bilateral 10/21/2015   Procedure: BILATERAL SALPINGECTOMY;  Surgeon: Sherlyn Hay, DO;  Location: [facility];  Service: Gynecology;  Laterality: Bilateral;  . [place], 2003  . LAPAROSCOPIC HYSTERECTOMY N/A 10/21/2015   Procedure: HYSTERECTOMY TOTAL LAPAROSCOPIC;  Surgeon: Sherlyn Hay, DO;  Location: [facility];  Service: Gynecology;  Laterality: N/A;  . WISDOM TOOTH EXTRACTION      Family Psychiatric History:  Family History  Problem Relation Age of Onset  . Alcohol abuse Mother   . Drug abuse Maternal Uncle   . Alcohol abuse Maternal Grandmother   . Drug abuse Maternal Grandmother     Social History:  Social History   Socioeconomic History  . Marital status: Married    Spouse name: Not on file  . Number of children: Not on file  . Years of education: Not on file  . Highest education level: Not on file  Occupational History  . Occupation: Print production planner  Social Needs  . Financial resource strain: Not on file  . Food insecurity:    Worry: Not on file    Inability: Not on file  . Transportation needs:    Medical: Not on file    Non-medical: Not on file  Tobacco Use  . Smoking status: Never Smoker  . Smokeless tobacco: Never Used  Substance and Sexual Activity  . Alcohol use: Yes    Alcohol/week: 1.8 oz  Types: 1 Glasses of wine, 2 Cans of beer per week    Comment: weekly  . Drug use: No    Types: Marijuana    Comment: last used THC in November 2015. using a few times a year  . Sexual activity: Yes    Birth control/protection: None  Lifestyle  . Physical activity:    Days per week: Not on file    Minutes per session: Not on file  . Stress: Not on file  Relationships  . Social connections:    Talks on phone: Not on file    Gets together: Not on file    Attends religious service: Not on file    Active member of club or organization: Not on file    Attends meetings of clubs or organizations: Not on file     Relationship status: Not on file  Other Topics Concern  . Not on file  Social History Narrative  . Not on file    Allergies:  Allergies  Allergen Reactions  . Zoloft [Sertraline Hcl]     Made her feel very anxious     Metabolic Disorder Labs: Lab Results  Component Value Date   HGBA1C 4.4 09/17/2016   MPG 80 09/17/2016   MPG 108 05/23/2015   Lab Results  Component Value Date   PROLACTIN 6.8 09/17/2016   PROLACTIN 5.7 05/23/2015   Lab Results  Component Value Date   CHOL 148 09/17/2016   TRIG 121 09/17/2016   HDL 86 09/17/2016   CHOLHDL 1.7 09/17/2016   VLDL 24 09/17/2016   LDLCALC 38 09/17/2016   LDLCALC 61 05/23/2015   Lab Results  Component Value Date   TSH 0.58 09/17/2016   TSH 0.82 05/23/2015    Therapeutic Level Labs: No results found for: LITHIUM No results found for: VALPROATE No components found for:  CBMZ  Current Medications: Current Outpatient Medications  Medication Sig Dispense Refill  . amLODipine (NORVASC) 10 MG tablet TAKE ONE TABLET BY MOUTH DAILY 30 tablet 1  . benzonatate (TESSALON) 100 MG capsule Take 1 capsule (100 mg total) by mouth 2 (two) times daily as needed for cough. 20 capsule 0  . buPROPion (WELLBUTRIN XL) 150 MG 24 hr tablet Take 1 tablet (150 mg total) by mouth daily. 90 tablet 0  . cetirizine (ZYRTEC) 10 MG tablet Take 1 tablet (10 mg total) by mouth daily as needed for allergies. 30 tablet 2  . fluticasone (FLONASE) 50 MCG/ACT nasal spray Place 2 sprays into both nostrils daily. 16 g 6  . hydrochlorothiazide (HYDRODIURIL) 25 MG tablet TAKE ONE TABLET BY MOUTH DAILY 30 tablet 3  . ibuprofen (ADVIL,MOTRIN) 800 MG tablet Take 1 tablet (800 mg total) by mouth every 6 (six) hours as needed for mild pain, moderate pain or cramping. 40 tablet 1  . losartan (COZAAR) 100 MG tablet TAKE ONE TABLET BY MOUTH DAILY 30 tablet 0  . omeprazole (PRILOSEC) 40 MG capsule take 1 capsule by mouth once daily 90 capsule 6  . QUEtiapine (SEROQUEL)  100 MG tablet Take 1.5 tablets (150 mg total) by mouth at bedtime. 135 tablet 0  . venlafaxine XR (EFFEXOR-XR) 150 MG 24 hr capsule Take 1 capsule (150 mg total) by mouth daily. 90 capsule 0   No current facility-administered medications for this visit.      Musculoskeletal: Strength & Muscle Tone: within normal limits Gait & Station: normal Patient leans: N/A  Psychiatric Specialty Exam: Review of Systems  Constitutional: Negative for chills, diaphoresis and  fever.  Psychiatric/Behavioral: Negative for depression, hallucinations, substance abuse and suicidal ideas. The patient is not nervous/anxious and does not have insomnia.     Blood pressure 115/81, pulse 98, height 5\' 1"  (1.549 m), weight 203 lb (92.1 kg), last menstrual period 09/20/2015, SpO2 97 %.Body mass index is 38.36 kg/m.  General Appearance: Casual  Eye Contact:  Good  Speech:  Clear and Coherent and Normal Rate  Volume:  Normal  Mood:  Euthymic  Affect:  Full Range  Thought Process:  Goal Directed and Descriptions of Associations: Intact  Orientation:  Full (Time, Place, and Person)  Thought Content: Logical   Suicidal Thoughts:  No  Homicidal Thoughts:  No  Memory:  Immediate;   Good Recent;   Good Remote;   Good  Judgement:  Good  Insight:  Good  Psychomotor Activity:  Normal  Concentration:  Concentration: Good and Attention Span: Good  Recall:  Good  Fund of Knowledge: Good  Language: Good  Akathisia:  No  Handed:  Right  AIMS (if indicated): not done  Assets:  Communication Skills Desire for Improvement Financial Resources/Insurance Housing Intimacy Leisure Time Resilience Social Support Talents/Skills Transportation Vocational/Educational  ADL's:  Intact  Cognition: WNL  Sleep:  Good   Screenings: PHQ2-9     Office Visit from 03/24/2017 in [place] Office Visit from 11/12/2016 in [place] Office Visit from 08/20/2016 in [place] Office Visit from 07/20/2016 in [place] Office Visit from 03/30/2016 in [place]  PHQ-2 Total Score  0  0  0  0  0      I reviewed the information below on 07/01/2017 and agree except where noted/changed Assessment and Plan: MDD-recurrent, moderate; GAD; Insomnia;  OCD      Medication management with supportive therapy. Risks/benefits and SE of the medication discussed. Pt verbalized understanding and verbal consent obtained for treatment.  Affirm with the patient that the medications are taken as ordered. Patient expressed understanding of how their medications were to be used.    Meds: Effexor XR 75 mg p.o. daily for MDD and GAD Seroquel 150 mg p.o. nightly for MDD, GAD and insomnia Wellbutrin XL 150 mg p.o. daily for MDD and sexual side effects related to SNRI use     Labs: Reviewed labs done 11/02/2016 with creatinine of 1.04, CBC within normal limits Labs done 09/17/2016 show prolactin 6.8, hemoglobin 4.4, TSH within normal limits   Therapy: brief supportive therapy provided. Discussed psychosocial stressors in detail.       Consultations: pt no longer in therapy Encouraged to follow up with PCP as needed   Pt denies SI and is at an acute low risk for suicide. Patient told to call clinic if any problems occur. Patient advised to go to ER if they should develop SI/HI, side effects, or if symptoms worsen. Has crisis numbers to call if needed. Pt verbalized understanding.   F/up in 3 months or sooner if needed    [name], MD 07/01/2017, 2:31 PM

## 2017-07-09 ENCOUNTER — Encounter: Payer: Self-pay | Admitting: Family Medicine

## 2017-07-09 ENCOUNTER — Other Ambulatory Visit: Payer: Self-pay

## 2017-07-09 ENCOUNTER — Ambulatory Visit (INDEPENDENT_AMBULATORY_CARE_PROVIDER_SITE_OTHER): Payer: 59 | Admitting: Family Medicine

## 2017-07-09 VITALS — BP 138/70 | HR 90 | Temp 98.8°F | Wt 208.0 lb

## 2017-07-09 DIAGNOSIS — N898 Other specified noninflammatory disorders of vagina: Secondary | ICD-10-CM

## 2017-07-09 DIAGNOSIS — R1030 Lower abdominal pain, unspecified: Secondary | ICD-10-CM | POA: Diagnosis not present

## 2017-07-09 LAB — POCT WET PREP (WET MOUNT)
Clue Cells Wet Prep Whiff POC: NEGATIVE
Trichomonas Wet Prep HPF POC: ABSENT

## 2017-07-09 MED ORDER — FLUCONAZOLE 150 MG PO TABS: 150.0000 mg | ORAL_TABLET | Freq: Every day | ORAL | 0 refills | Status: DC

## 2017-07-09 NOTE — Progress Notes (Signed)
    Subjective:    Patient ID: Wendy Duffy, female    DOB: 04/26/1975, 42 y.o.   MRN: [id number]   CC: pelvic pain  Reports vaginal discharge, dull pelvic pain. Discharge is thick. Started a week ago. She endorses itching as well. She has not tried any over the counter medications. No concerns for STDs.   She reports cramping sensation since hysterectomy about once a month. She had hysterectomy about a year ago laparoscopically. She did not have ovaries removed. Uterus removed due to fibroids. Denies weight loss, fevers, night sweats, nausea, vomiting.  Smoking status reviewed- non-smoker  Review of Systems- see HPI   Objective:  BP 138/70   Pulse 90   Temp 98.8 F (37.1 C) (Oral)   Wt 208 lb (94.3 kg)   LMP 09/20/2015 (Exact Date)   SpO2 99%   BMI 39.30 kg/m  Vitals and nursing note reviewed  General: well nourished, in no acute distress Cardiac: RRR, clear S1 and S2, no murmurs, rubs, or gallops Respiratory: clear to auscultation bilaterally, no increased work of breathing Abdomen: soft, nontender, nondistended, no masses or organomegaly. Bowel sounds present GU: normal female external genitalia. Absent cervix. Moderate amount of thick white discharge present. No adnexal masses. Extremities: no edema or cyanosis. Skin: warm and dry, no rashes noted Neuro: alert and oriented, no focal deficits   Assessment & Plan:   1. Vaginal discharge Exam and wet prep consistent with yeast. Will treat w/ diflucan x 1. Follow up if symptoms fail to improve.  - POCT Wet Prep [employer])  2. Intermittent lower abdominal pain No concerning exam findings. Patient has ovaries and is still ovulating which may cause monthly discomfort. No adnexal masses but exam limited by body habitus. May be related to scar tissue from hysterectomy. Discussed reasons to return to be seen. Would consider pelvic ultrasound if pain worsens or becomes concerning to patient. Patient verbalized understanding  and agreement with plan.   Return if symptoms worsen or fail to improve.   Lucila Maine, DO Family Medicine Resident PGY-2

## 2017-07-09 NOTE — Patient Instructions (Signed)
   It was nice to see you today.  Please take the medication I sent in as prescribed.   If your abdominal pain continues or worsens, please return to be seen. We could consider doing an ultrasound in the future to further assess pain.  If you have questions or concerns please do not hesitate to call at [phone].  Lucila Maine, DO PGY-2, [facility] 07/09/2017 2:59 PM

## 2017-07-12 ENCOUNTER — Other Ambulatory Visit: Payer: Self-pay

## 2017-07-12 ENCOUNTER — Emergency Department (HOSPITAL_COMMUNITY)
Admission: EM | Admit: 2017-07-12 | Discharge: 2017-07-12 | Disposition: A | Discharge status: Home / Self Care | Payer: 59 | Attending: Emergency Medicine | Admitting: Emergency Medicine

## 2017-07-12 ENCOUNTER — Encounter (HOSPITAL_COMMUNITY): Payer: Self-pay

## 2017-07-12 ENCOUNTER — Emergency Department (HOSPITAL_COMMUNITY): Payer: 59

## 2017-07-12 DIAGNOSIS — J069 Acute upper respiratory infection, unspecified: Secondary | ICD-10-CM | POA: Diagnosis not present

## 2017-07-12 DIAGNOSIS — Z79899 Other long term (current) drug therapy: Secondary | ICD-10-CM | POA: Diagnosis not present

## 2017-07-12 DIAGNOSIS — I10 Essential (primary) hypertension: Secondary | ICD-10-CM | POA: Diagnosis not present

## 2017-07-12 DIAGNOSIS — R05 Cough: Secondary | ICD-10-CM | POA: Diagnosis not present

## 2017-07-12 MED ORDER — GUAIFENESIN-CODEINE 100-10 MG/5ML PO SYRP: 5.0000 mL | ORAL_SOLUTION | Freq: Three times a day (TID) | ORAL | 0 refills | Status: DC | PRN

## 2017-07-12 NOTE — ED Provider Notes (Signed)
[facility]ENC[facility] DEPARTMENT Provider Note   CSN: [id number] Arrival date & time: 07/12/17  1112     History   Chief Complaint Chief Complaint  Patient presents with  . URI    HPI Wendy Duffy is a 42 y.o. female.  HPI   Ms. Grennan is a 42 year old female with a history of hypertension, anxiety and depression department for evaluation of cough, congestion, sore throat and body aches.  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  Reports that she thinks she got sick from this child.  States that 2 days ago she developed a productive cough of a yellow sputum. Has also had persistent rhinorrhea.  States that the back of her throat feels scratchy and also her eyes feel itchy.  States that her body is generally achy in the arms and legs. Has been taking ibuprofen, NyQuil, Zyrtec for her symptoms without improvement. Denies fever, chills, ear pain, sob, wheezing or chest pain, abdominal pain, nausea/vomiting.   Past Medical History:  Diagnosis Date  . Anemia   . Anxiety   . Depression   . Gastritis   . Hemorrhoids   . Hypertension   . Sleep apnea    questionable refered for sleep study will schedule appointment   . Status post laparoscopic hysterectomy 10/21/2015   TLH with BSO    Patient Active Problem List   Diagnosis Date Noted  . Cough 03/24/2017  . Scabies infestation 11/12/2016  . Back pain 03/30/2016  . Status post laparoscopic hysterectomy 10/21/2015  . Anemia 09/20/2015  . Pap smear abnormality of cervix/human papillomavirus (HPV) positive 04/09/2015  . MDD (major depressive disorder), recurrent episode, moderate ([place]) 01/15/2015  . GAD (generalized anxiety disorder) 01/15/2015  . Migraine 01/31/2014  . Birth control 06/14/2012  . Depression 04/06/2012  . Viral URI with cough 07/14/2011  . Preventative health care 12/12/2010  . Anxiety 05/21/2010  .  Disturbance in sleep behavior 10/01/2009  . VENTRICULAR HYPERTROPHY, LEFT 06/15/2007  . HYPERTENSION, BENIGN 05/26/2006  . OBESITY, NOS 05/13/2006    Past Surgical History:  Procedure Laterality Date  . BILATERAL SALPINGECTOMY Bilateral 10/21/2015   Procedure: BILATERAL SALPINGECTOMY;  Surgeon: Sherlyn Hay, DO;  Location: [facility];  Service: Gynecology;  Laterality: Bilateral;  . [place], 2003  . LAPAROSCOPIC HYSTERECTOMY N/A 10/21/2015   Procedure: HYSTERECTOMY TOTAL LAPAROSCOPIC;  Surgeon: Sherlyn Hay, DO;  Location: [facility];  Service: Gynecology;  Laterality: N/A;  . WISDOM TOOTH EXTRACTION       OB History    Gravida  6   Para  3   Term  3   Preterm  0   AB  3   Living  3     SAB      TAB      Ectopic      Multiple      Live Births               Home Medications    Prior to Admission medications   Medication Sig Start Date End Date Taking? Authorizing Provider  amLODipine (NORVASC) 10 MG tablet TAKE ONE TABLET BY MOUTH DAILY 06/14/17   Smiley Houseman, MD  benzonatate (TESSALON) 100 MG capsule Take 1 capsule (100 mg total) by mouth 2 (two) times daily as needed for cough. 03/24/17   Lind Covert, MD  buPROPion (WELLBUTRIN XL) 150  MG 24 hr tablet Take 1 tablet (150 mg total) by mouth daily. 07/01/17   [name], MD  cetirizine (ZYRTEC) 10 MG tablet Take 1 tablet (10 mg total) by mouth daily as needed for allergies. 07/20/16   Katheren Shams, DO  fluconazole (DIFLUCAN) 150 MG tablet Take 1 tablet (150 mg total) by mouth daily. 07/09/17   Steve Rattler, DO  fluticasone (FLONASE) 50 MCG/ACT nasal spray Place 2 sprays into both nostrils daily. 07/20/16   Katheren Shams, DO  hydrochlorothiazide (HYDRODIURIL) 25 MG tablet TAKE ONE TABLET BY MOUTH DAILY 05/17/17   Smiley Houseman, MD  ibuprofen (ADVIL,MOTRIN) 800 MG tablet Take 1 tablet (800 mg total) by mouth every 6 (six) hours as needed for mild pain, moderate pain or  cramping. 10/22/15   Sherlyn Hay, DO  losartan (COZAAR) 100 MG tablet TAKE ONE TABLET BY MOUTH DAILY 06/14/17   Smiley Houseman, MD  omeprazole (PRILOSEC) 40 MG capsule take 1 capsule by mouth once daily 07/28/13   Hairford, Tyler Pita, MD  QUEtiapine (SEROQUEL) 100 MG tablet Take 1.5 tablets (150 mg total) by mouth at bedtime. 07/01/17 07/01/18  [name], MD  venlafaxine XR (EFFEXOR-XR) 150 MG 24 hr capsule Take 1 capsule (150 mg total) by mouth daily. 07/01/17   [name], MD    Family History Family History  Problem Relation Age of Onset  . Alcohol abuse Mother   . Drug abuse Maternal Uncle   . Alcohol abuse Maternal Grandmother   . Drug abuse Maternal Grandmother     Social History Social History   Tobacco Use  . Smoking status: Never Smoker  . Smokeless tobacco: Never Used  Substance Use Topics  . Alcohol use: Yes    Alcohol/week: 1.8 oz    Types: 1 Glasses of wine, 2 Cans of beer per week    Comment: weekly  . Drug use: No    Types: Marijuana    Comment: last used THC in November 2015. using a few times a year     Allergies   Zoloft [sertraline hcl]   Review of Systems Review of Systems  Constitutional: Negative for chills and fever.  HENT: Positive for congestion, rhinorrhea and sore throat. Negative for ear pain, sinus pain and trouble swallowing.   Respiratory: Positive for cough. Negative for shortness of breath and wheezing.   Cardiovascular: Negative for chest pain.  Gastrointestinal: Negative for abdominal pain, nausea and vomiting.  Musculoskeletal: Positive for myalgias.  Skin: Negative for rash.     Physical Exam Updated Vital Signs BP 131/88 (BP Location: Right Arm)   Pulse 90   Temp 98.8 F (37.1 C) (Oral)   Resp 16   Ht 5\' 1"  (1.549 m)   Wt 94.8 kg (209 lb)   LMP 09/20/2015 (Exact Date)   SpO2 100%   BMI 39.49 kg/m   Physical Exam  Constitutional: She appears well-developed and well-nourished. No distress.  HENT:    Head: Normocephalic and atraumatic.  Mouth/Throat: Oropharynx is clear and moist. No oropharyngeal exudate.  Clear rhinorrhea in bilateral nares. Mucous membranes moist. Post-nasal drip present. Posterior oropharynx without erythema. No tonsillar swelling or exudate. No tenderness over maxillary or frontal sinuses.   Eyes: Pupils are equal, round, and reactive to light. Conjunctivae are normal. Right eye exhibits no discharge. Left eye exhibits no discharge.  Neck: Normal range of motion. Neck supple.  Bilateral anterior chain cervical adenopathy.  Cardiovascular: Normal rate, regular rhythm and intact distal pulses.  Exam reveals no friction rub.  No murmur heard. Pulmonary/Chest: Effort normal and breath sounds normal. No stridor. No respiratory distress. She has no wheezes. She has no rales.  Abdominal: Soft. Bowel sounds are normal. There is no tenderness.  Neurological: She is alert. Coordination normal.  Skin: Skin is warm and dry. She is not diaphoretic.  Psychiatric: She has a normal mood and affect. Her behavior is normal.  Nursing note and vitals reviewed.    ED Treatments / Results  Labs (all labs ordered are listed, but only abnormal results are displayed) Labs Reviewed - No data to display  EKG None  Radiology Dg Chest 2 View  Result Date: 07/12/2017 CLINICAL DATA:  Cough and body aches. EXAM: CHEST - 2 VIEW COMPARISON:  01/20/2016. FINDINGS: Trachea is midline. Heart size normal. Lungs are clear. No pleural fluid. IMPRESSION: No acute findings. Electronically Signed   By: [name] Picket M.D.   On: 07/12/2017 14:40    Procedures Procedures (including critical care time)  Medications Ordered in ED Medications - No data to display   Initial Impression / Assessment and Plan / ED Course  I have reviewed the triage vital signs and the nursing notes.  Pertinent labs & imaging results that were available during my care of the patient were reviewed by me and considered  in my medical decision making (see chart for details).     Pt CXR negative for acute infiltrate. Patients symptoms are consistent with URI, likely viral etiology. Discussed that antibiotics are not indicated for viral infections. Pt will be discharged with symptomatic treatment.  Verbalizes understanding and is agreeable with plan. Pt is hemodynamically stable & in NAD prior to dc.  Final Clinical Impressions(s) / ED Diagnoses   Final diagnoses:  Viral upper respiratory tract infection    ED Discharge Orders        Ordered    guaiFENesin-codeine [facility] AC) 100-10 MG/5ML syrup  3 times daily PRN     07/12/17 1506       Glyn Ade, [place] 07/12/17 1506    Tegeler, Gwenyth Allegra, MD 07/12/17 1943

## 2017-07-12 NOTE — Discharge Instructions (Addendum)
Your chest x-ray was reassuring.  No pneumonia.  You have an upper respiratory tract infection.  I have written you a prescription for a cough medicine with codeine in it.  This medicine can make you drowsy so please do not drive or work while taking it.  Please drink plenty of fluids and get rest at home to help with your recovery.  Return to the emergency department if you have any new or concerning symptoms like trouble breathing, fever greater than 100.4 F or you have chest pain.

## 2017-07-12 NOTE — ED Notes (Signed)
Patient transported to X-ray 

## 2017-07-12 NOTE — ED Triage Notes (Signed)
Pt endorses flu sx with productive cough and nasal drainage since Saturday night. Afebrile, VSS.

## 2017-08-07 ENCOUNTER — Other Ambulatory Visit: Payer: Self-pay | Admitting: Internal Medicine

## 2017-08-10 NOTE — Telephone Encounter (Signed)
Patient needs to come in for an appointment for F/u HTN

## 2017-08-11 NOTE — Telephone Encounter (Signed)
lmovm for pt to return call.  Please make appt when she returns call. Fleeger, Salome Spotted, CMA

## 2017-09-06 ENCOUNTER — Other Ambulatory Visit: Payer: Self-pay | Admitting: Internal Medicine

## 2017-09-06 NOTE — Telephone Encounter (Signed)
Have patient follow up for HTN at some point. SHe needs BMET to check electrolytes and Creatinine since she is on this med

## 2017-09-19 ENCOUNTER — Other Ambulatory Visit: Payer: Self-pay | Admitting: Internal Medicine

## 2017-09-30 ENCOUNTER — Encounter (HOSPITAL_COMMUNITY): Payer: Self-pay | Admitting: Psychiatry

## 2017-09-30 ENCOUNTER — Ambulatory Visit (INDEPENDENT_AMBULATORY_CARE_PROVIDER_SITE_OTHER): Payer: 59 | Admitting: Psychiatry

## 2017-09-30 VITALS — BP 116/85 | HR 115 | Ht 61.0 in | Wt 206.6 lb

## 2017-09-30 DIAGNOSIS — F5105 Insomnia due to other mental disorder: Secondary | ICD-10-CM

## 2017-09-30 DIAGNOSIS — F411 Generalized anxiety disorder: Secondary | ICD-10-CM

## 2017-09-30 DIAGNOSIS — F331 Major depressive disorder, recurrent, moderate: Secondary | ICD-10-CM | POA: Diagnosis not present

## 2017-09-30 DIAGNOSIS — F422 Mixed obsessional thoughts and acts: Secondary | ICD-10-CM | POA: Diagnosis not present

## 2017-09-30 DIAGNOSIS — F99 Mental disorder, not otherwise specified: Secondary | ICD-10-CM

## 2017-09-30 DIAGNOSIS — Z79899 Other long term (current) drug therapy: Secondary | ICD-10-CM

## 2017-09-30 MED ORDER — QUETIAPINE FUMARATE 100 MG PO TABS: 150.0000 mg | ORAL_TABLET | Freq: Every day | ORAL | 1 refills | Status: DC

## 2017-09-30 MED ORDER — VENLAFAXINE HCL ER 150 MG PO CP24: 150.0000 mg | ORAL_CAPSULE | Freq: Every day | ORAL | 1 refills | Status: DC

## 2017-09-30 MED ORDER — BUPROPION HCL ER (XL) 150 MG PO TB24: 150.0000 mg | ORAL_TABLET | Freq: Every day | ORAL | 1 refills | Status: DC

## 2017-09-30 NOTE — Progress Notes (Signed)
[facility] MD/PA/NP OP Progress Note  09/30/2017 2:16 PM MESA JANUS  MRN:  [id number]  Chief Complaint:  Chief Complaint    Depression     HPI: "I'm doing good. Nothing has changed". She is going to back to school after 8 yrs. She starts in Aug and she will be taking online classes. She is excited and nervous at the same time. Overall her anxiety is mild and tolerable. She hasn't really anxious about anything lately. Work is going well. Pt is proud that she hasn't made any rash decisions lately. OCD is better. She is able to tell herself some things that decrease her worry and obsessiveness. Pt denies depression and anhedonia. Sleep is good. Her husband tells her she talks a lot in sleep. Pt has very vivid dreams and has nightmares about fearful things about once a week. Pt denies worthlessness and hopelessness. Pt denies SI/HI.   Visit Diagnosis:    ICD-10-CM   1. MDD (major depressive disorder), recurrent episode, moderate (HCC) F33.1 buPROPion (WELLBUTRIN XL) 150 MG 24 hr tablet    QUEtiapine (SEROQUEL) 100 MG tablet    venlafaxine XR (EFFEXOR-XR) 150 MG 24 hr capsule  2. GAD (generalized anxiety disorder) F41.1 QUEtiapine (SEROQUEL) 100 MG tablet    venlafaxine XR (EFFEXOR-XR) 150 MG 24 hr capsule  3. Insomnia due to other mental disorder F51.05 QUEtiapine (SEROQUEL) 100 MG tablet   F99   4. Mixed obsessional thoughts and acts F42.2 venlafaxine XR (EFFEXOR-XR) 150 MG 24 hr capsule      Past Psychiatric History:  Anxiety: Yes Bipolar Disorder: No Depression: Yes Mania: No Psychosis: No Schizophrenia: No Personality Disorder: No Hospitalization for psychiatric illness: No History of Electroconvulsive Shock Therapy: No Prior Suicide Attempts: Yes     Past Medical History:  Past Medical History:  Diagnosis Date  . Anemia   . Anxiety   . Depression   . Gastritis   . Hemorrhoids   . Hypertension   . Sleep apnea    questionable refered for sleep study will schedule  appointment   . Status post laparoscopic hysterectomy 10/21/2015   TLH with BSO    Past Surgical History:  Procedure Laterality Date  . BILATERAL SALPINGECTOMY Bilateral 10/21/2015   Procedure: BILATERAL SALPINGECTOMY;  Surgeon: Sherlyn Hay, DO;  Location: [facility];  Service: Gynecology;  Laterality: Bilateral;  . [place], 2003  . LAPAROSCOPIC HYSTERECTOMY N/A 10/21/2015   Procedure: HYSTERECTOMY TOTAL LAPAROSCOPIC;  Surgeon: Sherlyn Hay, DO;  Location: [facility];  Service: Gynecology;  Laterality: N/A;  . WISDOM TOOTH EXTRACTION      Family Psychiatric History:  Family History  Problem Relation Age of Onset  . Alcohol abuse Mother   . Drug abuse Maternal Uncle   . Alcohol abuse Maternal Grandmother   . Drug abuse Maternal Grandmother     Social History:  Social History   Socioeconomic History  . Marital status: Married    Spouse name: Not on file  . Number of children: Not on file  . Years of education: Not on file  . Highest education level: Not on file  Occupational History  . Occupation: Print production planner  Social Needs  . Financial resource strain: Not on file  . Food insecurity:    Worry: Not on file    Inability: Not on file  . Transportation needs:    Medical: Not on file    Non-medical: Not on file  Tobacco Use  . Smoking status: Never Smoker  .  Smokeless tobacco: Never Used  Substance and Sexual Activity  . Alcohol use: Yes    Alcohol/week: 1.8 oz    Types: 1 Glasses of wine, 2 Cans of beer per week    Comment: weekly  . Drug use: No    Types: Marijuana    Comment: last used THC in November 2015. using a few times a year  . Sexual activity: Yes    Birth control/protection: None  Lifestyle  . Physical activity:    Days per week: Not on file    Minutes per session: Not on file  . Stress: Not on file  Relationships  . Social connections:    Talks on phone: Not on file    Gets together: Not on file    Attends religious  service: Not on file    Active member of club or organization: Not on file    Attends meetings of clubs or organizations: Not on file    Relationship status: Not on file  Other Topics Concern  . Not on file  Social History Narrative  . Not on file    Allergies:  Allergies  Allergen Reactions  . Zoloft [Sertraline Hcl]     Made her feel very anxious     Metabolic Disorder Labs: Lab Results  Component Value Date   HGBA1C 4.4 09/17/2016   MPG 80 09/17/2016   MPG 108 05/23/2015   Lab Results  Component Value Date   PROLACTIN 6.8 09/17/2016   PROLACTIN 5.7 05/23/2015   Lab Results  Component Value Date   CHOL 148 09/17/2016   TRIG 121 09/17/2016   HDL 86 09/17/2016   CHOLHDL 1.7 09/17/2016   VLDL 24 09/17/2016   LDLCALC 38 09/17/2016   LDLCALC 61 05/23/2015   Lab Results  Component Value Date   TSH 0.58 09/17/2016   TSH 0.82 05/23/2015    Therapeutic Level Labs: No results found for: LITHIUM No results found for: VALPROATE No components found for:  CBMZ  Current Medications: Current Outpatient Medications  Medication Sig Dispense Refill  . amLODipine (NORVASC) 10 MG tablet TAKE ONE TABLET BY MOUTH DAILY 30 tablet 1  . buPROPion (WELLBUTRIN XL) 150 MG 24 hr tablet Take 1 tablet (150 mg total) by mouth daily. 90 tablet 1  . cetirizine (ZYRTEC) 10 MG tablet Take 1 tablet (10 mg total) by mouth daily as needed for allergies. 30 tablet 2  . fluticasone (FLONASE) 50 MCG/ACT nasal spray Place 2 sprays into both nostrils daily. 16 g 6  . hydrochlorothiazide (HYDRODIURIL) 25 MG tablet TAKE ONE TABLET BY MOUTH DAILY 30 tablet 3  . ibuprofen (ADVIL,MOTRIN) 800 MG tablet Take 1 tablet (800 mg total) by mouth every 6 (six) hours as needed for mild pain, moderate pain or cramping. 40 tablet 1  . losartan (COZAAR) 100 MG tablet TAKE ONE TABLET BY MOUTH DAILY 30 tablet 0  . omeprazole (PRILOSEC) 40 MG capsule take 1 capsule by mouth once daily 90 capsule 6  . QUEtiapine  (SEROQUEL) 100 MG tablet Take 1.5 tablets (150 mg total) by mouth at bedtime. 135 tablet 1  . venlafaxine XR (EFFEXOR-XR) 150 MG 24 hr capsule Take 1 capsule (150 mg total) by mouth daily. 90 capsule 1  . benzonatate (TESSALON) 100 MG capsule Take 1 capsule (100 mg total) by mouth 2 (two) times daily as needed for cough. 20 capsule 0  . fluconazole (DIFLUCAN) 150 MG tablet Take 1 tablet (150 mg total) by mouth daily. (Patient not taking: Reported  on 09/30/2017) 1 tablet 0  . guaiFENesin-codeine (ROBITUSSIN AC) 100-10 MG/5ML syrup Take 5 mLs by mouth 3 (three) times daily as needed for cough. (Patient not taking: Reported on 09/30/2017) 120 mL 0   No current facility-administered medications for this visit.      Musculoskeletal: Strength & Muscle Tone: within normal limits Gait & Station: normal Patient leans: N/A  Psychiatric Specialty Exam: Review of Systems  Constitutional: Negative for chills, diaphoresis and fever.  Gastrointestinal: Negative for abdominal pain, heartburn, nausea and vomiting.    Blood pressure 116/85, pulse (!) 115, height 5\' 1"  (1.549 m), weight 206 lb 9.6 oz (93.7 kg), last menstrual period 09/20/2015.Body mass index is 39.04 kg/m.  General Appearance: Casual  Eye Contact:  Good  Speech:  Clear and Coherent and Normal Rate  Volume:  Normal  Mood:  Euthymic  Affect:  Full Range  Thought Process:  Goal Directed and Descriptions of Associations: Intact  Orientation:  Full (Time, Place, and Person)  Thought Content: Logical   Suicidal Thoughts:  No  Homicidal Thoughts:  No  Memory:  Immediate;   Good Recent;   Good Remote;   Good  Judgement:  Good  Insight:  Good  Psychomotor Activity:  Normal  Concentration:  Concentration: Good and Attention Span: Good  Recall:  Good  Fund of Knowledge: Good  Language: Good  Akathisia:  No  Handed:  Right  AIMS (if indicated): not done  Assets:  Communication Skills Desire for Improvement Financial  Resources/Insurance [place] Talents/Skills Transportation Vocational/Educational  ADL's:  Intact  Cognition: WNL  Sleep:  Good   Screenings: PHQ2-9     Office Visit from 07/09/2017 in [facility] Office Visit from 03/24/2017 in [facility] Office Visit from 11/12/2016 in [facility] Office Visit from 08/20/2016 in [facility] Office Visit from 07/20/2016 in [facility]  PHQ-2 Total Score  0  0  0  0  0      I reviewed the information below on 09/30/2017 and agree except where noted/changed Assessment and Plan:  MDD-recurrent, moderate; GAD; Insomnia; OCD      Medication management with supportive therapy. Risks/benefits and SE of the medication discussed. Pt verbalized understanding and verbal consent obtained for treatment.  Affirm with the patient that the medications are taken as ordered. Patient expressed understanding of how their medications were to be used.    Meds: Effexor XR 75 mg p.o. daily for MDD and GAD Seroquel 150 mg p.o. nightly for MDD, GAD and insomnia Wellbutrin XL 150 mg p.o. daily for MDD and sexual side effects related to SNRI use     Labs: Labs: ordered CBC, CMP, HbA1c, Lipid panel, TSH, Prolactin level, EKG   Therapy: brief supportive therapy provided. Discussed psychosocial stressors in detail.       Consultations: pt no longer in therapy Encouraged to follow up with PCP as needed   Pt denies SI and is at an acute low risk for suicide. Patient told to call clinic if any problems occur. Patient advised to go to ER if they should develop SI/HI, side effects, or if symptoms worsen. Has crisis numbers to call if needed. Pt verbalized understanding.   F/up in 4 months or sooner if needed    [name], MD 09/30/2017, 2:16 PM

## 2017-10-01 LAB — CBC WITH DIFFERENTIAL/PLATELET
Basophils Absolute: 0 10*3/uL (ref 0.0–0.2)
Basos: 0 %
EOS (ABSOLUTE): 0.3 10*3/uL (ref 0.0–0.4)
EOS: 5 %
HEMATOCRIT: 42.5 % (ref 34.0–46.6)
Hemoglobin: 14.6 g/dL (ref 11.1–15.9)
Immature Grans (Abs): 0 10*3/uL (ref 0.0–0.1)
Immature Granulocytes: 0 %
LYMPHS: 37 %
Lymphocytes Absolute: 2.5 10*3/uL (ref 0.7–3.1)
MCH: 31.3 pg (ref 26.6–33.0)
MCHC: 34.4 g/dL (ref 31.5–35.7)
MCV: 91 fL (ref 79–97)
MONOCYTES: 10 %
MONOS ABS: 0.7 10*3/uL (ref 0.1–0.9)
NEUTROS PCT: 48 %
Neutrophils Absolute: 3.2 10*3/uL (ref 1.4–7.0)
Platelets: 366 10*3/uL (ref 150–450)
RBC: 4.66 x10E6/uL (ref 3.77–5.28)
RDW: 14.2 % (ref 12.3–15.4)
WBC: 6.7 10*3/uL (ref 3.4–10.8)

## 2017-10-01 LAB — COMPREHENSIVE METABOLIC PANEL
ALK PHOS: 69 IU/L (ref 39–117)
ALT: 22 IU/L (ref 0–32)
AST: 20 IU/L (ref 0–40)
Albumin/Globulin Ratio: 1.6 (ref 1.2–2.2)
Albumin: 4.6 g/dL (ref 3.5–5.5)
BUN/Creatinine Ratio: 20 (ref 9–23)
BUN: 17 mg/dL (ref 6–24)
Bilirubin Total: 0.8 mg/dL (ref 0.0–1.2)
CO2: 23 mmol/L (ref 20–29)
CREATININE: 0.85 mg/dL (ref 0.57–1.00)
Calcium: 10 mg/dL (ref 8.7–10.2)
Chloride: 98 mmol/L (ref 96–106)
GFR calc Af Amer: 98 mL/min/{1.73_m2} (ref >59)
GFR calc non Af Amer: 85 mL/min/{1.73_m2} (ref >59)
GLUCOSE: 85 mg/dL (ref 65–99)
Globulin, Total: 2.9 g/dL (ref 1.5–4.5)
Potassium: 3.9 mmol/L (ref 3.5–5.2)
SODIUM: 139 mmol/L (ref 134–144)
Total Protein: 7.5 g/dL (ref 6.0–8.5)

## 2017-10-01 LAB — HEMOGLOBIN A1C
Est. average glucose Bld gHb Est-mCnc: 85 mg/dL
HEMOGLOBIN A1C: 4.6 % — AB (ref 4.8–5.6)

## 2017-10-01 LAB — TSH: TSH: 0.435 u[IU]/mL — ABNORMAL LOW (ref 0.450–4.500)

## 2017-10-01 LAB — LIPID PANEL W/O CHOL/HDL RATIO
Cholesterol, Total: 171 mg/dL (ref 100–199)
HDL: 88 mg/dL (ref >39)
LDL CALC: 53 mg/dL (ref 0–99)
Triglycerides: 148 mg/dL (ref 0–149)
VLDL Cholesterol Cal: 30 mg/dL (ref 5–40)

## 2017-10-01 LAB — PROLACTIN: Prolactin: 6.6 ng/mL (ref 4.8–23.3)

## 2017-10-13 ENCOUNTER — Other Ambulatory Visit: Payer: Self-pay | Admitting: Internal Medicine

## 2017-11-16 ENCOUNTER — Other Ambulatory Visit: Payer: Self-pay | Admitting: Internal Medicine

## 2017-12-18 ENCOUNTER — Other Ambulatory Visit: Payer: Self-pay | Admitting: Family Medicine

## 2017-12-18 ENCOUNTER — Other Ambulatory Visit: Payer: Self-pay | Admitting: Internal Medicine

## 2017-12-20 NOTE — Telephone Encounter (Signed)
Will fill 1 month, needs appt

## 2017-12-20 NOTE — Telephone Encounter (Signed)
Will refill but please have patient come in for follow up for BP check

## 2017-12-20 NOTE — Telephone Encounter (Signed)
Tried calling patient but there was no answer and voicemail is full.  Will try to call again tomorrow and then will mail a letter if I can't reach her.  Kealy Lewter,CMA

## 2017-12-20 NOTE — Telephone Encounter (Signed)
Attempted to call.  No answer and VM was full.  Will create letter and route to Admin to mail. Federica Allport, Salome Spotted, CMA

## 2017-12-21 ENCOUNTER — Encounter: Payer: Self-pay | Admitting: *Deleted

## 2017-12-21 NOTE — Telephone Encounter (Signed)
Letter mailed to patient. Jazmin Hartsell,CMA  

## 2017-12-21 NOTE — Telephone Encounter (Signed)
Tried calling patient but voicemail was full.  Wendy Duffy,CMA

## 2018-01-24 ENCOUNTER — Other Ambulatory Visit: Payer: Self-pay | Admitting: Family Medicine

## 2018-01-25 DIAGNOSIS — H1033 Unspecified acute conjunctivitis, bilateral: Secondary | ICD-10-CM | POA: Diagnosis not present

## 2018-02-03 ENCOUNTER — Ambulatory Visit (INDEPENDENT_AMBULATORY_CARE_PROVIDER_SITE_OTHER): Payer: 59 | Admitting: Psychiatry

## 2018-02-03 ENCOUNTER — Encounter (HOSPITAL_COMMUNITY): Payer: Self-pay | Admitting: Psychiatry

## 2018-02-03 VITALS — BP 132/78 | Ht 61.0 in | Wt 210.0 lb

## 2018-02-03 DIAGNOSIS — F331 Major depressive disorder, recurrent, moderate: Secondary | ICD-10-CM

## 2018-02-03 DIAGNOSIS — F422 Mixed obsessional thoughts and acts: Secondary | ICD-10-CM

## 2018-02-03 DIAGNOSIS — F5105 Insomnia due to other mental disorder: Secondary | ICD-10-CM

## 2018-02-03 DIAGNOSIS — F99 Mental disorder, not otherwise specified: Secondary | ICD-10-CM

## 2018-02-03 DIAGNOSIS — F411 Generalized anxiety disorder: Secondary | ICD-10-CM

## 2018-02-03 MED ORDER — BUPROPION HCL ER (XL) 150 MG PO TB24: 150.0000 mg | ORAL_TABLET | Freq: Every day | ORAL | 1 refills | Status: DC

## 2018-02-03 MED ORDER — QUETIAPINE FUMARATE 100 MG PO TABS: 150.0000 mg | ORAL_TABLET | Freq: Every day | ORAL | 1 refills | Status: DC

## 2018-02-03 MED ORDER — VENLAFAXINE HCL ER 75 MG PO CP24: 225.0000 mg | ORAL_CAPSULE | Freq: Every day | ORAL | 1 refills | Status: DC

## 2018-02-03 NOTE — Progress Notes (Signed)
BH MD/PA/NP OP Progress Note  02/03/2018 2:26 PM Wendy Duffy  MRN:  [id number]  Chief Complaint:  Chief Complaint    Depression; Anxiety     HPI: Patient has started school, in addition to working.  She states that she feels extremely overwhelmed.  The holiday season in general is very busy for her due to family birthdays and other such things.  She is denying depression but she reports that she is feeling very anxious.  She is having finals starting in a few weeks and after that she gets a Christmas break.  She is looking forward to not having to do anything.  Lately she is feeling so overwhelmed that she does not want to go out or do much of anything.  Her sleep is good she is no longer having nightmares.  She denies SI/HI.    ICD-10-CM   1. GAD (generalized anxiety disorder) F41.1 QUEtiapine (SEROQUEL) 100 MG tablet    venlafaxine XR (EFFEXOR-XR) 75 MG 24 hr capsule  2. MDD (major depressive disorder), recurrent episode, moderate (HCC) F33.1 buPROPion (WELLBUTRIN XL) 150 MG 24 hr tablet    QUEtiapine (SEROQUEL) 100 MG tablet    venlafaxine XR (EFFEXOR-XR) 75 MG 24 hr capsule  3. Insomnia due to other mental disorder F51.05 QUEtiapine (SEROQUEL) 100 MG tablet   F99   4. Mixed obsessional thoughts and acts F42.2 venlafaxine XR (EFFEXOR-XR) 75 MG 24 hr capsule      Past Psychiatric History:  Anxiety: Yes Bipolar Disorder: No Depression: Yes Mania: No Psychosis: No Schizophrenia: No Personality Disorder: No Hospitalization for psychiatric illness: No History of Electroconvulsive Shock Therapy: No Prior Suicide Attempts: Yes     Past Medical History:  Past Medical History:  Diagnosis Date  . Anemia   . Anxiety   . Depression   . Gastritis   . Hemorrhoids   . Hypertension   . Sleep apnea    questionable refered for sleep study will schedule appointment   . Status post laparoscopic hysterectomy 10/21/2015   TLH with BSO    Past Surgical History:  Procedure  Laterality Date  . BILATERAL SALPINGECTOMY Bilateral 10/21/2015   Procedure: BILATERAL SALPINGECTOMY;  Surgeon: Sherlyn Hay, DO;  Location: [facility];  Service: Gynecology;  Laterality: Bilateral;  . [place], 2003  . LAPAROSCOPIC HYSTERECTOMY N/A 10/21/2015   Procedure: HYSTERECTOMY TOTAL LAPAROSCOPIC;  Surgeon: Sherlyn Hay, DO;  Location: [facility];  Service: Gynecology;  Laterality: N/A;  . WISDOM TOOTH EXTRACTION      Family Psychiatric History:  Family History  Problem Relation Age of Onset  . Alcohol abuse Mother   . Drug abuse Maternal Uncle   . Alcohol abuse Maternal Grandmother   . Drug abuse Maternal Grandmother     Social History:  Social History   Socioeconomic History  . Marital status: Married    Spouse name: Not on file  . Number of children: Not on file  . Years of education: Not on file  . Highest education level: Not on file  Occupational History  . Occupation: Print production planner  Social Needs  . Financial resource strain: Not on file  . Food insecurity:    Worry: Not on file    Inability: Not on file  . Transportation needs:    Medical: Not on file    Non-medical: Not on file  Tobacco Use  . Smoking status: Never Smoker  . Smokeless tobacco: Never Used  Substance and Sexual Activity  . Alcohol  use: Yes    Alcohol/week: 3.0 standard drinks    Types: 1 Glasses of wine, 2 Cans of beer per week    Comment: weekly  . Drug use: No    Types: Marijuana    Comment: last used THC in November 2015. using a few times a year  . Sexual activity: Yes    Birth control/protection: None  Lifestyle  . Physical activity:    Days per week: Not on file    Minutes per session: Not on file  . Stress: Not on file  Relationships  . Social connections:    Talks on phone: Not on file    Gets together: Not on file    Attends religious service: Not on file    Active member of club or organization: Not on file    Attends meetings of clubs or  organizations: Not on file    Relationship status: Not on file  Other Topics Concern  . Not on file  Social History Narrative  . Not on file    Allergies:  Allergies  Allergen Reactions  . Zoloft [Sertraline Hcl]     Made her feel very anxious     Metabolic Disorder Labs: Lab Results  Component Value Date   HGBA1C 4.6 (L) 09/30/2017   MPG 80 09/17/2016   MPG 108 05/23/2015   Lab Results  Component Value Date   PROLACTIN 6.6 09/30/2017   PROLACTIN 6.8 09/17/2016   Lab Results  Component Value Date   CHOL 171 09/30/2017   TRIG 148 09/30/2017   HDL 88 09/30/2017   CHOLHDL 1.7 09/17/2016   VLDL 24 09/17/2016   LDLCALC 53 09/30/2017   LDLCALC 38 09/17/2016   Lab Results  Component Value Date   TSH 0.435 (L) 09/30/2017   TSH 0.58 09/17/2016    Therapeutic Level Labs: No results found for: LITHIUM No results found for: VALPROATE No components found for:  CBMZ  Current Medications: Current Outpatient Medications  Medication Sig Dispense Refill  . amLODipine (NORVASC) 10 MG tablet TAKE ONE TABLET BY MOUTH DAILY 30 tablet 3  . buPROPion (WELLBUTRIN XL) 150 MG 24 hr tablet Take 1 tablet (150 mg total) by mouth daily. 90 tablet 1  . cetirizine (ZYRTEC) 10 MG tablet Take 1 tablet (10 mg total) by mouth daily as needed for allergies. 30 tablet 2  . fluticasone (FLONASE) 50 MCG/ACT nasal spray Place 2 sprays into both nostrils daily. 16 g 6  . guaiFENesin-codeine (ROBITUSSIN AC) 100-10 MG/5ML syrup Take 5 mLs by mouth 3 (three) times daily as needed for cough. 120 mL 0  . hydrochlorothiazide (HYDRODIURIL) 25 MG tablet TAKE ONE TABLET BY MOUTH DAILY 90 tablet 3  . ibuprofen (ADVIL,MOTRIN) 800 MG tablet Take 1 tablet (800 mg total) by mouth every 6 (six) hours as needed for mild pain, moderate pain or cramping. 40 tablet 1  . losartan (COZAAR) 100 MG tablet TAKE ONE TABLET BY MOUTH DAILY 90 tablet 0  . omeprazole (PRILOSEC) 40 MG capsule take 1 capsule by mouth once daily 90  capsule 6  . QUEtiapine (SEROQUEL) 100 MG tablet Take 1.5 tablets (150 mg total) by mouth at bedtime. 135 tablet 1  . venlafaxine XR (EFFEXOR-XR) 75 MG 24 hr capsule Take 3 capsules (225 mg total) by mouth daily. 270 capsule 1   No current facility-administered medications for this visit.      Musculoskeletal: Strength & Muscle Tone: within normal limits Gait & Station: normal Patient leans: N/A  Psychiatric  Specialty Exam: Review of Systems  Constitutional: Negative for chills, diaphoresis and fever.  HENT: Negative for congestion, sinus pain and sore throat.     Blood pressure 132/78, height 5\' 1"  (1.549 m), weight 210 lb (95.3 kg), last menstrual period 09/20/2015.Body mass index is 39.68 kg/m.  General Appearance: Fairly Groomed  Eye Contact:  Good  Speech:  Clear and Coherent and Normal Rate  Volume:  Normal  Mood:  Anxious  Affect:  Full Range  Thought Process:  Goal Directed and Descriptions of Associations: Intact  Orientation:  Full (Time, Place, and Person)  Thought Content:  Logical  Suicidal Thoughts:  No  Homicidal Thoughts:  No  Memory:  Immediate;   Good  Judgement:  Good  Insight:  Good  Psychomotor Activity:  Normal  Concentration:  Concentration: Good  Recall:  Good  Fund of Knowledge:  Good  Language:  Good  Akathisia:  No  Handed:  Right  AIMS (if indicated):     Assets:  Communication Skills Desire for Improvement Financial Resources/Insurance [place] Talents/Skills Transportation Vocational/Educational  ADL's:  Intact  Cognition:  WNL  Sleep:   good     Screenings: PHQ2-9     Office Visit from 07/09/2017 in [facility] Office Visit from 03/24/2017 in [place] Office Visit from 11/12/2016 in [place] Office Visit from 08/20/2016 in [place] Office Visit from 07/20/2016 in [place]  PHQ-2 Total Score  0  0  0  0  0      I reviewed the information below on 02/03/2018 and have updated it Assessment and Plan:  MDD-recurrent, moderate; GAD; Insomnia; OCD   Status of current mood problems; worsening anxiety, stable depression    Medication management with supportive therapy. Risks/benefits and SE of the medication discussed. Pt verbalized understanding and verbal consent obtained for treatment.  Affirm with the patient that the medications are taken as ordered. Patient expressed understanding of how their medications were to be used.    Meds: increase Effexor XR 150 mg p.o. daily for MDD and GAD Seroquel 150 mg p.o. nightly for MDD, GAD and insomnia Wellbutrin XL 150 mg p.o. daily for MDD and sexual side effects related to SNRI use     Labs:  Reviewed labs done on 09/30/2017 CBC wnl, CMP wnl, HbA1c 4.6, Lipid panel wnl, TSH 0.435, Prolactin level 6.6    Therapy: brief supportive therapy provided. Discussed psychosocial stressors in detail.       Consultations: pt no longer in therapy Encouraged to follow up with PCP as needed   Pt denies SI and is at an acute low risk for suicide. Patient told to call clinic if any problems occur. Patient advised to go to ER if they should develop SI/HI, side effects, or if symptoms worsen. Has crisis numbers to call if needed. Pt verbalized understanding.   F/up in 3 months or sooner if needed    [name], MD 02/03/2018, 2:26 PM

## 2018-04-11 ENCOUNTER — Other Ambulatory Visit: Payer: Self-pay | Admitting: Specialist

## 2018-04-11 ENCOUNTER — Other Ambulatory Visit: Payer: Self-pay | Admitting: Emergency Medical Services

## 2018-04-11 DIAGNOSIS — Z1231 Encounter for screening mammogram for malignant neoplasm of breast: Secondary | ICD-10-CM

## 2018-04-28 ENCOUNTER — Ambulatory Visit (INDEPENDENT_AMBULATORY_CARE_PROVIDER_SITE_OTHER): Payer: 59 | Admitting: Psychiatry

## 2018-04-28 ENCOUNTER — Encounter (HOSPITAL_COMMUNITY): Payer: Self-pay | Admitting: Psychiatry

## 2018-04-28 VITALS — BP 120/90 | HR 80 | Ht 61.0 in | Wt 210.0 lb

## 2018-04-28 DIAGNOSIS — F422 Mixed obsessional thoughts and acts: Secondary | ICD-10-CM

## 2018-04-28 DIAGNOSIS — F99 Mental disorder, not otherwise specified: Secondary | ICD-10-CM

## 2018-04-28 DIAGNOSIS — F5105 Insomnia due to other mental disorder: Secondary | ICD-10-CM

## 2018-04-28 DIAGNOSIS — F411 Generalized anxiety disorder: Secondary | ICD-10-CM

## 2018-04-28 DIAGNOSIS — F331 Major depressive disorder, recurrent, moderate: Secondary | ICD-10-CM | POA: Diagnosis not present

## 2018-04-28 MED ORDER — QUETIAPINE FUMARATE 100 MG PO TABS: 150.0000 mg | ORAL_TABLET | Freq: Every day | ORAL | 0 refills | Status: DC

## 2018-04-28 MED ORDER — BUPROPION HCL ER (XL) 150 MG PO TB24: 150.0000 mg | ORAL_TABLET | Freq: Every day | ORAL | 1 refills | Status: DC

## 2018-04-28 MED ORDER — VENLAFAXINE HCL ER 75 MG PO CP24: 225.0000 mg | ORAL_CAPSULE | Freq: Every day | ORAL | 0 refills | Status: DC

## 2018-04-28 NOTE — Progress Notes (Signed)
[facility] MD/PA/NP OP Progress Note  04/28/2018 3:12 PM Wendy Duffy  MRN:  [id number]  Chief Complaint:  Chief Complaint    Anxiety; Follow-up     HPI: Patient reports that she is doing well.  She visited family over the holidays and states it was a very relaxing vacation for her.  She continues to work and is taking some online classes.  She finds herself procrastinating to the last minute to get her assignments done which causes her a lot of stress and anxiety.  She reports racing thoughts with tremors.  Managing her family's needs and work and school has been overwhelming.  She is denying any depression symptoms.  Her sleep is on the poor side.  She is denying SI/HI.    ICD-10-CM   1. GAD (generalized anxiety disorder) F41.1 QUEtiapine (SEROQUEL) 100 MG tablet    venlafaxine XR (EFFEXOR-XR) 75 MG 24 hr capsule  2. MDD (major depressive disorder), recurrent episode, moderate (HCC) F33.1 buPROPion (WELLBUTRIN XL) 150 MG 24 hr tablet    QUEtiapine (SEROQUEL) 100 MG tablet    venlafaxine XR (EFFEXOR-XR) 75 MG 24 hr capsule  3. Insomnia due to other mental disorder F51.05 QUEtiapine (SEROQUEL) 100 MG tablet   F99   4. Mixed obsessional thoughts and acts F42.2 venlafaxine XR (EFFEXOR-XR) 75 MG 24 hr capsule      Past Psychiatric History:  Anxiety: Yes Bipolar Disorder: No Depression: Yes Mania: No Psychosis: No Schizophrenia: No Personality Disorder: No Hospitalization for psychiatric illness: No History of Electroconvulsive Shock Therapy: No Prior Suicide Attempts: Yes     Past Medical History:  Past Medical History:  Diagnosis Date  . Anemia   . Anxiety   . Depression   . Gastritis   . Hemorrhoids   . Hypertension   . Sleep apnea    questionable refered for sleep study will schedule appointment   . Status post laparoscopic hysterectomy 10/21/2015   TLH with BSO    Past Surgical History:  Procedure Laterality Date  . BILATERAL SALPINGECTOMY Bilateral 10/21/2015   Procedure: BILATERAL SALPINGECTOMY;  Surgeon: Sherlyn Hay, DO;  Location: [facility];  Service: Gynecology;  Laterality: Bilateral;  . [place], 2003  . LAPAROSCOPIC HYSTERECTOMY N/A 10/21/2015   Procedure: HYSTERECTOMY TOTAL LAPAROSCOPIC;  Surgeon: Sherlyn Hay, DO;  Location: [facility];  Service: Gynecology;  Laterality: N/A;  . WISDOM TOOTH EXTRACTION      Family Psychiatric History:  Family History  Problem Relation Age of Onset  . Alcohol abuse Mother   . Drug abuse Maternal Uncle   . Alcohol abuse Maternal Grandmother   . Drug abuse Maternal Grandmother     Social History:  Social History   Socioeconomic History  . Marital status: Married    Spouse name: Not on file  . Number of children: Not on file  . Years of education: Not on file  . Highest education level: Not on file  Occupational History  . Occupation: Print production planner  Social Needs  . Financial resource strain: Not on file  . Food insecurity:    Worry: Not on file    Inability: Not on file  . Transportation needs:    Medical: Not on file    Non-medical: Not on file  Tobacco Use  . Smoking status: Never Smoker  . Smokeless tobacco: Never Used  Substance and Sexual Activity  . Alcohol use: Yes    Alcohol/week: 3.0 standard drinks    Types: 1 Glasses of wine,  2 Cans of beer per week    Comment: weekly  . Drug use: No    Types: Marijuana    Comment: last used THC in November 2015. using a few times a year  . Sexual activity: Yes    Birth control/protection: None  Lifestyle  . Physical activity:    Days per week: Not on file    Minutes per session: Not on file  . Stress: Not on file  Relationships  . Social connections:    Talks on phone: Not on file    Gets together: Not on file    Attends religious service: Not on file    Active member of club or organization: Not on file    Attends meetings of clubs or organizations: Not on file    Relationship status: Not on file  Other  Topics Concern  . Not on file  Social History Narrative  . Not on file    Allergies:  Allergies  Allergen Reactions  . Zoloft [Sertraline Hcl]     Made her feel very anxious     Metabolic Disorder Labs: Lab Results  Component Value Date   HGBA1C 4.6 (L) 09/30/2017   MPG 80 09/17/2016   MPG 108 05/23/2015   Lab Results  Component Value Date   PROLACTIN 6.6 09/30/2017   PROLACTIN 6.8 09/17/2016   Lab Results  Component Value Date   CHOL 171 09/30/2017   TRIG 148 09/30/2017   HDL 88 09/30/2017   CHOLHDL 1.7 09/17/2016   VLDL 24 09/17/2016   LDLCALC 53 09/30/2017   LDLCALC 38 09/17/2016   Lab Results  Component Value Date   TSH 0.435 (L) 09/30/2017   TSH 0.58 09/17/2016    Therapeutic Level Labs: No results found for: LITHIUM No results found for: VALPROATE No components found for:  CBMZ  Current Medications: Current Outpatient Medications  Medication Sig Dispense Refill  . amLODipine (NORVASC) 10 MG tablet TAKE ONE TABLET BY MOUTH DAILY 30 tablet 3  . buPROPion (WELLBUTRIN XL) 150 MG 24 hr tablet Take 1 tablet (150 mg total) by mouth daily. 90 tablet 1  . cetirizine (ZYRTEC) 10 MG tablet Take 1 tablet (10 mg total) by mouth daily as needed for allergies. 30 tablet 2  . fluticasone (FLONASE) 50 MCG/ACT nasal spray Place 2 sprays into both nostrils daily. 16 g 6  . hydrochlorothiazide (HYDRODIURIL) 25 MG tablet TAKE ONE TABLET BY MOUTH DAILY 90 tablet 3  . ibuprofen (ADVIL,MOTRIN) 800 MG tablet Take 1 tablet (800 mg total) by mouth every 6 (six) hours as needed for mild pain, moderate pain or cramping. 40 tablet 1  . losartan (COZAAR) 100 MG tablet TAKE ONE TABLET BY MOUTH DAILY 90 tablet 0  . omeprazole (PRILOSEC) 40 MG capsule take 1 capsule by mouth once daily 90 capsule 6  . QUEtiapine (SEROQUEL) 100 MG tablet Take 1.5 tablets (150 mg total) by mouth at bedtime. 135 tablet 0  . venlafaxine XR (EFFEXOR-XR) 75 MG 24 hr capsule Take 3 capsules (225 mg total) by  mouth daily. 270 capsule 0   No current facility-administered medications for this visit.      Musculoskeletal: Strength & Muscle Tone: within normal limits Gait & Station: normal Patient leans: N/A  Psychiatric Specialty Exam: Review of Systems  Constitutional: Negative for chills, diaphoresis and fever.  Psychiatric/Behavioral: Negative for depression and suicidal ideas. The patient is nervous/anxious. The patient does not have insomnia.     Blood pressure 120/90, pulse 80, height  5\' 1"  (1.549 m), weight 210 lb (95.3 kg), last menstrual period 09/20/2015.Body mass index is 39.68 kg/m.  General Appearance: Fairly Groomed  Eye Contact:  Good  Speech:  Clear and Coherent and Normal Rate  Volume:  Normal  Mood:  Anxious  Affect:  Congruent  Thought Process:  Goal Directed and Descriptions of Associations: Intact  Orientation:  Full (Time, Place, and Person)  Thought Content:  Logical  Suicidal Thoughts:  No  Homicidal Thoughts:  No  Memory:  Immediate;   Good  Judgement:  Fair  Insight:  Good  Psychomotor Activity:  Tremor  Concentration:  Concentration: Good  Recall:  Good  Fund of Knowledge:  Good  Language:  Good  Akathisia:  No  Handed:  Right  AIMS (if indicated):     Assets:  Communication Skills Desire for Improvement Financial Resources/Insurance Housing Intimacy Leisure Time Resilience Social Support Talents/Skills Transportation Vocational/Educational  ADL's:  Intact  Cognition:  WNL  Sleep:   poor       Screenings: PHQ2-9     Office Visit from 07/09/2017 in [facility] Office Visit from 03/24/2017 in [place] Office Visit from 11/12/2016 in [place] Office Visit from 08/20/2016 in [place] Office Visit from 07/20/2016 in [facility]  PHQ-2 Total Score  0  0  0  0  0      I reviewed the information below on 04/28/2018 and have updated  it Assessment and Plan:  MDD-recurrent, moderate; GAD; Insomnia; OCD   Status of current mood problems: Ongoing anxiety, depression is stable    Medication management with supportive therapy. Risks/benefits and SE of the medication discussed. Pt verbalized understanding and verbal consent obtained for treatment.  Affirm with the patient that the medications are taken as ordered. Patient expressed understanding of how their medications were to be used.    Meds: increase Effexor XR 225 mg p.o. daily for MDD and GAD Seroquel 150 mg p.o. nightly for MDD, GAD and insomnia Wellbutrin XL 150 mg p.o. daily for MDD and sexual side effects related to SNRI use     Labs:  None ordered today   Therapy: brief supportive therapy provided. Discussed psychosocial stressors in detail.       Consultations: pt no longer in therapy Encouraged to follow up with PCP as needed   Pt denies SI and is at an acute low risk for suicide. Patient told to call clinic if any problems occur. Patient advised to go to ER if they should develop SI/HI, side effects, or if symptoms worsen. Has crisis numbers to call if needed. Pt verbalized understanding.   F/up in 3 months or sooner if needed    [name], MD 04/28/2018, 3:12 PM

## 2018-05-09 ENCOUNTER — Ambulatory Visit
Admission: RE | Admit: 2018-05-09 | Discharge: 2018-05-09 | Disposition: A | Discharge status: Home / Self Care | Payer: 59 | Source: Ambulatory Visit | Attending: Emergency Medical Services | Admitting: Emergency Medical Services

## 2018-05-09 DIAGNOSIS — Z1231 Encounter for screening mammogram for malignant neoplasm of breast: Secondary | ICD-10-CM | POA: Diagnosis not present

## 2018-06-14 ENCOUNTER — Telehealth: Payer: Self-pay | Admitting: *Deleted

## 2018-06-14 NOTE — Telephone Encounter (Signed)
Pt has self quarantined herself x 14 days due to cold symptoms.  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  She is calling for 2 reasons:  1. She will need a letter to start back  2. She wants to make sure it will be ok for her to go back as she has HTN.  Christen Bame, CMA

## 2018-06-15 NOTE — Telephone Encounter (Signed)
  Work note written, sent to patient via my-chart.  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  Lucila Maine, DO PGY-3, [facility] 06/15/2018 9:46 AM

## 2018-06-19 ENCOUNTER — Encounter: Payer: Self-pay | Admitting: Family Medicine

## 2018-06-20 ENCOUNTER — Telehealth (HOSPITAL_COMMUNITY): Payer: Self-pay

## 2018-06-20 NOTE — Telephone Encounter (Signed)
Patient is calling to report increased anxiety, she would like an increase on anxiety medication - Please review and advise, thank you

## 2018-06-23 ENCOUNTER — Ambulatory Visit (HOSPITAL_COMMUNITY): Payer: 59 | Admitting: Psychiatry

## 2018-06-23 ENCOUNTER — Other Ambulatory Visit: Payer: Self-pay

## 2018-06-23 DIAGNOSIS — F99 Mental disorder, not otherwise specified: Secondary | ICD-10-CM

## 2018-06-23 DIAGNOSIS — F411 Generalized anxiety disorder: Secondary | ICD-10-CM

## 2018-06-23 DIAGNOSIS — F422 Mixed obsessional thoughts and acts: Secondary | ICD-10-CM

## 2018-06-23 DIAGNOSIS — F5105 Insomnia due to other mental disorder: Secondary | ICD-10-CM

## 2018-06-23 DIAGNOSIS — F331 Major depressive disorder, recurrent, moderate: Secondary | ICD-10-CM

## 2018-06-23 MED ORDER — VENLAFAXINE HCL ER 75 MG PO CP24: 225.0000 mg | ORAL_CAPSULE | Freq: Every day | ORAL | 0 refills | Status: DC

## 2018-06-23 MED ORDER — BUPROPION HCL ER (XL) 150 MG PO TB24: 150.0000 mg | ORAL_TABLET | Freq: Every day | ORAL | 0 refills | Status: DC

## 2018-06-23 MED ORDER — QUETIAPINE FUMARATE 200 MG PO TABS: 200.0000 mg | ORAL_TABLET | Freq: Every day | ORAL | 0 refills | Status: DC

## 2018-06-23 MED ORDER — QUETIAPINE FUMARATE 25 MG PO TABS: 25.0000 mg | ORAL_TABLET | Freq: Two times a day (BID) | ORAL | 2 refills | Status: DC | PRN

## 2018-06-23 NOTE — Progress Notes (Unsigned)
Virtual Visit via Telephone Note  I connected with Wendy Duffy on 06/23/18 at  3:15 PM EDT by telephone and verified that I am speaking with the correct person using two identifiers.   I discussed the limitations, risks, security and privacy concerns of performing an evaluation and management service by telephone and the availability of in person appointments. I also discussed with the patient that there may be a patient responsible charge related to this service. The patient expressed understanding and agreed to proceed  07/14/2018 8:28 AM Wendy Duffy  MRN:  [id number]  Chief Complaint:   HPI:   ***Patient reports that she is doing well.  She visited family over the holidays and states it was a very relaxing vacation for her.  She continues to work and is taking some online classes.  She finds herself procrastinating to the last minute to get her assignments done which causes her a lot of stress and anxiety.  She reports racing thoughts with tremors.  Managing her family's needs and work and school has been overwhelming.  She is denying any depression symptoms.  Her sleep is on the poor side.  She is denying SI/HI.    ICD-10-CM   1. MDD (major depressive disorder), recurrent episode, moderate (HCC) F33.1 buPROPion (WELLBUTRIN XL) 150 MG 24 hr tablet    QUEtiapine (SEROQUEL) 200 MG tablet    venlafaxine XR (EFFEXOR-XR) 75 MG 24 hr capsule  2. GAD (generalized anxiety disorder) F41.1 QUEtiapine (SEROQUEL) 200 MG tablet    venlafaxine XR (EFFEXOR-XR) 75 MG 24 hr capsule    QUEtiapine (SEROQUEL) 25 MG tablet  3. Insomnia due to other mental disorder F51.05 QUEtiapine (SEROQUEL) 200 MG tablet   F99   4. Mixed obsessional thoughts and acts F42.2 venlafaxine XR (EFFEXOR-XR) 75 MG 24 hr capsule      Past Psychiatric History:  Anxiety: Yes Bipolar Disorder: No Depression: Yes Mania: No Psychosis: No Schizophrenia: No Personality Disorder: No Hospitalization for psychiatric  illness: No History of Electroconvulsive Shock Therapy: No Prior Suicide Attempts: Yes     Past Medical History:  Past Medical History:  Diagnosis Date  . Anemia   . Anxiety   . Depression   . Gastritis   . Hemorrhoids   . Hypertension   . Sleep apnea    questionable refered for sleep study will schedule appointment   . Status post laparoscopic hysterectomy 10/21/2015   TLH with BSO    Past Surgical History:  Procedure Laterality Date  . BILATERAL SALPINGECTOMY Bilateral 10/21/2015   Procedure: BILATERAL SALPINGECTOMY;  Surgeon: Sherlyn Hay, DO;  Location: [facility];  Service: Gynecology;  Laterality: Bilateral;  . [place], 2003  . LAPAROSCOPIC HYSTERECTOMY N/A 10/21/2015   Procedure: HYSTERECTOMY TOTAL LAPAROSCOPIC;  Surgeon: Sherlyn Hay, DO;  Location: [facility];  Service: Gynecology;  Laterality: N/A;  . WISDOM TOOTH EXTRACTION      Family Psychiatric History:  Family History  Problem Relation Age of Onset  . Alcohol abuse Mother   . Drug abuse Maternal Uncle   . Alcohol abuse Maternal Grandmother   . Drug abuse Maternal Grandmother     Social History:  Social History   Socioeconomic History  . Marital status: Married    Spouse name: Not on file  . Number of children: Not on file  . Years of education: Not on file  . Highest education level: Not on file  Occupational History  . Occupation: Print production planner  Social Needs  .  Financial resource strain: Not on file  . Food insecurity:    Worry: Not on file    Inability: Not on file  . Transportation needs:    Medical: Not on file    Non-medical: Not on file  Tobacco Use  . Smoking status: Never Smoker  . Smokeless tobacco: Never Used  Substance and Sexual Activity  . Alcohol use: Yes    Alcohol/week: 3.0 standard drinks    Types: 1 Glasses of wine, 2 Cans of beer per week    Comment: weekly  . Drug use: No    Types: Marijuana    Comment: last used THC in November 2015. using a  few times a year  . Sexual activity: Yes    Birth control/protection: None  Lifestyle  . Physical activity:    Days per week: Not on file    Minutes per session: Not on file  . Stress: Not on file  Relationships  . Social connections:    Talks on phone: Not on file    Gets together: Not on file    Attends religious service: Not on file    Active member of club or organization: Not on file    Attends meetings of clubs or organizations: Not on file    Relationship status: Not on file  Other Topics Concern  . Not on file  Social History Narrative  . Not on file    Allergies:  Allergies  Allergen Reactions  . Zoloft [Sertraline Hcl]     Made her feel very anxious     Metabolic Disorder Labs: Lab Results  Component Value Date   HGBA1C 4.6 (L) 09/30/2017   MPG 80 09/17/2016   MPG 108 05/23/2015   Lab Results  Component Value Date   PROLACTIN 6.6 09/30/2017   PROLACTIN 6.8 09/17/2016   Lab Results  Component Value Date   CHOL 171 09/30/2017   TRIG 148 09/30/2017   HDL 88 09/30/2017   CHOLHDL 1.7 09/17/2016   VLDL 24 09/17/2016   LDLCALC 53 09/30/2017   LDLCALC 38 09/17/2016   Lab Results  Component Value Date   TSH 0.435 (L) 09/30/2017   TSH 0.58 09/17/2016    Therapeutic Level Labs: No results found for: LITHIUM No results found for: VALPROATE No components found for:  CBMZ  Current Medications: Current Outpatient Medications  Medication Sig Dispense Refill  . amLODipine (NORVASC) 10 MG tablet TAKE ONE TABLET BY MOUTH DAILY 30 tablet 3  . buPROPion (WELLBUTRIN XL) 150 MG 24 hr tablet Take 1 tablet (150 mg total) by mouth daily. 90 tablet 0  . cetirizine (ZYRTEC) 10 MG tablet Take 1 tablet (10 mg total) by mouth daily as needed for allergies. 30 tablet 2  . fluticasone (FLONASE) 50 MCG/ACT nasal spray Place 2 sprays into both nostrils daily. 16 g 6  . hydrochlorothiazide (HYDRODIURIL) 25 MG tablet TAKE ONE TABLET BY MOUTH DAILY 90 tablet 3  . ibuprofen  (ADVIL,MOTRIN) 800 MG tablet Take 1 tablet (800 mg total) by mouth every 6 (six) hours as needed for mild pain, moderate pain or cramping. 40 tablet 1  . losartan (COZAAR) 100 MG tablet TAKE ONE TABLET BY MOUTH DAILY 90 tablet 0  . omeprazole (PRILOSEC) 40 MG capsule take 1 capsule by mouth once daily 90 capsule 6  . QUEtiapine (SEROQUEL) 200 MG tablet Take 1 tablet (200 mg total) by mouth at bedtime. 90 tablet 0  . QUEtiapine (SEROQUEL) 25 MG tablet Take 1 tablet (25 mg  total) by mouth 2 (two) times daily as needed (anxiety). 60 tablet 2  . venlafaxine XR (EFFEXOR-XR) 75 MG 24 hr capsule Take 3 capsules (225 mg total) by mouth daily. 270 capsule 0   No current facility-administered medications for this visit.      Psychiatric Specialty Exam: ROS  Last menstrual period 09/20/2015.There is no height or weight on file to calculate BMI.  General Appearance: {Appearance:22683}  Eye Contact:  {BHH EYE CONTACT:22684}  Speech:  {Speech:22685}  Volume:  {Volume (PAA):22686}  Mood:  {BHH MOOD:22306}  Affect:  {Affect (PAA):22687}  Thought Process:  {Thought Process (PAA):22688}  Orientation:  {BHH ORIENTATION (PAA):22689}  Thought Content:  {Thought Content:22690}  Suicidal Thoughts:  {ST/HT (PAA):22692}  Homicidal Thoughts:  {ST/HT (PAA):22692}  Memory:  {BHH MEMORY:22881}  Judgement:  {Judgement (PAA):22694}  Insight:  {Insight (PAA):22695}  Psychomotor Activity:  {Psychomotor (PAA):22696}  Concentration:  {Concentration:21399}  Recall:  {BHH GOOD/FAIR/POOR:22877}  Fund of Knowledge:  {BHH GOOD/FAIR/POOR:22877}  Language:  {BHH GOOD/FAIR/POOR:22877}  Akathisia:  {BHH YES OR NO:22294}  Handed:  {Handed:22697}  AIMS (if indicated):     Assets:  {Assets (PAA):22698}  ADL's:  {BHH VQQ'V:95638}  Cognition:  {chl bhh cognition:304700322}  Sleep:            Screenings: PHQ2-9     Office Visit from 07/09/2017 in [facility] Office Visit from 03/24/2017 in [facility] Office Visit from 11/12/2016 in [facility] Office Visit from 08/20/2016 in [facility] Office Visit from 07/20/2016 in [facility]  PHQ-2 Total Score  0  0  0  0  0      I reviewed the information below on 04/28/2018 and have updated it Assessment and Plan:  MDD-recurrent, moderate; GAD; Insomnia; OCD   Status of current mood problems: Ongoing anxiety, depression is stable    Medication management with supportive therapy. Risks/benefits and SE of the medication discussed. Pt verbalized understanding and verbal consent obtained for treatment.  Affirm with the patient that the medications are taken as ordered. Patient expressed understanding of how their medications were to be used.    Meds: increase Effexor XR 225 mg p.o. daily for MDD and GAD Seroquel 150 mg p.o. nightly for MDD, GAD and insomnia Wellbutrin XL 150 mg p.o. daily for MDD and sexual side effects related to SNRI use     Labs:  None ordered today   Therapy: brief supportive therapy provided. Discussed psychosocial stressors in detail.       Consultations: pt no longer in therapy Encouraged to follow up with PCP as needed   Pt denies SI and is at an acute low risk for suicide. Patient told to call clinic if any problems occur. Patient advised to go to ER if they should develop SI/HI, side effects, or if symptoms worsen. Has crisis numbers to call if needed. Pt verbalized understanding.   F/up on April 30th, 2020 or sooner if needed  I provided 20 minutes of non-face-to-face time during this encounter.  [name], MD 07/14/2018, 8:28 AM

## 2018-06-30 NOTE — Telephone Encounter (Signed)
Patient was seen on 4/9 and this was addressed

## 2018-07-14 ENCOUNTER — Ambulatory Visit (HOSPITAL_COMMUNITY): Payer: 59 | Admitting: Psychiatry

## 2018-07-14 ENCOUNTER — Emergency Department (HOSPITAL_COMMUNITY)
Admission: EM | Admit: 2018-07-14 | Discharge: 2018-07-15 | Disposition: A | Discharge status: Other Acute Inpatient Hospital | Payer: 59 | Source: Home / Self Care | Attending: Emergency Medicine | Admitting: Emergency Medicine

## 2018-07-14 ENCOUNTER — Other Ambulatory Visit: Payer: Self-pay

## 2018-07-14 DIAGNOSIS — I10 Essential (primary) hypertension: Secondary | ICD-10-CM | POA: Insufficient documentation

## 2018-07-14 DIAGNOSIS — F1290 Cannabis use, unspecified, uncomplicated: Secondary | ICD-10-CM | POA: Diagnosis present

## 2018-07-14 DIAGNOSIS — K219 Gastro-esophageal reflux disease without esophagitis: Secondary | ICD-10-CM | POA: Diagnosis present

## 2018-07-14 DIAGNOSIS — Z811 Family history of alcohol abuse and dependence: Secondary | ICD-10-CM | POA: Diagnosis not present

## 2018-07-14 DIAGNOSIS — Z90710 Acquired absence of both cervix and uterus: Secondary | ICD-10-CM | POA: Diagnosis not present

## 2018-07-14 DIAGNOSIS — Z79899 Other long term (current) drug therapy: Secondary | ICD-10-CM | POA: Insufficient documentation

## 2018-07-14 DIAGNOSIS — F314 Bipolar disorder, current episode depressed, severe, without psychotic features: Principal | ICD-10-CM

## 2018-07-14 DIAGNOSIS — F332 Major depressive disorder, recurrent severe without psychotic features: Secondary | ICD-10-CM

## 2018-07-14 DIAGNOSIS — F10920 Alcohol use, unspecified with intoxication, uncomplicated: Secondary | ICD-10-CM

## 2018-07-14 DIAGNOSIS — F1010 Alcohol abuse, uncomplicated: Secondary | ICD-10-CM | POA: Diagnosis present

## 2018-07-14 DIAGNOSIS — F419 Anxiety disorder, unspecified: Secondary | ICD-10-CM | POA: Diagnosis present

## 2018-07-14 DIAGNOSIS — Y906 Blood alcohol level of 120-199 mg/100 ml: Secondary | ICD-10-CM | POA: Insufficient documentation

## 2018-07-14 DIAGNOSIS — F339 Major depressive disorder, recurrent, unspecified: Secondary | ICD-10-CM | POA: Diagnosis not present

## 2018-07-14 DIAGNOSIS — Z8719 Personal history of other diseases of the digestive system: Secondary | ICD-10-CM | POA: Diagnosis not present

## 2018-07-14 MED ORDER — ONDANSETRON 4 MG PO TBDP
4.0000 mg | ORAL_TABLET | Freq: Once | ORAL | Status: AC
  Administered 2018-07-15: 4 mg via ORAL
  Filled 2018-07-14: qty 1

## 2018-07-14 NOTE — ED Provider Notes (Signed)
[facility] DEPT Provider Note   CSN: [id number] Arrival date & time: 07/14/18  2115    History   Chief Complaint Chief Complaint  Patient presents with  . Anxiety    HPI Wendy Duffy is a 43 y.o. female with a history of major depressive disorder, anxiety, HTN, OSA, and hemorrhoids who presents to the emergency department by EMS with a chief complaint of "My cousin died today."  The patient reports she found out this afternoon that her cousin passed away today.  She reports that she was taking shots of vodka with her family prior to arrival.  She estimates she had 3 shots.  She thinks she may have vomited once.  She reports that while she was drinking with her family that the daughter of the family member that passed away "became upset and asked them not to mourn."  In the room, the patient is tearful and states that she is very sad right now.  She does not have the contact information for the cousin who called EMS.  Per nursing staff that took the EMS in code, the patient's family member called EMS after the patient had a panic attack while drinking alcohol.  The patient reports that she does not have the phone number of the family member who made the call because she does not have her phone with her.  She denies SI, HI, or auditory visual hallucinations at this time.  She reports feeling somewhat lightheaded from drinking alcohol, but otherwise has no complaints.  She denies cough, shortness of breath, sore throat, chest pain, abdominal pain, fever, or chills.  Per chart review, the patient was seen by behavioral health earlier this morning for a scheduled visit where she endorsed racing thoughts with tremors, feeling overwhelmed by managing her family's needs, work, and school, poor sleep hygiene, and procrastinating to the last minute to get assignments done for school, which was causing her a lot of stress and anxiety.  She is a non-smoker.  She denies  IV or recreational drug use.        The history is provided by the patient. No language interpreter was used.    Past Medical History:  Diagnosis Date  . Anemia   . Anxiety   . Depression   . Gastritis   . Hemorrhoids   . Hypertension   . Sleep apnea    questionable refered for sleep study will schedule appointment   . Status post laparoscopic hysterectomy 10/21/2015   TLH with BSO    Patient Active Problem List   Diagnosis Date Noted  . Cough 03/24/2017  . Scabies infestation 11/12/2016  . Back pain 03/30/2016  . Status post laparoscopic hysterectomy 10/21/2015  . Anemia 09/20/2015  . Pap smear abnormality of cervix/human papillomavirus (HPV) positive 04/09/2015  . MDD (major depressive disorder), recurrent episode, moderate ([place]) 01/15/2015  . GAD (generalized anxiety disorder) 01/15/2015  . Migraine 01/31/2014  . Birth control 06/14/2012  . Depression 04/06/2012  . Viral URI with cough 07/14/2011  . Preventative health care 12/12/2010  . Anxiety 05/21/2010  . Disturbance in sleep behavior 10/01/2009  . VENTRICULAR HYPERTROPHY, LEFT 06/15/2007  . HYPERTENSION, BENIGN 05/26/2006  . OBESITY, NOS 05/13/2006    Past Surgical History:  Procedure Laterality Date  . BILATERAL SALPINGECTOMY Bilateral 10/21/2015   Procedure: BILATERAL SALPINGECTOMY;  Surgeon: Sherlyn Hay, DO;  Location: [facility];  Service: Gynecology;  Laterality: Bilateral;  . [place], 2003  . LAPAROSCOPIC HYSTERECTOMY  N/A 10/21/2015   Procedure: HYSTERECTOMY TOTAL LAPAROSCOPIC;  Surgeon: Sherlyn Hay, DO;  Location: [facility];  Service: Gynecology;  Laterality: N/A;  . WISDOM TOOTH EXTRACTION       OB History    Gravida  6   Para  3   Term  3   Preterm  0   AB  3   Living  3     SAB      TAB      Ectopic      Multiple      Live Births               Home Medications    Prior to Admission medications   Medication Sig Start Date End Date Taking?  Authorizing Provider  amLODipine (NORVASC) 10 MG tablet TAKE ONE TABLET BY MOUTH DAILY Patient taking differently: Take 10 mg by mouth daily.  12/20/17  Yes Riccio, Angela C, DO  buPROPion (WELLBUTRIN XL) 150 MG 24 hr tablet Take 1 tablet (150 mg total) by mouth daily. 06/23/18  Yes [name], MD  cetirizine (ZYRTEC) 10 MG tablet Take 1 tablet (10 mg total) by mouth daily as needed for allergies. 07/20/16  Yes Phelps, Aviva Signs Y, DO  fluticasone (FLONASE) 50 MCG/ACT nasal spray Place 2 sprays into both nostrils daily. Patient taking differently: Place 2 sprays into both nostrils daily as needed for allergies.  07/20/16  Yes Phelps, Aviva Signs Y, DO  hydrochlorothiazide (HYDRODIURIL) 25 MG tablet TAKE ONE TABLET BY MOUTH DAILY Patient taking differently: Take 25 mg by mouth daily.  01/24/18  Yes Riccio, Angela C, DO  ibuprofen (ADVIL,MOTRIN) 800 MG tablet Take 1 tablet (800 mg total) by mouth every 6 (six) hours as needed for mild pain, moderate pain or cramping. 10/22/15  Yes Banga, Cecilia Worema, DO  losartan (COZAAR) 100 MG tablet TAKE ONE TABLET BY MOUTH DAILY Patient taking differently: Take 100 mg by mouth daily.  10/13/17  Yes Riccio, Gardiner Rhyme, DO  omeprazole (PRILOSEC) 40 MG capsule take 1 capsule by mouth once daily Patient taking differently: Take 40 mg by mouth daily.  07/28/13  Yes [place], Tyler Pita, MD  QUEtiapine (SEROQUEL) 200 MG tablet Take 1 tablet (200 mg total) by mouth at bedtime. 06/23/18 06/23/19 Yes [name], MD  venlafaxine XR (EFFEXOR-XR) 150 MG 24 hr capsule Take 150 mg by mouth daily after breakfast.  06/12/18  Yes [provider]  QUEtiapine (SEROQUEL) 25 MG tablet Take 1 tablet (25 mg total) by mouth 2 (two) times daily as needed (anxiety). Patient not taking: Reported on 07/15/2018 06/23/18 06/23/19  [name], MD  venlafaxine XR (EFFEXOR-XR) 75 MG 24 hr capsule Take 3 capsules (225 mg total) by mouth daily. Patient not taking: Reported on 07/15/2018 06/23/18   [name], MD    Family History Family History  Problem Relation Age of Onset  . Alcohol abuse Mother   . Drug abuse Maternal Uncle   . Alcohol abuse Maternal Grandmother   . Drug abuse Maternal Grandmother     Social History Social History   Tobacco Use  . Smoking status: Never Smoker  . Smokeless tobacco: Never Used  Substance Use Topics  . Alcohol use: Yes    Alcohol/week: 3.0 standard drinks    Types: 1 Glasses of wine, 2 Cans of beer per week    Comment: weekly  . Drug use: No    Types: Marijuana    Comment: last used THC in November 2015. using a  few times a year     Allergies   Zoloft [sertraline hcl]   Review of Systems Review of Systems  Constitutional: Negative for activity change.  Respiratory: Negative for shortness of breath.   Cardiovascular: Negative for chest pain.  Gastrointestinal: Negative for abdominal pain.  Genitourinary: Negative for dysuria.  Musculoskeletal: Negative for back pain.  Skin: Negative for rash.  Allergic/Immunologic: Negative for immunocompromised state.  Neurological: Positive for light-headedness. Negative for headaches.  Psychiatric/Behavioral: Positive for dysphoric mood and sleep disturbance. Negative for confusion, hallucinations and suicidal ideas. The patient is nervous/anxious.     Physical Exam Updated Vital Signs BP (!) 143/99 (BP Location: Left Arm)   Pulse 95   Temp 98.3 F (36.8 C) (Oral)   Resp 18   LMP 09/20/2015 (Exact Date)   SpO2 97%   Physical Exam Vitals signs and nursing note reviewed.  Constitutional:      General: She is not in acute distress.    Appearance: She is obese. She is not ill-appearing, toxic-appearing or diaphoretic.  HENT:     Head: Normocephalic.  Eyes:     General: No scleral icterus.    Extraocular Movements: Extraocular movements intact.     Conjunctiva/sclera: Conjunctivae normal.     Pupils: Pupils are equal, round, and reactive to light.  Neck:     Musculoskeletal: Neck  supple.  Cardiovascular:     Rate and Rhythm: Normal rate and regular rhythm.     Heart sounds: No murmur. No friction rub. No gallop.   Pulmonary:     Effort: Pulmonary effort is normal. No respiratory distress.     Breath sounds: No stridor. No wheezing, rhonchi or rales.  Chest:     Chest wall: No tenderness.  Abdominal:     General: There is no distension.     Palpations: Abdomen is soft. There is no mass.     Tenderness: There is no abdominal tenderness. There is no right CVA tenderness, left CVA tenderness, guarding or rebound.     Hernia: No hernia is present.  Skin:    General: Skin is warm.     Capillary Refill: Capillary refill takes less than 2 seconds.     Findings: No rash.  Neurological:     General: No focal deficit present.     Mental Status: She is alert.     Comments: Speech is not slurred.  Answers all questions in complete, fluent sentences.  Psychiatric:        Attention and Perception: She does not perceive auditory or visual hallucinations.        Mood and Affect: Mood is depressed. Affect is tearful.        Speech: Speech normal.        Behavior: Behavior is slowed. Behavior is cooperative.        Thought Content: Thought content does not include homicidal or suicidal ideation. Thought content does not include homicidal or suicidal plan.      ED Treatments / Results  Labs (all labs ordered are listed, but only abnormal results are displayed) Labs Reviewed  CBC WITH DIFFERENTIAL/PLATELET - Abnormal; Notable for the following components:      Result Value   Hemoglobin 15.5 (*)    HCT 48.8 (*)    All other components within normal limits  COMPREHENSIVE METABOLIC PANEL  ETHANOL  RAPID URINE DRUG SCREEN, HOSP PERFORMED  SALICYLATE LEVEL  ACETAMINOPHEN LEVEL    EKG None  Radiology No results found.  Procedures Procedures (  including critical care time)  Medications Ordered in ED Medications  ondansetron (ZOFRAN-ODT) disintegrating tablet 4  mg (4 mg Oral Given 07/15/18 0005)     Initial Impression / Assessment and Plan / ED Course  I have reviewed the triage vital signs and the nursing notes.  Pertinent labs & imaging results that were available during my care of the patient were reviewed by me and considered in my medical decision making (see chart for details).  43 year old female with a history of major depressive disorder, anxiety, HTN, OSA, and hemorrhoids presenting by EMS from home.  I spoke with the patient's husband and he reports that family called after the patient appeared to have had a panic attack.  She is having no complaints at this time.  She is tearful.  She lost a family member this afternoon.  Her medical record, including her most recent visit with behavioral health earlier today, has been reviewed.  She does deny SI, HI, or auditory visual hallucinations at this time.  However, the patient has a longstanding history of depression and anxiety.  Given recent stressor, will have the patient assessed by TTS.  Clinical Course as of Jul 14 417  Fri Jul 15, 2018  2876 Received permission from the patient to contact her husband, Wendy Duffy, with an update.  I have spoken with [facility] and discussed that the patient will be undergoing TTS evaluation.  He has requested an update when available.   [MM]  0140 Discussed recommendation for inpatient admission with the patient.  She was agreeable to giving her husband and update with recommendation for admission.  Psych labs have been ordered and are pending.   [MM]    Clinical Course User Index [MM] Sasha Rueth A, PA-C    Labs are reassuring although salicylate and Tylenol levels are pending.  Low suspicion for salicylate or Tylenol toxicity at this time.  Patient is medically cleared.  TTS recommends inpatient disposition. Psych hold orders placed. Please see psych team notes for further documentation of care/dispo. Pt stable at time of med clearance.       Final Clinical  Impressions(s) / ED Diagnoses   Final diagnoses:  Severe episode of recurrent major depressive disorder, without psychotic features ([place])  Alcoholic intoxication without complication [facility])    ED Discharge Orders    None       Estera Ozier A, PA-C 07/15/18 [place], [place], DO 07/15/18 1503

## 2018-07-14 NOTE — ED Triage Notes (Signed)
Patient reportedly lost her cousin today and worked through her anxiety earlier. Pt reportedly then went home and started drinking vodka with her family and is now intoxicated and reportedly anxious.  Pt is alert and oriented x4 per EMS.

## 2018-07-14 NOTE — ED Notes (Signed)
Urine specimen requested 

## 2018-07-14 NOTE — Progress Notes (Unsigned)
[place] MD/PA/NP OP Progress Note  07/14/2018 8:11 AM Wendy Duffy  MRN:  [id number]  Chief Complaint:   HPI: Patient reports that she is doing well.  She visited family over the holidays and states it was a very relaxing vacation for her.  She continues to work and is taking some online classes.  She finds herself procrastinating to the last minute to get her assignments done which causes her a lot of stress and anxiety.  She reports racing thoughts with tremors.  Managing her family's needs and work and school has been overwhelming.  She is denying any depression symptoms.  Her sleep is on the poor side.  She is denying SI/HI.  No diagnosis found.    Past Psychiatric History:  Anxiety: Yes Bipolar Disorder: No Depression: Yes Mania: No Psychosis: No Schizophrenia: No Personality Disorder: No Hospitalization for psychiatric illness: No History of Electroconvulsive Shock Therapy: No Prior Suicide Attempts: Yes     Past Medical History:  Past Medical History:  Diagnosis Date  . Anemia   . Anxiety   . Depression   . Gastritis   . Hemorrhoids   . Hypertension   . Sleep apnea    questionable refered for sleep study will schedule appointment   . Status post laparoscopic hysterectomy 10/21/2015   TLH with BSO    Past Surgical History:  Procedure Laterality Date  . BILATERAL SALPINGECTOMY Bilateral 10/21/2015   Procedure: BILATERAL SALPINGECTOMY;  Surgeon: Sherlyn Hay, DO;  Location: [facility];  Service: Gynecology;  Laterality: Bilateral;  . [place], 2003  . LAPAROSCOPIC HYSTERECTOMY N/A 10/21/2015   Procedure: HYSTERECTOMY TOTAL LAPAROSCOPIC;  Surgeon: Sherlyn Hay, DO;  Location: [facility];  Service: Gynecology;  Laterality: N/A;  . WISDOM TOOTH EXTRACTION      Family Psychiatric History:  Family History  Problem Relation Age of Onset  . Alcohol abuse Mother   . Drug abuse Maternal Uncle   . Alcohol abuse Maternal Grandmother   . Drug abuse  Maternal Grandmother     Social History:  Social History   Socioeconomic History  . Marital status: Married    Spouse name: Not on file  . Number of children: Not on file  . Years of education: Not on file  . Highest education level: Not on file  Occupational History  . Occupation: Print production planner  Social Needs  . Financial resource strain: Not on file  . Food insecurity:    Worry: Not on file    Inability: Not on file  . Transportation needs:    Medical: Not on file    Non-medical: Not on file  Tobacco Use  . Smoking status: Never Smoker  . Smokeless tobacco: Never Used  Substance and Sexual Activity  . Alcohol use: Yes    Alcohol/week: 3.0 standard drinks    Types: 1 Glasses of wine, 2 Cans of beer per week    Comment: weekly  . Drug use: No    Types: Marijuana    Comment: last used THC in November 2015. using a few times a year  . Sexual activity: Yes    Birth control/protection: None  Lifestyle  . Physical activity:    Days per week: Not on file    Minutes per session: Not on file  . Stress: Not on file  Relationships  . Social connections:    Talks on phone: Not on file    Gets together: Not on file    Attends religious service:  Not on file    Active member of club or organization: Not on file    Attends meetings of clubs or organizations: Not on file    Relationship status: Not on file  Other Topics Concern  . Not on file  Social History Narrative  . Not on file    Allergies:  Allergies  Allergen Reactions  . Zoloft [Sertraline Hcl]     Made her feel very anxious     Metabolic Disorder Labs: Lab Results  Component Value Date   HGBA1C 4.6 (L) 09/30/2017   MPG 80 09/17/2016   MPG 108 05/23/2015   Lab Results  Component Value Date   PROLACTIN 6.6 09/30/2017   PROLACTIN 6.8 09/17/2016   Lab Results  Component Value Date   CHOL 171 09/30/2017   TRIG 148 09/30/2017   HDL 88 09/30/2017   CHOLHDL 1.7 09/17/2016   VLDL 24 09/17/2016    LDLCALC 53 09/30/2017   LDLCALC 38 09/17/2016   Lab Results  Component Value Date   TSH 0.435 (L) 09/30/2017   TSH 0.58 09/17/2016    Therapeutic Level Labs: No results found for: LITHIUM No results found for: VALPROATE No components found for:  CBMZ  Current Medications: Current Outpatient Medications  Medication Sig Dispense Refill  . amLODipine (NORVASC) 10 MG tablet TAKE ONE TABLET BY MOUTH DAILY 30 tablet 3  . buPROPion (WELLBUTRIN XL) 150 MG 24 hr tablet Take 1 tablet (150 mg total) by mouth daily. 90 tablet 0  . cetirizine (ZYRTEC) 10 MG tablet Take 1 tablet (10 mg total) by mouth daily as needed for allergies. 30 tablet 2  . fluticasone (FLONASE) 50 MCG/ACT nasal spray Place 2 sprays into both nostrils daily. 16 g 6  . hydrochlorothiazide (HYDRODIURIL) 25 MG tablet TAKE ONE TABLET BY MOUTH DAILY 90 tablet 3  . ibuprofen (ADVIL,MOTRIN) 800 MG tablet Take 1 tablet (800 mg total) by mouth every 6 (six) hours as needed for mild pain, moderate pain or cramping. 40 tablet 1  . losartan (COZAAR) 100 MG tablet TAKE ONE TABLET BY MOUTH DAILY 90 tablet 0  . omeprazole (PRILOSEC) 40 MG capsule take 1 capsule by mouth once daily 90 capsule 6  . QUEtiapine (SEROQUEL) 200 MG tablet Take 1 tablet (200 mg total) by mouth at bedtime. 90 tablet 0  . QUEtiapine (SEROQUEL) 25 MG tablet Take 1 tablet (25 mg total) by mouth 2 (two) times daily as needed (anxiety). 60 tablet 2  . venlafaxine XR (EFFEXOR-XR) 75 MG 24 hr capsule Take 3 capsules (225 mg total) by mouth daily. 270 capsule 0   No current facility-administered medications for this visit.      Musculoskeletal: Strength & Muscle Tone: within normal limits Gait & Station: normal Patient leans: N/A  Psychiatric Specialty Exam: Review of Systems  Constitutional: Negative for chills, diaphoresis and fever.  Psychiatric/Behavioral: Negative for depression and suicidal ideas. The patient is nervous/anxious. The patient does not have  insomnia.     Last menstrual period 09/20/2015.There is no height or weight on file to calculate BMI.  General Appearance: Fairly Groomed  Eye Contact:  Good  Speech:  Clear and Coherent and Normal Rate  Volume:  Normal  Mood:  Anxious  Affect:  Congruent  Thought Process:  Goal Directed and Descriptions of Associations: Intact  Orientation:  Full (Time, Place, and Person)  Thought Content:  Logical  Suicidal Thoughts:  No  Homicidal Thoughts:  No  Memory:  Immediate;   Good  Judgement:  Fair  Insight:  Good  Psychomotor Activity:  Tremor  Concentration:  Concentration: Good  Recall:  Good  Fund of Knowledge:  Good  Language:  Good  Akathisia:  No  Handed:  Right  AIMS (if indicated):     Assets:  Communication Skills Desire for Improvement Financial Resources/Insurance Housing Intimacy Leisure Time Resilience Social Support Talents/Skills Transportation Vocational/Educational  ADL's:  Intact  Cognition:  WNL  Sleep:   poor       Screenings: PHQ2-9     Office Visit from 07/09/2017 in [facility] Office Visit from 03/24/2017 in [facility] Office Visit from 11/12/2016 in [facility] Office Visit from 08/20/2016 in [facility] Office Visit from 07/20/2016 in [facility]  PHQ-2 Total Score  0  0  0  0  0      I reviewed the information below on 04/28/2018 and have updated it Assessment and Plan:  MDD-recurrent, moderate; GAD; Insomnia; OCD   Status of current mood problems: Ongoing anxiety, depression is stable    Medication management with supportive therapy. Risks/benefits and SE of the medication discussed. Pt verbalized understanding and verbal consent obtained for treatment.  Affirm with the patient that the medications are taken as ordered. Patient expressed understanding of how their medications were to be used.    Meds: increase Effexor XR 225 mg p.o.  daily for MDD and GAD Seroquel 150 mg p.o. nightly for MDD, GAD and insomnia Wellbutrin XL 150 mg p.o. daily for MDD and sexual side effects related to SNRI use     Labs:  None ordered today   Therapy: brief supportive therapy provided. Discussed psychosocial stressors in detail.       Consultations: pt no longer in therapy Encouraged to follow up with PCP as needed   Pt denies SI and is at an acute low risk for suicide. Patient told to call clinic if any problems occur. Patient advised to go to ER if they should develop SI/HI, side effects, or if symptoms worsen. Has crisis numbers to call if needed. Pt verbalized understanding.   F/up in 3 months or sooner if needed    [name], MD 07/14/2018, 8:11 AM

## 2018-07-14 NOTE — ED Notes (Signed)
Bed: [facility] Expected date:  Expected time:  Means of arrival:  Comments: 

## 2018-07-15 ENCOUNTER — Inpatient Hospital Stay (HOSPITAL_COMMUNITY)
Admission: AD | Admit: 2018-07-15 | Discharge: 2018-07-15 | DRG: 885 | Disposition: A | Discharge status: Home / Self Care | Payer: 59 | Source: Intra-hospital | Attending: Psychiatry | Admitting: Psychiatry

## 2018-07-15 ENCOUNTER — Encounter (HOSPITAL_COMMUNITY): Payer: Self-pay

## 2018-07-15 ENCOUNTER — Other Ambulatory Visit: Payer: Self-pay

## 2018-07-15 DIAGNOSIS — Z8719 Personal history of other diseases of the digestive system: Secondary | ICD-10-CM

## 2018-07-15 DIAGNOSIS — I10 Essential (primary) hypertension: Secondary | ICD-10-CM | POA: Diagnosis present

## 2018-07-15 DIAGNOSIS — F331 Major depressive disorder, recurrent, moderate: Secondary | ICD-10-CM

## 2018-07-15 DIAGNOSIS — Z79899 Other long term (current) drug therapy: Secondary | ICD-10-CM

## 2018-07-15 DIAGNOSIS — F332 Major depressive disorder, recurrent severe without psychotic features: Principal | ICD-10-CM | POA: Diagnosis present

## 2018-07-15 DIAGNOSIS — F419 Anxiety disorder, unspecified: Secondary | ICD-10-CM | POA: Diagnosis present

## 2018-07-15 DIAGNOSIS — F1010 Alcohol abuse, uncomplicated: Secondary | ICD-10-CM | POA: Diagnosis present

## 2018-07-15 DIAGNOSIS — Z90710 Acquired absence of both cervix and uterus: Secondary | ICD-10-CM

## 2018-07-15 DIAGNOSIS — K219 Gastro-esophageal reflux disease without esophagitis: Secondary | ICD-10-CM | POA: Diagnosis present

## 2018-07-15 DIAGNOSIS — Z811 Family history of alcohol abuse and dependence: Secondary | ICD-10-CM

## 2018-07-15 DIAGNOSIS — F1290 Cannabis use, unspecified, uncomplicated: Secondary | ICD-10-CM | POA: Diagnosis present

## 2018-07-15 DIAGNOSIS — F339 Major depressive disorder, recurrent, unspecified: Secondary | ICD-10-CM | POA: Diagnosis present

## 2018-07-15 LAB — CBC WITH DIFFERENTIAL/PLATELET
Abs Immature Granulocytes: 0.02 10*3/uL (ref 0.00–0.07)
Basophils Absolute: 0 10*3/uL (ref 0.0–0.1)
Basophils Relative: 1 %
Eosinophils Absolute: 0.3 10*3/uL (ref 0.0–0.5)
Eosinophils Relative: 5 %
HCT: 48.8 % — ABNORMAL HIGH (ref 36.0–46.0)
Hemoglobin: 15.5 g/dL — ABNORMAL HIGH (ref 12.0–15.0)
Immature Granulocytes: 0 %
Lymphocytes Relative: 35 %
Lymphs Abs: 2 10*3/uL (ref 0.7–4.0)
MCH: 30.6 pg (ref 26.0–34.0)
MCHC: 31.8 g/dL (ref 30.0–36.0)
MCV: 96.4 fL (ref 80.0–100.0)
Monocytes Absolute: 0.2 10*3/uL (ref 0.1–1.0)
Monocytes Relative: 4 %
Neutro Abs: 3.2 10*3/uL (ref 1.7–7.7)
Neutrophils Relative %: 55 %
Platelets: 398 10*3/uL (ref 150–400)
RBC: 5.06 MIL/uL (ref 3.87–5.11)
RDW: 13 % (ref 11.5–15.5)
WBC: 5.8 10*3/uL (ref 4.0–10.5)
nRBC: 0 % (ref 0.0–0.2)

## 2018-07-15 LAB — COMPREHENSIVE METABOLIC PANEL
ALT: 28 U/L (ref 0–44)
AST: 24 U/L (ref 15–41)
Albumin: 4.3 g/dL (ref 3.5–5.0)
Alkaline Phosphatase: 63 U/L (ref 38–126)
Anion gap: 12 (ref 5–15)
BUN: 13 mg/dL (ref 6–20)
CO2: 23 mmol/L (ref 22–32)
Calcium: 9.2 mg/dL (ref 8.9–10.3)
Chloride: 106 mmol/L (ref 98–111)
Creatinine, Ser: 0.79 mg/dL (ref 0.44–1.00)
GFR calc Af Amer: >60 mL/min (ref >60)
GFR calc non Af Amer: >60 mL/min (ref >60)
Glucose, Bld: 88 mg/dL (ref 70–99)
Potassium: 3.6 mmol/L (ref 3.5–5.1)
Sodium: 141 mmol/L (ref 135–145)
Total Bilirubin: 0.7 mg/dL (ref 0.3–1.2)
Total Protein: 7.8 g/dL (ref 6.5–8.1)

## 2018-07-15 LAB — ETHANOL: Alcohol, Ethyl (B): 156 mg/dL — ABNORMAL HIGH (ref <10)

## 2018-07-15 LAB — SALICYLATE LEVEL: Salicylate Lvl: <7 mg/dL (ref 2.8–30.0)

## 2018-07-15 LAB — ACETAMINOPHEN LEVEL: Acetaminophen (Tylenol), Serum: <10 ug/mL — ABNORMAL LOW (ref 10–30)

## 2018-07-15 MED ORDER — BUPROPION HCL ER (XL) 300 MG PO TB24: 300.0000 mg | ORAL_TABLET | Freq: Every day | ORAL | 1 refills | Status: DC

## 2018-07-15 MED ORDER — LORAZEPAM 1 MG PO TABS: 1.0000 mg | ORAL_TABLET | Freq: Three times a day (TID) | ORAL | Status: DC

## 2018-07-15 MED ORDER — LORAZEPAM 1 MG PO TABS
1.0000 mg | ORAL_TABLET | Freq: Four times a day (QID) | ORAL | Status: DC
  Administered 2018-07-15 (×2): 1 mg via ORAL
  Filled 2018-07-15 (×2): qty 1

## 2018-07-15 MED ORDER — LOPERAMIDE HCL 2 MG PO CAPS: 2.0000 mg | ORAL_CAPSULE | ORAL | Status: DC | PRN

## 2018-07-15 MED ORDER — TRAZODONE HCL 150 MG PO TABS: 50.0000 mg | ORAL_TABLET | Freq: Every evening | ORAL | 2 refills | Status: DC | PRN

## 2018-07-15 MED ORDER — VITAMIN B-1 100 MG PO TABS
100.0000 mg | ORAL_TABLET | Freq: Every day | ORAL | Status: DC
  Filled 2018-07-15 (×2): qty 1

## 2018-07-15 MED ORDER — MAGNESIUM HYDROXIDE 400 MG/5ML PO SUSP: 30.0000 mL | Freq: Every day | ORAL | Status: DC | PRN

## 2018-07-15 MED ORDER — ACETAMINOPHEN 325 MG PO TABS
650.0000 mg | ORAL_TABLET | Freq: Four times a day (QID) | ORAL | Status: DC | PRN
  Administered 2018-07-15: 650 mg via ORAL
  Filled 2018-07-15: qty 2

## 2018-07-15 MED ORDER — ONDANSETRON 4 MG PO TBDP
4.0000 mg | ORAL_TABLET | Freq: Four times a day (QID) | ORAL | Status: DC | PRN
  Administered 2018-07-15: 4 mg via ORAL
  Filled 2018-07-15: qty 1

## 2018-07-15 MED ORDER — HYDROXYZINE HCL 25 MG PO TABS: 25.0000 mg | ORAL_TABLET | Freq: Four times a day (QID) | ORAL | Status: DC | PRN

## 2018-07-15 MED ORDER — LORAZEPAM 1 MG PO TABS: 1.0000 mg | ORAL_TABLET | Freq: Two times a day (BID) | ORAL | Status: DC

## 2018-07-15 MED ORDER — TRAZODONE HCL 50 MG PO TABS
50.0000 mg | ORAL_TABLET | Freq: Every evening | ORAL | Status: DC | PRN
  Filled 2018-07-15 (×4): qty 1

## 2018-07-15 MED ORDER — THIAMINE HCL 100 MG/ML IJ SOLN: 100.0000 mg | Freq: Once | INTRAMUSCULAR | Status: DC

## 2018-07-15 MED ORDER — ADULT MULTIVITAMIN W/MINERALS CH
1.0000 | ORAL_TABLET | Freq: Every day | ORAL | Status: DC
  Administered 2018-07-15: 1 via ORAL
  Filled 2018-07-15 (×3): qty 1

## 2018-07-15 MED ORDER — LORAZEPAM 1 MG PO TABS: 1.0000 mg | ORAL_TABLET | Freq: Four times a day (QID) | ORAL | Status: DC | PRN

## 2018-07-15 MED ORDER — ALUM & MAG HYDROXIDE-SIMETH 200-200-20 MG/5ML PO SUSP: 30.0000 mL | ORAL | Status: DC | PRN

## 2018-07-15 MED ORDER — LORAZEPAM 1 MG PO TABS: 1.0000 mg | ORAL_TABLET | Freq: Every day | ORAL | Status: DC

## 2018-07-15 NOTE — ED Notes (Signed)
Tele psych machine at bedside 

## 2018-07-15 NOTE — Progress Notes (Signed)
Pt completed her daily assessment and on this she wrote she denied SI today and she rated her depression, hopelessness and anxiety " 8/7/8", respectively. Her d instrucitons were reviewed with her, she stated verbal understanding and willingness to comply and she is given cc of all ( SRA, AVS, SSP and transitin record), in addition to being given all belongings in her locker. She is escorted to bldg entrance and dc'd per MD order.

## 2018-07-15 NOTE — BHH Suicide Risk Assessment (Signed)
[place] INPATIENT:  Family/Significant Other Suicide Prevention Education  Suicide Prevention Education:  Education Completed; Pt's husband, Wendy Duffy [phone]),  has been identified by the patient as the family member/significant other with whom the patient will be residing, and identified as the person(s) who will aid the patient in the event of a mental health crisis (suicidal ideations/suicide attempt).  With written consent from the patient, the family member/significant other has been provided the following suicide prevention education, prior to the and/or following the discharge of the patient.  The suicide prevention education provided includes the following:  Suicide risk factors  Suicide prevention and interventions  National Suicide Hotline telephone number  [facility] assessment telephone number  [facility] Emergency Assistance [place] and/or Residential Mobile Crisis Unit telephone number  Request made of family/significant other to:  Remove weapons (e.g., guns, rifles, knives), all items previously/currently identified as safety concern.    Remove drugs/medications (over-the-counter, prescriptions, illicit drugs), all items previously/currently identified as a safety concern.  The family member/significant other verbalizes understanding of the suicide prevention education information provided.  The family member/significant other agrees to remove the items of safety concern listed above.  CSW contacted pt's husband, Wendy Duffy. Pt's husband asked if she was discharging today. CSW confirmed that pt was discharging. Pt's husband reported that he would be picking her up. CSW let pt's husband know of her follow up for [facility] outpatient on 07/21/2018. Pt's husband reported that he also will make sure that she follows up with her PCP.   Wendy Duffy 07/15/2018, 11:03 AM

## 2018-07-15 NOTE — ED Notes (Signed)
Pt ambulated to the restroom and back to her room without difficulty.

## 2018-07-15 NOTE — Progress Notes (Signed)
  [facility] Adult Case Management Discharge Plan :  Will you be returning to the same living situation after discharge:  Yes,  with husband At discharge, do you have transportation home?: Yes,  husband is picking her up Do you have the ability to pay for your medications: Yes,  private insurance  Release of information consent forms completed and in the chart;  Patient's signature needed at discharge.  Patient to Follow up at: Follow-up Information    [facility] Follow up on 07/21/2018.   Specialty:  Behavioral Health Why:  Medication management appointment with Dr. Micheline Chapman is Thursday, 5/7 at 10:15a.  The appointment will be over the phone and your provider will contact you.  Contact information: [place] [address] [place] [phone]          Next level of care provider has access to [facility] and Suicide Prevention discussed: Yes,  with husband  Have you used any form of tobacco in the last 30 days? (Cigarettes, Smokeless Tobacco, Cigars, and/or Pipes): No  Has patient been referred to the Quitline?: N/A patient is not a smoker  Patient has been referred for addiction treatment: [place], [place] 07/15/2018, 11:05 AM

## 2018-07-15 NOTE — BHH Suicide Risk Assessment (Signed)
[facility] Discharge Suicide Risk Assessment   Principal Problem: MDD Discharge Diagnoses: Active Problems:   MDD (major depressive disorder), recurrent episode ([place])   Total Time spent with patient: 18 m  Musculoskeletal: Strength & Muscle Tone: within normal limits Gait & Station: normal Patient leans: N/A  Psychiatric Specialty Exam: ROS  Blood pressure (!) 117/91, pulse 100, temperature 98.5 F (36.9 C), temperature source Oral, resp. rate 18, height 5\' 1"  (1.549 m), last menstrual period 09/20/2015.Body mass index is 39.68 kg/m.  General Appearance: Casual  Eye Contact::  Good  Speech:  Clear and Coherent409  Volume:  Normal  Mood:  Dysphoric  Affect:  Constricted  Thought Process:  Coherent and Goal Directed  Orientation:  Full (Time, Place, and Person)  Thought Content:  WDL, Logical and Rumination  Suicidal Thoughts:  No  Homicidal Thoughts:  No  Memory:  Immediate;   Good  Judgement:  Good  Insight:  Good  Psychomotor Activity:  Normal  Concentration:  Good  Recall:  Good  Fund of Knowledge:Good  Language: Good  Akathisia:  Negative  Handed:  Right  AIMS (if indicated):     Assets:  Communication Skills Desire for Improvement Financial Resources/Insurance Housing Intimacy Leisure Time Physical Health Resilience Social Support Talents/Skills  Sleep:     Cognition: WNL  ADL's:  Intact   Mental Status Per Nursing Assessment::   On Admission:  Suicide plan, Suicidal ideation indicated by patient  Demographic Factors:  NA  Loss Factors: NA  Historical Factors: NA  Risk Reduction Factors:   Employed  Continued Clinical Symptoms:  Alcohol/Substance Abuse/Dependencies  Cognitive Features That Contribute To Risk:  None    Suicide Risk:  Minimal: No identifiable suicidal ideation.  Patients presenting with no risk factors but with morbid ruminations; may be classified as minimal risk based on the severity of the depressive symptoms    Plan Of  Care/Follow-up recommendations:  Activity:  full  Demetri Goshert, MD 07/15/2018, 9:51 AM

## 2018-07-15 NOTE — Progress Notes (Signed)
TTS called ED, placed on hold for 8 minutes. Will try again.  Lind Covert, MSW, LCSW Therapeutic Triage Specialist  [phone]

## 2018-07-15 NOTE — Progress Notes (Signed)
Patriciaann Clan, PA recommends inpt treatment. TTS to seek placement. EDP McDonald, Mia A, PA-C and has been advised.   TTS attempting to call nurse to advise of disposition but no answer.   Lind Covert, MSW, LCSW Therapeutic Triage Specialist  [phone]

## 2018-07-15 NOTE — Progress Notes (Signed)
TTS calling pt's nurse to request telepsych cart placed in room but no answer.
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
## 2018-07-15 NOTE — Discharge Summary (Signed)
Physician Discharge Summary Note  Patient:  Wendy Duffy is an 43 y.o., female MRN:  [id number] DOB:  07/14/1975 Patient phone:  [phone] (home)  Patient address:   [place] [address],  Total Time spent with patient: 45 minutes  Date of Admission:  07/15/2018 Date of Discharge: 07/15/2018  Reason for Admission:   Elize is 5 she was admitted overnight for observation after she had passed out while intoxicated see the admission note  Principal Problem: Depression alcohol intoxication Discharge Diagnoses: Active Problems:   MDD (major depressive disorder), recurrent episode [facility])   Past Psychiatric History: MDD  Past Medical History:  Past Medical History:  Diagnosis Date  . Anemia   . Anxiety   . Depression   . Gastritis   . Hemorrhoids   . Hypertension   . Sleep apnea    questionable refered for sleep study will schedule appointment   . Status post laparoscopic hysterectomy 10/21/2015   TLH with BSO    Past Surgical History:  Procedure Laterality Date  . BILATERAL SALPINGECTOMY Bilateral 10/21/2015   Procedure: BILATERAL SALPINGECTOMY;  Surgeon: Sherlyn Hay, DO;  Location: [facility];  Service: Gynecology;  Laterality: Bilateral;  . [place], 2003  . LAPAROSCOPIC HYSTERECTOMY N/A 10/21/2015   Procedure: HYSTERECTOMY TOTAL LAPAROSCOPIC;  Surgeon: Sherlyn Hay, DO;  Location: [facility];  Service: Gynecology;  Laterality: N/A;  . WISDOM TOOTH EXTRACTION     Family History:  Family History  Problem Relation Age of Onset  . Alcohol abuse Mother   . Drug abuse Maternal Uncle   . Alcohol abuse Maternal Grandmother   . Drug abuse Maternal Grandmother    Family Psychiatric  History: neg Social History:  Social History   Substance and Sexual Activity  Alcohol Use Yes  . Alcohol/week: 3.0 standard drinks  . Types: 1 Glasses of wine, 2 Cans of beer per week   Comment: weekly     Social History   Substance and Sexual  Activity  Drug Use No  . Types: Marijuana   Comment: last used THC in November 2015. using a few times a year    Social History   Socioeconomic History  . Marital status: Married    Spouse name: Not on file  . Number of children: Not on file  . Years of education: Not on file  . Highest education level: Not on file  Occupational History  . Occupation: Print production planner  Social Needs  . Financial resource strain: Not on file  . Food insecurity:    Worry: Not on file    Inability: Not on file  . Transportation needs:    Medical: Not on file    Non-medical: Not on file  Tobacco Use  . Smoking status: Never Smoker  . Smokeless tobacco: Never Used  Substance and Sexual Activity  . Alcohol use: Yes    Alcohol/week: 3.0 standard drinks    Types: 1 Glasses of wine, 2 Cans of beer per week    Comment: weekly  . Drug use: No    Types: Marijuana    Comment: last used THC in November 2015. using a few times a year  . Sexual activity: Yes    Birth control/protection: None  Lifestyle  . Physical activity:    Days per week: Not on file    Minutes per session: Not on file  . Stress: Not on file  Relationships  . Social connections:    Talks  on phone: Not on file    Gets together: Not on file    Attends religious service: Not on file    Active member of club or organization: Not on file    Attends meetings of clubs or organizations: Not on file    Relationship status: Not on file  Other Topics Concern  . Not on file  Social History Narrative  . Not on file    Hospital Course:    Patient was admitted and monitored she displayed no danger behaviors she was put on a detox regimen to be on the safe side but by the morning of the first home she simply did not need or want a detox regimen she was missing work.  I found her to be alert oriented cooperative without thoughts of harming self or others, contracting fully, stable in mood and affect and requesting discharge home and  expressing her request for discharge.  Since she was not suicidal, basic got intoxicated and passed out in response to grieving over her causes death but she was not suicidal not psychotic contracting fully and stable for release  Physical Findings: AIMS: Facial and Oral Movements Muscles of Facial Expression: None, normal Lips and Perioral Area: None, normal Jaw: None, normal Tongue: None, normal,Extremity Movements Upper (arms, wrists, hands, fingers): None, normal Lower (legs, knees, ankles, toes): None, normal, Trunk Movements Neck, shoulders, hips: None, normal, Overall Severity Severity of abnormal movements (highest score from questions above): None, normal Incapacitation due to abnormal movements: None, normal Patient's awareness of abnormal movements (rate only patient's report): No Awareness, Dental Status Current problems with teeth and/or dentures?: No Does patient usually wear dentures?: No  CIWA:  CIWA-Ar Total: 6 COWS:     Musculoskeletal: Strength & Muscle Tone: within normal limits Gait & Station: normal Patient leans: N/A  Psychiatric Specialty Exam: ROS  Blood pressure (!) 117/91, pulse 100, temperature 98.5 F (36.9 C), temperature source Oral, resp. rate 18, height 5\' 1"  (1.549 m), last menstrual period 09/20/2015.Body mass index is 39.68 kg/m.  General Appearance: Casual  Eye Contact::  Good  Speech:  Clear and Coherent409  Volume:  Normal  Mood:  Dysphoric  Affect:  Constricted  Thought Process:  Coherent and Goal Directed  Orientation:  Full (Time, Place, and Person)  Thought Content:  WDL, Logical and Rumination  Suicidal Thoughts:  No  Homicidal Thoughts:  No  Memory:  Immediate;   Good  Judgement:  Good  Insight:  Good  Psychomotor Activity:  Normal  Concentration:  Good  Recall:  Good  Fund of Knowledge:Good  Language: Good  Akathisia:  Negative  Handed:  Right  AIMS (if indicated):     Assets:  Communication Skills Desire for  Improvement Financial Resources/Insurance Housing Intimacy Leisure Time [place] Talents/Skills  Sleep:     Cognition: WNL  ADL's:  Intact      Have you used any form of tobacco in the last 30 days? (Cigarettes, Smokeless Tobacco, Cigars, and/or Pipes): No  Has this patient used any form of tobacco in the last 30 days? (Cigarettes, Smokeless Tobacco, Cigars, and/or Pipes) Yes, No  Blood Alcohol level:  Lab Results  Component Value Date   ETH 156 (H) 07/15/2018   ETH <11 34/74/2595    Metabolic Disorder Labs:  Lab Results  Component Value Date   HGBA1C 4.6 (L) 09/30/2017   MPG 80 09/17/2016   MPG 108 05/23/2015   Lab Results  Component Value Date  PROLACTIN 6.6 09/30/2017   PROLACTIN 6.8 09/17/2016   Lab Results  Component Value Date   CHOL 171 09/30/2017   TRIG 148 09/30/2017   HDL 88 09/30/2017   CHOLHDL 1.7 09/17/2016   VLDL 24 09/17/2016   LDLCALC 53 09/30/2017   LDLCALC 38 09/17/2016    See Psychiatric Specialty Exam and Suicide Risk Assessment completed by Attending Physician prior to discharge.  Discharge destination:  Home  Is patient on multiple antipsychotic therapies at discharge:  No   Has Patient had three or more failed trials of antipsychotic monotherapy by history:  No  Recommended Plan for Multiple Antipsychotic Therapies: NA   Allergies as of 07/15/2018      Reactions   Zoloft [sertraline Hcl]    Made her feel very anxious       Medication List    STOP taking these medications   ibuprofen 800 MG tablet Commonly known as:  ADVIL   QUEtiapine 200 MG tablet Commonly known as:  SEROQUEL   QUEtiapine 25 MG tablet Commonly known as:  SEROQUEL     TAKE these medications     Indication  amLODipine 10 MG tablet Commonly known as:  NORVASC TAKE ONE TABLET BY MOUTH DAILY  Indication:  High Blood Pressure Disorder   buPROPion 300 MG 24 hr tablet Commonly known as:  Wellbutrin XL Take 1 tablet  (300 mg total) by mouth daily. What changed:    medication strength  how much to take  Indication:  Major Depressive Disorder   cetirizine 10 MG tablet Commonly known as:  ZYRTEC Take 1 tablet (10 mg total) by mouth daily as needed for allergies.  Indication:  Upper Respiratory Tract Allergy   fluticasone 50 MCG/ACT nasal spray Commonly known as:  FLONASE Place 2 sprays into both nostrils daily. What changed:    when to take this  reasons to take this  Indication:  Signs and Symptoms of Nose Diseases   hydrochlorothiazide 25 MG tablet Commonly known as:  HYDRODIURIL TAKE ONE TABLET BY MOUTH DAILY  Indication:  High Blood Pressure Disorder   losartan 100 MG tablet Commonly known as:  COZAAR TAKE ONE TABLET BY MOUTH DAILY  Indication:  High Blood Pressure Disorder   omeprazole 40 MG capsule Commonly known as:  PRILOSEC take 1 capsule by mouth once daily What changed:    how much to take  how to take this  when to take this  additional instructions  Indication:  Gastroesophageal Reflux Disease   traZODone 150 MG tablet Commonly known as:  DESYREL Take 0.5 tablets (75 mg total) by mouth at bedtime as needed for sleep.  Indication:  Anxiety Disorder   venlafaxine XR 75 MG 24 hr capsule Commonly known as:  EFFEXOR-XR Take 3 capsules (225 mg total) by mouth daily. What changed:  Another medication with the same name was removed. Continue taking this medication, and follow the directions you see here.  Indication:  Major Depressive Disorder        SignedJohnn Hai, MD 07/15/2018, 10:09 AM

## 2018-07-15 NOTE — ED Notes (Signed)
Toxicology labs not present repeat dark green top sent

## 2018-07-15 NOTE — Progress Notes (Signed)
Admission Note:   Wendy Duffy is an 43 y.o. female who presents VOL to [facility] for increase depression and SI with thoughts of "putting the gun in my mouth and pulling the trigger." Pt states her cousin passed away today. Pt reports she feels that she is a burden on her family and she wants to "end it all." Pt states she is having financial issues. Pt states she lives with her husband and her 77 y.o. son. Pt states "My husband was supportive but not anymore. Pt states "he keeps all the money". Pt denies verbal/sex/emotional abuse. Pt endorses increased alcohol use and reports she consumes up to 6 shots of liquor every other day. Pt also endorses THC-use several times a week. Pt's psychiatrist is Dr. Britt Bolognese was assessed and found to be clear of any abnormal marks. Pt searched and no contraband found, POC and unit policies explained and understanding verbalized. Consents obtained. Food and fluids offered, and fluids accepted. Pt had no additional questions or concerns. Pt is able to verbal contract for safety while on the unit. Belongings in locker # 8. Will continue with POC.

## 2018-07-15 NOTE — Progress Notes (Signed)
TTS called charge at [phone], no answer. Spoke with Jackelyn Poling who states she will attempt to locate the cart to place in the pt's room.   Lind Covert, MSW, LCSW Therapeutic Triage Specialist  [phone]

## 2018-07-15 NOTE — Progress Notes (Signed)
[place] to review for possible admission pending review of pt labs and UDS which are currently unavailable.  Lind Covert, MSW, LCSW Therapeutic Triage Specialist  [phone]

## 2018-07-15 NOTE — H&P (Signed)
Psychiatric Admission Assessment Adult  Patient Identification: Wendy Duffy MRN:  [id number] Date of Evaluation:  07/15/2018 Chief Complaint:  MDD, Recurrent-Severe Without Psychotic Features ETOH Use Disorder, Severe Cannabis Use Disorder Principal Diagnosis: Acute alcohol intoxication, depression recurrent severe without psychosis Diagnosis:  Active Problems:   MDD (major depressive disorder), recurrent episode ([place])  History of Present Illness:   Patient is a 42 she does suffer from chronic depression for which she is treated with quetiapine, Wellbutrin, and venlafaxine.  She recently was grieving the loss of her cousin with other family members she began doing shots and passed out.  She states she does not drink regularly.  She denies suicidal thoughts plans or intent.  She states she does not need to want detox measures.  She is requesting discharge.  I do not find her to be dangerous see the mental status exam and discharge summary below.  But we did make the following adjustments were going to escalate her Wellbutrin due to the depression that is exacerbated by the recent loss and were going to replace quetiapine with trazodone because of the weight gain and has caused  Associated Signs/Symptoms: Depression Symptoms:  insomnia, (Hypo) Manic Symptoms:  n/a Anxiety Symptoms:  Excessive Worry, Psychotic Symptoms:  n/a PTSD Symptoms: NA Total Time spent with patient: 45 minutes  Past Psychiatric History: MDD  Is the patient at risk to self? No.  Has the patient been a risk to self in the past 6 months? No.  Has the patient been a risk to self within the distant past? No.  Is the patient a risk to others? No.  Has the patient been a risk to others in the past 6 months? No.  Has the patient been a risk to others within the distant past? No.    Alcohol Screening: 1. How often do you have a drink containing alcohol?: 4 or more times a week 2. How many drinks containing alcohol do  you have on a typical day when you are drinking?: 5 or 6 3. How often do you have six or more drinks on one occasion?: Daily or almost daily AUDIT-C Score: 10 4. How often during the last year have you found that you were not able to stop drinking once you had started?: Less than monthly 5. How often during the last year have you failed to do what was normally expected from you becasue of drinking?: Less than monthly 6. How often during the last year have you needed a first drink in the morning to get yourself going after a heavy drinking session?: Never 7. How often during the last year have you had a feeling of guilt of remorse after drinking?: Daily or almost daily 8. How often during the last year have you been unable to remember what happened the night before because you had been drinking?: Never 9. Have you or someone else been injured as a result of your drinking?: No 10. Has a relative or friend or a doctor or another health worker been concerned about your drinking or suggested you cut down?: No Alcohol Use Disorder Identification Test Final Score (AUDIT): 16 Substance Abuse History in the last 12 months:  Yes.   Consequences of Substance Abuse: NA Previous Psychotropic Medications: Yes  Psychological Evaluations: No  Past Medical History:  Past Medical History:  Diagnosis Date  . Anemia   . Anxiety   . Depression   . Gastritis   . Hemorrhoids   . Hypertension   .  Sleep apnea    questionable refered for sleep study will schedule appointment   . Status post laparoscopic hysterectomy 10/21/2015   TLH with BSO    Past Surgical History:  Procedure Laterality Date  . BILATERAL SALPINGECTOMY Bilateral 10/21/2015   Procedure: BILATERAL SALPINGECTOMY;  Surgeon: Sherlyn Hay, DO;  Location: [facility];  Service: Gynecology;  Laterality: Bilateral;  . [place], 2003  . LAPAROSCOPIC HYSTERECTOMY N/A 10/21/2015   Procedure: HYSTERECTOMY TOTAL LAPAROSCOPIC;  Surgeon:  Sherlyn Hay, DO;  Location: [facility];  Service: Gynecology;  Laterality: N/A;  . WISDOM TOOTH EXTRACTION     Family History:  Family History  Problem Relation Age of Onset  . Alcohol abuse Mother   . Drug abuse Maternal Uncle   . Alcohol abuse Maternal Grandmother   . Drug abuse Maternal Grandmother    Family Psychiatric  History: neg Tobacco Screening: Have you used any form of tobacco in the last 30 days? (Cigarettes, Smokeless Tobacco, Cigars, and/or Pipes): No Social History:  Social History   Substance and Sexual Activity  Alcohol Use Yes  . Alcohol/week: 3.0 standard drinks  . Types: 1 Glasses of wine, 2 Cans of beer per week   Comment: weekly     Social History   Substance and Sexual Activity  Drug Use No  . Types: Marijuana   Comment: last used THC in November 2015. using a few times a year    Additional Social History:                           Allergies:   Allergies  Allergen Reactions  . Zoloft [Sertraline Hcl]     Made her feel very anxious    Lab Results:  Results for orders placed or performed during the hospital encounter of 07/14/18 (from the past 48 hour(s))  Comprehensive metabolic panel     Status: None   Collection Time: 07/15/18  1:37 AM  Result Value Ref Range   Sodium 141 135 - 145 mmol/L   Potassium 3.6 3.5 - 5.1 mmol/L   Chloride 106 98 - 111 mmol/L   CO2 23 22 - 32 mmol/L   Glucose, Bld 88 70 - 99 mg/dL   BUN 13 6 - 20 mg/dL   Creatinine, Ser 0.79 0.44 - 1.00 mg/dL   Calcium 9.2 8.9 - 10.3 mg/dL   Total Protein 7.8 6.5 - 8.1 g/dL   Albumin 4.3 3.5 - 5.0 g/dL   AST 24 15 - 41 U/L   ALT 28 0 - 44 U/L   Alkaline Phosphatase 63 38 - 126 U/L   Total Bilirubin 0.7 0.3 - 1.2 mg/dL   GFR calc non Af Amer >60 >60 mL/min   GFR calc Af Amer >60 >60 mL/min   Anion gap 12 5 - 15    Comment: Performed at [facility] [place] [address], [place], [place] [address]  CBC with Diff     Status: Abnormal    Collection Time: 07/15/18  1:37 AM  Result Value Ref Range   WBC 5.8 4.0 - 10.5 K/uL   RBC 5.06 3.87 - 5.11 MIL/uL   Hemoglobin 15.5 (H) 12.0 - 15.0 g/dL   HCT 48.8 (H) 36.0 - 46.0 %   MCV 96.4 80.0 - 100.0 fL   MCH 30.6 26.0 - 34.0 pg   MCHC 31.8 30.0 - 36.0 g/dL   RDW 13.0 11.5 - 15.5 %   Platelets  398 150 - 400 K/uL   nRBC 0.0 0.0 - 0.2 %   Neutrophils Relative % 55 %   Neutro Abs 3.2 1.7 - 7.7 K/uL   Lymphocytes Relative 35 %   Lymphs Abs 2.0 0.7 - 4.0 K/uL   Monocytes Relative 4 %   Monocytes Absolute 0.2 0.1 - 1.0 K/uL   Eosinophils Relative 5 %   Eosinophils Absolute 0.3 0.0 - 0.5 K/uL   Basophils Relative 1 %   Basophils Absolute 0.0 0.0 - 0.1 K/uL   Immature Granulocytes 0 %   Abs Immature Granulocytes 0.02 0.00 - 0.07 K/uL    Comment: Performed at [facility], [place] [address] [place], [place] [address]  Ethanol     Status: Abnormal   Collection Time: 07/15/18  1:56 AM  Result Value Ref Range   Alcohol, Ethyl (B) 156 (H) <10 mg/dL    Comment: (NOTE) Lowest detectable limit for serum alcohol is 10 mg/dL. For medical purposes only. Performed at [facility], [place] [address] [place], [place] [address]   Salicylate level     Status: None   Collection Time: 07/15/18  1:56 AM  Result Value Ref Range   Salicylate Lvl <0.2 2.8 - 30.0 mg/dL    Comment: Performed at [facility], [place] [address], [place], [place] [address]  Acetaminophen level     Status: Abnormal   Collection Time: 07/15/18  1:56 AM  Result Value Ref Range   Acetaminophen (Tylenol), Serum <10 (L) 10 - 30 ug/mL    Comment: (NOTE) Therapeutic concentrations vary significantly. A range of 10-30 ug/mL  may be an effective concentration for many patients. However, some  are best treated at concentrations outside of this range. Acetaminophen concentrations >150 ug/mL at 4 hours after ingestion  and >50 ug/mL at 12 hours after ingestion are often  associated with  toxic reactions. Performed at [facility], [place] [address], [place], [place] [address]     Blood Alcohol level:  Lab Results  Component Value Date   ETH 156 (H) 07/15/2018   ETH <11 28/31/5176    Metabolic Disorder Labs:  Lab Results  Component Value Date   HGBA1C 4.6 (L) 09/30/2017   MPG 80 09/17/2016   MPG 108 05/23/2015   Lab Results  Component Value Date   PROLACTIN 6.6 09/30/2017   PROLACTIN 6.8 09/17/2016   Lab Results  Component Value Date   CHOL 171 09/30/2017   TRIG 148 09/30/2017   HDL 88 09/30/2017   CHOLHDL 1.7 09/17/2016   VLDL 24 09/17/2016   LDLCALC 53 09/30/2017   LDLCALC 38 09/17/2016    Current Medications: Current Facility-Administered Medications  Medication Dose Route Frequency Provider Last Rate Last Dose  . acetaminophen (TYLENOL) tablet 650 mg  650 mg Oral Q6H PRN Laverle Hobby, PA-C   650 mg at 07/15/18 0549  . alum & mag hydroxide-simeth (MAALOX/MYLANTA) 200-200-20 MG/5ML suspension 30 mL  30 mL Oral Q4H PRN Patriciaann Clan E, PA-C      . hydrOXYzine (ATARAX/VISTARIL) tablet 25 mg  25 mg Oral Q6H PRN Patriciaann Clan E, PA-C      . loperamide (IMODIUM) capsule 2-4 mg  2-4 mg Oral PRN Laverle Hobby, PA-C      . LORazepam (ATIVAN) tablet 1 mg  1 mg Oral Q6H PRN Laverle Hobby, PA-C      . LORazepam (ATIVAN) tablet 1 mg  1 mg Oral QID Laverle Hobby, PA-C  1 mg at 07/15/18 0829   Followed by  . [START ON 07/16/2018] LORazepam (ATIVAN) tablet 1 mg  1 mg Oral TID Laverle Hobby, PA-C       Followed by  . [START ON 07/17/2018] LORazepam (ATIVAN) tablet 1 mg  1 mg Oral BID Patriciaann Clan E, PA-C       Followed by  . [START ON 07/18/2018] LORazepam (ATIVAN) tablet 1 mg  1 mg Oral Daily Simon, Spencer E, PA-C      . magnesium hydroxide (MILK OF MAGNESIA) suspension 30 mL  30 mL Oral Daily PRN Laverle Hobby, PA-C      . multivitamin with minerals tablet 1 tablet  1 tablet Oral Daily Laverle Hobby, PA-C    1 tablet at 07/15/18 0827  . ondansetron (ZOFRAN-ODT) disintegrating tablet 4 mg  4 mg Oral Q6H PRN Laverle Hobby, PA-C   4 mg at 07/15/18 0550  . thiamine (B-1) injection 100 mg  100 mg Intramuscular Once Laverle Hobby, PA-C      . [START ON 07/16/2018] thiamine (VITAMIN B-1) tablet 100 mg  100 mg Oral Daily Simon, Spencer E, PA-C      . traZODone (DESYREL) tablet 50 mg  50 mg Oral QHS,MR X 1 Simon, Spencer E, PA-C       PTA Medications: Medications Prior to Admission  Medication Sig Dispense Refill Last Dose  . amLODipine (NORVASC) 10 MG tablet TAKE ONE TABLET BY MOUTH DAILY (Patient taking differently: Take 10 mg by mouth daily. ) 30 tablet 3 Past Month  . cetirizine (ZYRTEC) 10 MG tablet Take 1 tablet (10 mg total) by mouth daily as needed for allergies. 30 tablet 2 2weeks  . fluticasone (FLONASE) 50 MCG/ACT nasal spray Place 2 sprays into both nostrils daily. (Patient taking differently: Place 2 sprays into both nostrils daily as needed for allergies. ) 16 g 6 6 months at Unknown time  . hydrochlorothiazide (HYDRODIURIL) 25 MG tablet TAKE ONE TABLET BY MOUTH DAILY (Patient taking differently: Take 25 mg by mouth daily. ) 90 tablet 3 07/14/2018 at 0800  . ibuprofen (ADVIL,MOTRIN) 800 MG tablet Take 1 tablet (800 mg total) by mouth every 6 (six) hours as needed for mild pain, moderate pain or cramping. 40 tablet 1 Past Week at Unknown time  . losartan (COZAAR) 100 MG tablet TAKE ONE TABLET BY MOUTH DAILY (Patient taking differently: Take 100 mg by mouth daily. ) 90 tablet 0 07/14/2018 at 0800  . omeprazole (PRILOSEC) 40 MG capsule take 1 capsule by mouth once daily (Patient taking differently: Take 40 mg by mouth daily. ) 90 capsule 6 Past Week  . QUEtiapine (SEROQUEL) 200 MG tablet Take 1 tablet (200 mg total) by mouth at bedtime. 90 tablet 0 07/13/2018 at Unknown time  . QUEtiapine (SEROQUEL) 25 MG tablet Take 1 tablet (25 mg total) by mouth 2 (two) times daily as needed (anxiety). (Patient  not taking: Reported on 07/15/2018) 60 tablet 2 Not Taking at Unknown time  . venlafaxine XR (EFFEXOR-XR) 150 MG 24 hr capsule Take 150 mg by mouth daily after breakfast.    07/14/2018 at 0800  . venlafaxine XR (EFFEXOR-XR) 75 MG 24 hr capsule Take 3 capsules (225 mg total) by mouth daily. (Patient not taking: Reported on 07/15/2018) 270 capsule 0 Not Taking  . [DISCONTINUED] buPROPion (WELLBUTRIN XL) 150 MG 24 hr tablet Take 1 tablet (150 mg total) by mouth daily. 90 tablet 0 07/14/2018 at 0800  Musculoskeletal: Strength & Muscle  Tone: within normal limits Gait & Station: normal Patient leans: N/A  Psychiatric Specialty Exam: ROS for cardiovascular review of systems is hypertensive for general review of systems has gained weight probably from medications, for chemical dependency review of systems states she does not drink regularly endocrine review of systems no thyroid disease  Blood pressure (!) 117/91, pulse 100, temperature 98.5 F (36.9 C), temperature source Oral, resp. rate 18, height 5\' 1"  (1.549 m), last menstrual period 09/20/2015.Body mass index is 39.68 kg/m.  General Appearance: Casual  Eye Contact::  Good  Speech:  Clear and Coherent409  Volume:  Normal  Mood:  Dysphoric  Affect:  Constricted  Thought Process:  Coherent and Goal Directed  Orientation:  Full (Time, Place, and Person)  Thought Content:  WDL, Logical and Rumination  Suicidal Thoughts:  No  Homicidal Thoughts:  No  Memory:  Immediate;   Good  Judgement:  Good  Insight:  Good  Psychomotor Activity:  Normal  Concentration:  Good  Recall:  Good  Fund of Knowledge:Good  Language: Good  Akathisia:  Negative  Handed:  Right  AIMS (if indicated):     Assets:  Communication Skills Desire for Improvement Financial Resources/Insurance Housing Intimacy Leisure Time Physical Health Resilience Social Support Talents/Skills  Sleep:     Cognition: WNL  ADL's:  Intact     Treatment Plan Summary: Daily  contact with patient to assess and evaluate symptoms and progress in treatment  Observation Level/Precautions:  15 minute checks  Laboratory:  UDS  Psychotherapy:    Medications:    Consultations:    Discharge Concerns:    Estimated LOS:  Other:     Physician Treatment Plan for Primary Diagnosis: <principal problem not specified> Long Term Goal(s): Improvement in symptoms so as ready for discharge  Short Term Goals: Ability to identify changes in lifestyle to reduce recurrence of condition will improve  Physician Treatment Plan for Secondary Diagnosis: Active Problems:   MDD (major depressive disorder), recurrent episode ([place])  Long Term Goal(s): Improvement in symptoms so as ready for discharge  Short Term Goals: Compliance with prescribed medications will improve  I certify that inpatient services furnished can reasonably be expected to improve the patient's condition.    Johnn Hai, MD 5/1/20209:56 AM

## 2018-07-15 NOTE — Progress Notes (Signed)
Recreation Therapy Notes  Date:  5.1.20 Time: 0930 Location: [address] Dayroom  Group Topic: Stress Management  Goal Area(s) Addresses:  Patient will identify positive stress management techniques. Patient will identify benefits of using stress management post d/c.  Intervention:  Stress Management  Activity :  Meditation.  LRT introduced the stress management technique of meditation.  Meditation focused on making the most of the and the possibilities that come with it.  Patients were to follow along as meditation played in order to engage in activity.  Education:  Stress Management, Discharge Planning.   Education Outcome: Acknowledges Education  Clinical Observations/Feedback:  Pt did not attend group.    Victorino Sparrow, LRT/CTRS         Victorino Sparrow A 07/15/2018 10:55 AM

## 2018-07-15 NOTE — Tx Team (Signed)
Interdisciplinary Treatment and Diagnostic Plan Update  07/15/2018 Time of Session: 9:00am Wendy Duffy MRN: [id number]  Principal Diagnosis: <principal problem not specified>  Secondary Diagnoses: Active Problems:   MDD (major depressive disorder), recurrent episode ([place])   Current Medications:  Current Facility-Administered Medications  Medication Dose Route Frequency Provider Last Rate Last Dose  . acetaminophen (TYLENOL) tablet 650 mg  650 mg Oral Q6H PRN Laverle Hobby, PA-C   650 mg at 07/15/18 0549  . alum & mag hydroxide-simeth (MAALOX/MYLANTA) 200-200-20 MG/5ML suspension 30 mL  30 mL Oral Q4H PRN Patriciaann Clan E, PA-C      . hydrOXYzine (ATARAX/VISTARIL) tablet 25 mg  25 mg Oral Q6H PRN Patriciaann Clan E, PA-C      . loperamide (IMODIUM) capsule 2-4 mg  2-4 mg Oral PRN Laverle Hobby, PA-C      . LORazepam (ATIVAN) tablet 1 mg  1 mg Oral Q6H PRN Laverle Hobby, PA-C      . LORazepam (ATIVAN) tablet 1 mg  1 mg Oral QID Patriciaann Clan E, PA-C   1 mg at 07/15/18 6314   Followed by  . [START ON 07/16/2018] LORazepam (ATIVAN) tablet 1 mg  1 mg Oral TID Laverle Hobby, PA-C       Followed by  . [START ON 07/17/2018] LORazepam (ATIVAN) tablet 1 mg  1 mg Oral BID Patriciaann Clan E, PA-C       Followed by  . [START ON 07/18/2018] LORazepam (ATIVAN) tablet 1 mg  1 mg Oral Daily Simon, Spencer E, PA-C      . magnesium hydroxide (MILK OF MAGNESIA) suspension 30 mL  30 mL Oral Daily PRN Laverle Hobby, PA-C      . multivitamin with minerals tablet 1 tablet  1 tablet Oral Daily Laverle Hobby, PA-C   1 tablet at 07/15/18 0827  . ondansetron (ZOFRAN-ODT) disintegrating tablet 4 mg  4 mg Oral Q6H PRN Laverle Hobby, PA-C   4 mg at 07/15/18 0550  . thiamine (B-1) injection 100 mg  100 mg Intramuscular Once Laverle Hobby, PA-C      . [START ON 07/16/2018] thiamine (VITAMIN B-1) tablet 100 mg  100 mg Oral Daily Simon, Spencer E, PA-C      . traZODone (DESYREL) tablet 50 mg  50 mg Oral  QHS,MR X 1 Simon, Spencer E, PA-C       PTA Medications: Medications Prior to Admission  Medication Sig Dispense Refill Last Dose  . amLODipine (NORVASC) 10 MG tablet TAKE ONE TABLET BY MOUTH DAILY (Patient taking differently: Take 10 mg by mouth daily. ) 30 tablet 3 Past Month  . cetirizine (ZYRTEC) 10 MG tablet Take 1 tablet (10 mg total) by mouth daily as needed for allergies. 30 tablet 2 2weeks  . fluticasone (FLONASE) 50 MCG/ACT nasal spray Place 2 sprays into both nostrils daily. (Patient taking differently: Place 2 sprays into both nostrils daily as needed for allergies. ) 16 g 6 6 months at Unknown time  . hydrochlorothiazide (HYDRODIURIL) 25 MG tablet TAKE ONE TABLET BY MOUTH DAILY (Patient taking differently: Take 25 mg by mouth daily. ) 90 tablet 3 07/14/2018 at 0800  . ibuprofen (ADVIL,MOTRIN) 800 MG tablet Take 1 tablet (800 mg total) by mouth every 6 (six) hours as needed for mild pain, moderate pain or cramping. 40 tablet 1 Past Week at Unknown time  . losartan (COZAAR) 100 MG tablet TAKE ONE TABLET BY MOUTH DAILY (Patient taking differently: Take 100  mg by mouth daily. ) 90 tablet 0 07/14/2018 at 0800  . omeprazole (PRILOSEC) 40 MG capsule take 1 capsule by mouth once daily (Patient taking differently: Take 40 mg by mouth daily. ) 90 capsule 6 Past Week  . QUEtiapine (SEROQUEL) 200 MG tablet Take 1 tablet (200 mg total) by mouth at bedtime. 90 tablet 0 07/13/2018 at Unknown time  . QUEtiapine (SEROQUEL) 25 MG tablet Take 1 tablet (25 mg total) by mouth 2 (two) times daily as needed (anxiety). (Patient not taking: Reported on 07/15/2018) 60 tablet 2 Not Taking at Unknown time  . venlafaxine XR (EFFEXOR-XR) 150 MG 24 hr capsule Take 150 mg by mouth daily after breakfast.    07/14/2018 at 0800  . venlafaxine XR (EFFEXOR-XR) 75 MG 24 hr capsule Take 3 capsules (225 mg total) by mouth daily. (Patient not taking: Reported on 07/15/2018) 270 capsule 0 Not Taking  . [DISCONTINUED] buPROPion  (WELLBUTRIN XL) 150 MG 24 hr tablet Take 1 tablet (150 mg total) by mouth daily. 90 tablet 0 07/14/2018 at 0800    Patient Stressors: Financial difficulties Marital or family conflict Substance abuse  Patient Strengths: Ability for insight Average or above average Architect for treatment/growth Special hobby/interest Supportive family/friends  Treatment Modalities: Medication Management, Group therapy, Case management,  1 to 1 session with clinician, Psychoeducation, Recreational therapy.   Physician Treatment Plan for Primary Diagnosis: <principal problem not specified> Long Term Goal(s): Improvement in symptoms so as ready for discharge Improvement in symptoms so as ready for discharge   Short Term Goals: Ability to identify changes in lifestyle to reduce recurrence of condition will improve Compliance with prescribed medications will improve  Medication Management: Evaluate patient's response, side effects, and tolerance of medication regimen.  Therapeutic Interventions: 1 to 1 sessions, Unit Group sessions and Medication administration.  Evaluation of Outcomes: Adequate for Discharge  Physician Treatment Plan for Secondary Diagnosis: Active Problems:   MDD (major depressive disorder), recurrent episode ([place])  Long Term Goal(s): Improvement in symptoms so as ready for discharge Improvement in symptoms so as ready for discharge   Short Term Goals: Ability to identify changes in lifestyle to reduce recurrence of condition will improve Compliance with prescribed medications will improve     Medication Management: Evaluate patient's response, side effects, and tolerance of medication regimen.  Therapeutic Interventions: 1 to 1 sessions, Unit Group sessions and Medication administration.  Evaluation of Outcomes: Adequate for Discharge   RN Treatment Plan for Primary Diagnosis: <principal problem not specified> Long Term Goal(s):  Knowledge of disease and therapeutic regimen to maintain health will improve  Short Term Goals: Ability to remain free from injury will improve, Ability to verbalize frustration and anger appropriately will improve, Ability to identify and develop effective coping behaviors will improve and Compliance with prescribed medications will improve  Medication Management: RN will administer medications as ordered by provider, will assess and evaluate patient's response and provide education to patient for prescribed medication. RN will report any adverse and/or side effects to prescribing provider.  Therapeutic Interventions: 1 on 1 counseling sessions, Psychoeducation, Medication administration, Evaluate responses to treatment, Monitor vital signs and CBGs as ordered, Perform/monitor CIWA, COWS, AIMS and Fall Risk screenings as ordered, Perform wound care treatments as ordered.  Evaluation of Outcomes: Adequate for Discharge   LCSW Treatment Plan for Primary Diagnosis: <principal problem not specified> Long Term Goal(s): Safe transition to appropriate next level of care at discharge, Engage patient in therapeutic group addressing interpersonal concerns.  Short  Term Goals: Engage patient in aftercare planning with referrals and resources, Increase social support, Identify triggers associated with mental health/substance abuse issues and Increase skills for wellness and recovery  Therapeutic Interventions: Assess for all discharge needs, 1 to 1 time with Social worker, Explore available resources and support systems, Assess for adequacy in community support network, Educate family and significant other(s) on suicide prevention, Complete Psychosocial Assessment, Interpersonal group therapy.  Evaluation of Outcomes: Adequate for Discharge   Progress in Treatment: Attending groups: No. New to unit. Participating in groups: No. Taking medication as prescribed: Yes. Toleration medication:  Yes. Family/Significant other contact made: No, will contact:  supports if consents are gratned Patient understands diagnosis: Yes. Discussing patient identified problems/goals with staff: Yes. Medical problems stabilized or resolved: Yes. Denies suicidal/homicidal ideation: Yes. Issues/concerns per patient self-inventory: No.  New problem(s) identified: No, Describe:  none  New Short Term/Long Term Goal(s): detox, medication management for mood stabilization; elimination of SI thoughts; development of comprehensive mental wellness/sobriety plan.   Patient Goals:  Discharge  Discharge Plan or Barriers: Discharging home today. CSW will follow up to offer outpatient resources.  Reason for Continuation of Hospitalization: Anxiety  Estimated Length of Stay: discharge today  Attendees: Patient: 07/15/2018 10:24 AM  Physician:  07/15/2018 10:24 AM  Nursing:  07/15/2018 10:24 AM  RN Care Manager: 07/15/2018 10:24 AM  Social Worker:  Stephanie Acre, [place] 07/15/2018 10:24 AM  Recreational Therapist:  07/15/2018 10:24 AM  Other:  07/15/2018 10:24 AM  Other:  07/15/2018 10:24 AM  Other: 07/15/2018 10:24 AM    Scribe for Treatment Team: Joellen Jersey, [place] 07/15/2018 10:24 AM

## 2018-07-15 NOTE — Progress Notes (Signed)
Report given to [facility], Therapist, sports. Pt oriented to unit rules and procedures. Pt verbalized understanding and is calm and cooperative.

## 2018-07-15 NOTE — Progress Notes (Signed)
[place] NOVEL CORONAVIRUS (COVID-19) DAILY CHECK-OFF SYMPTOMS - answer yes or no to each - every day NO YES  Have you had a fever in the past 24 hours?  . Fever (Temp > 37.80C / 100F) X   Have you had any of these symptoms in the past 24 hours? . New Cough .  Sore Throat  .  Shortness of Breath .  Difficulty Breathing .  Unexplained Body Aches   X   Have you had any one of these symptoms in the past 24 hours not related to allergies?   . Runny Nose .  Nasal Congestion .  Sneezing   X   If you have had runny nose, nasal congestion, sneezing in the past 24 hours, has it worsened?  X   EXPOSURES - check yes or no X   Have you traveled outside the state in the past 14 days?  X   Have you been in contact with someone with a confirmed diagnosis of COVID-19 or PUI in the past 14 days without wearing appropriate PPE?  X   Have you been living in the same home as a person with confirmed diagnosis of COVID-19 or a PUI (household contact)?    X   Have you been diagnosed with COVID-19?    X              What to do next: Answered NO to all: Answered YES to anything:   Proceed with unit schedule Follow the [facility] Inpatient Flowsheet.   

## 2018-07-15 NOTE — Tx Team (Signed)
Initial Treatment Plan 07/15/2018 5:45 AM Wendy Duffy QVL:[id number]    PATIENT STRESSORS: Financial difficulties Marital or family conflict Substance abuse   PATIENT STRENGTHS: Ability for insight Average or above average intelligence Communication skills Motivation for treatment/growth Special hobby/interest Supportive family/friends   PATIENT IDENTIFIED PROBLEMS: "At risk of suicide"   "Depression"   "Anxiety"   "substance abuse"   "finances"              DISCHARGE CRITERIA:  Ability to meet basic life and health needs Improved stabilization in mood, thinking, and/or behavior Withdrawal symptoms are absent or subacute and managed without 24-hour nursing intervention  PRELIMINARY DISCHARGE PLAN: Attend PHP/IOP Attend 12-step recovery group Outpatient therapy Participate in family therapy Return to previous living arrangement  PATIENT/FAMILY INVOLVEMENT: This treatment plan has been presented to and reviewed with the patient, Wendy Duffy.The patient have been given the opportunity to ask questions and make suggestions.  Lonia Skinner, RN 07/15/2018, 5:45 AM

## 2018-07-15 NOTE — Progress Notes (Signed)
Report received from admitting RN.  Met with pt 1:1.  Pt denies SI/HI, denies hallucinations, complains of pain from headache of 7/10.  PRN medication administered for pain and nausea.  She was provided with PO fluids and snacks.  Pt verbally contracts for safety.  She reports she will inform staff of needs and concerns.  Will continue to monitor and assess.

## 2018-07-21 ENCOUNTER — Other Ambulatory Visit: Payer: Self-pay

## 2018-07-21 ENCOUNTER — Ambulatory Visit (HOSPITAL_COMMUNITY): Payer: 59 | Admitting: Psychiatry

## 2018-07-21 ENCOUNTER — Encounter (HOSPITAL_COMMUNITY): Payer: Self-pay | Admitting: Psychiatry

## 2018-07-21 NOTE — Progress Notes (Incomplete)
Virtual Visit via Telephone Note  I connected with ARADHANA GIN on 07/21/18 at 10:15 AM EDT by telephone and verified that I am speaking with the correct person using two identifiers.  Location: Patient: car inparking lot of her work Provider: ***   I discussed the limitations, risks, security and privacy concerns of performing an evaluation and management service by telephone and the availability of in person appointments. I also discussed with the patient that there may be a patient responsible charge related to this service. The patient expressed understanding and agreed to proceed.  07/21/2018 10:27 AM ANNET MANUKYAN  MRN:  [id number]  Chief Complaint:   HPI: Last week her cousin died and pt went to her cousin's house and got drunk. She drank until she passed out. Someone called 911 and she was admitted to the inpt psych. That day she had a fight with her husband over finances, she was back at work and the news about the death of her cousin put her over the top. She felt the inpt hospitalization was not helpful and she did not like it. Today her depression is improved. She is not overly sad. Her cousin's funeral is tomorrow. Pt and her husband start MFT tomorrow as well. Her anxiety has decreased. Pt denies SI/HI. Work is going well but she is feeling overwhelmed by all the things going on in her life. Pt is taking Seroquel and not the other meds. Sleep is good. ***   No diagnosis found.    Past Psychiatric History:  Anxiety: Yes Bipolar Disorder: No Depression: Yes Mania: No Psychosis: No Schizophrenia: No Personality Disorder: No Hospitalization for psychiatric illness: No History of Electroconvulsive Shock Therapy: No Prior Suicide Attempts: Yes     Past Medical History:  Past Medical History:  Diagnosis Date  . Anemia   . Anxiety   . Depression   . Gastritis   . Hemorrhoids   . Hypertension   . Sleep apnea    questionable refered for sleep study will schedule  appointment   . Status post laparoscopic hysterectomy 10/21/2015   TL[facility] with BSO    Past Surgical History:  Procedure Laterality Date  . BILATERAL SALPINGECTOMY Bilateral 10/21/2015   Procedure: BILATERAL SALPINGECTOMY;  Surgeon: Sherlyn Hay, DO;  Location: [facility];  Service: Gynecology;  Laterality: Bilateral;  . [place], 2003  . LAPAROSCOPIC HYSTERECTOMY N/A 10/21/2015   Procedure: HYSTERECTOMY TOTAL LAPAROSCOPIC;  Surgeon: Sherlyn Hay, DO;  Location: [facility];  Service: Gynecology;  Laterality: N/A;  . WISDOM TOOTH EXTRACTION      Family Psychiatric History:  Family History  Problem Relation Age of Onset  . Alcohol abuse Mother   . Drug abuse Maternal Uncle   . Alcohol abuse Maternal Grandmother   . Drug abuse Maternal Grandmother     Social History:  Social History   Socioeconomic History  . Marital status: Married    Spouse name: Not on file  . Number of children: Not on file  . Years of education: Not on file  . Highest education level: Not on file  Occupational History  . Occupation: Print production planner  Social Needs  . Financial resource strain: Not on file  . Food insecurity:    Worry: Not on file    Inability: Not on file  . Transportation needs:    Medical: Not on file    Non-medical: Not on file  Tobacco Use  . Smoking status: Never Smoker  . Smokeless tobacco:  Never Used  Substance and Sexual Activity  . Alcohol use: Yes    Alcohol/week: 3.0 standard drinks    Types: 1 Glasses of wine, 2 Cans of beer per week    Comment: weekly  . Drug use: No    Types: Marijuana    Comment: last used THC in November 2015. using a few times a year  . Sexual activity: Yes    Birth control/protection: None  Lifestyle  . Physical activity:    Days per week: Not on file    Minutes per session: Not on file  . Stress: Not on file  Relationships  . Social connections:    Talks on phone: Not on file    Gets together: Not on file    Attends  religious service: Not on file    Active member of club or organization: Not on file    Attends meetings of clubs or organizations: Not on file    Relationship status: Not on file  Other Topics Concern  . Not on file  Social History Narrative  . Not on file    Allergies:  Allergies  Allergen Reactions  . Zoloft [Sertraline Hcl]     Made her feel very anxious     Metabolic Disorder Labs: Lab Results  Component Value Date   HGBA1C 4.6 (L) 09/30/2017   MPG 80 09/17/2016   MPG 108 05/23/2015   Lab Results  Component Value Date   PROLACTIN 6.6 09/30/2017   PROLACTIN 6.8 09/17/2016   Lab Results  Component Value Date   CHOL 171 09/30/2017   TRIG 148 09/30/2017   HDL 88 09/30/2017   CHOLHDL 1.7 09/17/2016   VLDL 24 09/17/2016   LDLCALC 53 09/30/2017   LDLCALC 38 09/17/2016   Lab Results  Component Value Date   TSH 0.435 (L) 09/30/2017   TSH 0.58 09/17/2016    Therapeutic Level Labs: No results found for: LITHIUM No results found for: VALPROATE No components found for:  CBMZ  Current Medications: Current Outpatient Medications  Medication Sig Dispense Refill  . amLODipine (NORVASC) 10 MG tablet TAKE ONE TABLET BY MOUTH DAILY (Patient taking differently: Take 10 mg by mouth daily. ) 30 tablet 3  . buPROPion (WELLBUTRIN XL) 300 MG 24 hr tablet Take 1 tablet (300 mg total) by mouth daily. 90 tablet 1  . cetirizine (ZYRTEC) 10 MG tablet Take 1 tablet (10 mg total) by mouth daily as needed for allergies. 30 tablet 2  . fluticasone (FLONASE) 50 MCG/ACT nasal spray Place 2 sprays into both nostrils daily. (Patient taking differently: Place 2 sprays into both nostrils daily as needed for allergies. ) 16 g 6  . hydrochlorothiazide (HYDRODIURIL) 25 MG tablet TAKE ONE TABLET BY MOUTH DAILY (Patient taking differently: Take 25 mg by mouth daily. ) 90 tablet 3  . losartan (COZAAR) 100 MG tablet TAKE ONE TABLET BY MOUTH DAILY (Patient taking differently: Take 100 mg by mouth  daily. ) 90 tablet 0  . omeprazole (PRILOSEC) 40 MG capsule take 1 capsule by mouth once daily (Patient taking differently: Take 40 mg by mouth daily. ) 90 capsule 6  . traZODone (DESYREL) 150 MG tablet Take 0.5 tablets (75 mg total) by mouth at bedtime as needed for sleep. 30 tablet 2  . venlafaxine XR (EFFEXOR-XR) 75 MG 24 hr capsule Take 3 capsules (225 mg total) by mouth daily. (Patient not taking: Reported on 07/15/2018) 270 capsule 0   No current facility-administered medications for this visit.  Psychiatric Specialty Exam: ROS  Last menstrual period 09/20/2015.There is no height or weight on file to calculate BMI.  General Appearance: {Appearance:22683}  Eye Contact:  {[facility] EYE CONTACT:22684}  Speech:  {Speech:22685}  Volume:  {Volume (PAA):22686}  Mood:  {BHH MOOD:22306}  Affect:  {Affect (PAA):22687}  Thought Process:  {Thought Process (PAA):22688}  Orientation:  {B[facility] ORIENTATION (PAA):22689}  Thought Content:  {Thought Content:22690}  Suicidal Thoughts:  {ST/HT (PAA):22692}  Homicidal Thoughts:  {ST/HT (PAA):22692}  Memory:  {B[facility] MEMORY:22881}  Judgement:  {Judgement (PAA):22694}  Insight:  {Insight (PAA):22695}  Psychomotor Activity:  {Psychomotor (PAA):22696}  Concentration:  {Concentration:21399}  Recall:  {B[facility] GOOD/FAIR/POOR:22877}  Fund of Knowledge:  {BHH GOOD/FAIR/POOR:22877}  Language:  {B[facility] GOOD/FAIR/POOR:22877}  Akathisia:  {B[facility] YES OR NO:22294}  Handed:  {Handed:22697}  AIMS (if indicated):     Assets:  {Assets (PAA):22698}  ADL's:  {B[facility] OYD'X:41287}  Cognition:  {chl bhh cognition:304700322}  Sleep:            Screenings: AIMS     Admission (Discharged) from 07/15/2018 in [facility]  AIMS Total Score  0    AUDIT     Admission (Discharged) from 07/15/2018 in [facility]  Alcohol Use Disorder Identification Test Final Score (AUDIT)  16    PHQ2-9     Office Visit from 07/09/2017  in [facility] Office Visit from 03/24/2017 in [facility] Office Visit from 11/12/2016 in [facility] Office Visit from 08/20/2016 in [facility] Office Visit from 07/20/2016 in [facility]  PHQ-2 Total Score  0  0  0  0  0      I reviewed the information below on 04/28/2018 and have updated it*** Assessment and Plan:  MDD-recurrent, moderate; GAD; Insomnia; OCD   Status of current mood problems: ***Ongoing anxiety, depression is stable    Medication management with supportive therapy. Risks/benefits and SE of the medication discussed. Pt verbalized understanding and verbal consent obtained for treatment.  Affirm with the patient that the medications are taken as ordered. Patient expressed understanding of how their medications were to be used.    Meds: ***increase Effexor XR 225 mg p.o. daily for MDD and GAD Restart Seroquel 150 mg p.o. nightly for MDD, GAD and insomnia ***Wellbutrin XL 150 mg p.o. daily for MDD and sexual side effects related to SNRI use  d/c Trazodone   Labs:  None ordered today   Therapy: brief supportive therapy provided. Discussed psychosocial stressors in detail.       Consultations: pt no longer in therapy Encouraged to follow up with PCP as needed   Pt denies SI and is at an acute low risk for suicide. Patient told to call clinic if any problems occur. Patient advised to go to ER if they should develop SI/HI, side effects, or if symptoms worsen. Has crisis numbers to call if needed. Pt verbalized understanding.   F/up in 1-2 months or sooner if needed  I provided 20 minutes of non face to face time during this encounter.   [name], MD 07/21/2018, 10:27 AM

## 2018-07-25 ENCOUNTER — Encounter: Payer: Self-pay | Admitting: Family Medicine

## 2018-07-28 ENCOUNTER — Ambulatory Visit (HOSPITAL_COMMUNITY): Payer: 59 | Admitting: Psychiatry

## 2018-08-21 ENCOUNTER — Other Ambulatory Visit (HOSPITAL_COMMUNITY): Payer: Self-pay | Admitting: Psychiatry

## 2018-08-21 DIAGNOSIS — F411 Generalized anxiety disorder: Secondary | ICD-10-CM

## 2018-08-21 DIAGNOSIS — F422 Mixed obsessional thoughts and acts: Secondary | ICD-10-CM

## 2018-08-21 DIAGNOSIS — F331 Major depressive disorder, recurrent, moderate: Secondary | ICD-10-CM

## 2018-08-26 ENCOUNTER — Telehealth (INDEPENDENT_AMBULATORY_CARE_PROVIDER_SITE_OTHER): Payer: Self-pay | Admitting: Family Medicine

## 2018-08-26 ENCOUNTER — Encounter: Payer: Self-pay | Admitting: *Deleted

## 2018-08-26 ENCOUNTER — Telehealth: Payer: Self-pay | Admitting: *Deleted

## 2018-08-26 ENCOUNTER — Telehealth: Payer: Self-pay | Admitting: General Practice

## 2018-08-26 ENCOUNTER — Ambulatory Visit: Payer: Self-pay | Admitting: Family Medicine

## 2018-08-26 ENCOUNTER — Other Ambulatory Visit: Payer: Self-pay

## 2018-08-26 DIAGNOSIS — Z20828 Contact with and (suspected) exposure to other viral communicable diseases: Secondary | ICD-10-CM

## 2018-08-26 DIAGNOSIS — Z20822 Contact with and (suspected) exposure to covid-19: Secondary | ICD-10-CM

## 2018-08-26 NOTE — Progress Notes (Signed)
[facility] Telemedicine Visit  Patient consented to have virtual visit. 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 She did stay home today for worry. She considers herself high risk 2/2 HTN. The sister center is not on the same campus as her center. 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 She has daily with contact w the administrators of concern via sharing a breakroom, electronics. She does have meetings with those adminstators/contacts of the original COVID case in their offices. She is not sure about testing of those people who had contact. The administrators are wearing masks for pickup but no masks inside the center. She has not had any sxs. No fever. This teacher was diagnosed with COVID yesterday. The administrators have not limited contact from the other center.   ROS: per HPI  Pertinent PMHx: htn   Exam:  Respiratory: speaks in long sentences easily. No audible increased WOB.   Assessment/Plan:  COVID exposure: my suspicion for acute infection is quite low. Explained that she should continue to practice good hand hygiene and I recommended that she speak to her work about everyone wearing masks while inside.  I will send for COVID testing given her concern and possible exposure.  Explained that the test is not perfect and she could still contract COVID in the future.  She should call if she has any new symptoms.  Time spent during visit with patient: 8 minutes  Ralene Ok, MD

## 2018-08-26 NOTE — Telephone Encounter (Signed)
lvm for pt to return call to schedule Covid-19 testing.

## 2018-08-26 NOTE — Telephone Encounter (Signed)
Pt returned call to be scheduled for COVID-19 testing. I scheduled her for Monday 08/29/2018 at 11:00 at the [facility] location in [place]. I let her know to stay in the car and wear a mask.  It's a drive thru testing site.  Order entered  Insur:  Airline pilot

## 2018-08-26 NOTE — Telephone Encounter (Signed)
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 Please test. She would prefer communication via mychart if possible. Thanks! Ralene Ok

## 2018-08-26 NOTE — Telephone Encounter (Signed)
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 She would prefer communication via mychart if possible. Thanks!  Wendy Duffy    Attempted to call patient to schedule appointment for testing- left message to call back for appointment. Order for testing has been placed. Message also sent to patient on MyChart

## 2018-08-29 ENCOUNTER — Other Ambulatory Visit: Payer: Self-pay

## 2018-08-29 DIAGNOSIS — Z20822 Contact with and (suspected) exposure to covid-19: Secondary | ICD-10-CM

## 2018-08-31 LAB — NOVEL CORONAVIRUS, NAA: SARS-CoV-2, NAA: NOT DETECTED

## 2018-09-01 ENCOUNTER — Telehealth (HOSPITAL_COMMUNITY): Payer: Self-pay | Admitting: Psychiatry

## 2018-09-01 ENCOUNTER — Ambulatory Visit (HOSPITAL_COMMUNITY): Payer: 59 | Admitting: Psychiatry

## 2018-09-01 ENCOUNTER — Other Ambulatory Visit: Payer: Self-pay

## 2018-09-01 NOTE — Telephone Encounter (Signed)
I called the patient twice during our scheduled appointment time. There was no answer. I was unable to speak with the patient.

## 2018-09-19 ENCOUNTER — Other Ambulatory Visit (HOSPITAL_COMMUNITY): Payer: Self-pay | Admitting: Psychiatry

## 2018-09-19 DIAGNOSIS — F422 Mixed obsessional thoughts and acts: Secondary | ICD-10-CM

## 2018-09-19 DIAGNOSIS — F331 Major depressive disorder, recurrent, moderate: Secondary | ICD-10-CM

## 2018-09-19 DIAGNOSIS — F411 Generalized anxiety disorder: Secondary | ICD-10-CM

## 2018-09-20 ENCOUNTER — Other Ambulatory Visit (HOSPITAL_COMMUNITY): Payer: Self-pay | Admitting: Psychiatry

## 2018-09-20 DIAGNOSIS — F331 Major depressive disorder, recurrent, moderate: Secondary | ICD-10-CM

## 2018-09-20 DIAGNOSIS — F411 Generalized anxiety disorder: Secondary | ICD-10-CM

## 2018-09-20 DIAGNOSIS — F5105 Insomnia due to other mental disorder: Secondary | ICD-10-CM

## 2018-09-20 DIAGNOSIS — F99 Mental disorder, not otherwise specified: Secondary | ICD-10-CM

## 2018-09-22 ENCOUNTER — Other Ambulatory Visit (HOSPITAL_COMMUNITY): Payer: Self-pay

## 2018-09-22 DIAGNOSIS — F411 Generalized anxiety disorder: Secondary | ICD-10-CM

## 2018-09-22 DIAGNOSIS — F331 Major depressive disorder, recurrent, moderate: Secondary | ICD-10-CM

## 2018-09-22 DIAGNOSIS — F422 Mixed obsessional thoughts and acts: Secondary | ICD-10-CM

## 2018-09-22 MED ORDER — VENLAFAXINE HCL ER 75 MG PO CP24: 225.0000 mg | ORAL_CAPSULE | Freq: Every day | ORAL | 0 refills | Status: DC

## 2018-09-27 ENCOUNTER — Other Ambulatory Visit (HOSPITAL_COMMUNITY): Payer: Self-pay

## 2018-09-27 DIAGNOSIS — F331 Major depressive disorder, recurrent, moderate: Secondary | ICD-10-CM

## 2018-09-27 DIAGNOSIS — F411 Generalized anxiety disorder: Secondary | ICD-10-CM

## 2018-09-27 DIAGNOSIS — F422 Mixed obsessional thoughts and acts: Secondary | ICD-10-CM

## 2018-09-28 ENCOUNTER — Telehealth (HOSPITAL_COMMUNITY): Payer: Self-pay

## 2018-09-28 DIAGNOSIS — F331 Major depressive disorder, recurrent, moderate: Secondary | ICD-10-CM

## 2018-09-28 DIAGNOSIS — F422 Mixed obsessional thoughts and acts: Secondary | ICD-10-CM

## 2018-09-28 DIAGNOSIS — F411 Generalized anxiety disorder: Secondary | ICD-10-CM

## 2018-09-28 MED ORDER — VENLAFAXINE HCL ER 75 MG PO CP24: 225.0000 mg | ORAL_CAPSULE | Freq: Every day | ORAL | 1 refills | Status: DC

## 2018-09-28 MED ORDER — BUPROPION HCL ER (XL) 300 MG PO TB24: 300.0000 mg | ORAL_TABLET | Freq: Every day | ORAL | 1 refills | Status: DC

## 2018-09-28 MED ORDER — QUETIAPINE FUMARATE 300 MG PO TABS: 150.0000 mg | ORAL_TABLET | Freq: Every day | ORAL | 1 refills | Status: DC

## 2018-09-28 NOTE — Telephone Encounter (Signed)
Patient was switched to Trazodone from Seroquel in the hospital, patient is not sleeping and would like to go back to the Seroquel. Please review and advise, thank you

## 2018-09-28 NOTE — Telephone Encounter (Signed)
I spoke with Wendy Duffy on the phone.  She tells me that she ran out of her Effexor and Seroquel at least a week ago.  She tells me that she has been feeling sad and emotional because her aunt is very sick.  She missed our last scheduled visit due to her other aunt being in hospice.  Patient states that she is not sleeping and really needs to restart both the medications.  She is denying hopelessness.  She denies SI/HI.  She states that she does not feel she needs to be hospitalized on psychiatric unit at this time.  I have sent in refills for Wellbutrin 300 mg p.o. daily Seroquel 150 mg p.o. nightly Effexor 225 mg p.o. daily- I instructed patient on how to titrate this medication to minimize side effects  Patient has an appointment scheduled with me on August 20.  She will call if there are any significant changes and she is aware of the crisis plan.

## 2018-11-03 ENCOUNTER — Telehealth (HOSPITAL_COMMUNITY): Payer: Self-pay | Admitting: Psychiatry

## 2018-11-03 ENCOUNTER — Ambulatory Visit (HOSPITAL_COMMUNITY): Payer: Self-pay | Admitting: Psychiatry

## 2018-11-03 ENCOUNTER — Other Ambulatory Visit: Payer: Self-pay

## 2018-11-03 NOTE — Telephone Encounter (Signed)
I called Wendy Duffy on her mobile number. There was a message saying that the number was disconnect or no longer in service. I called her work number and was told she is no longer employed there. I do not have any other number to contact her.

## 2018-11-22 ENCOUNTER — Other Ambulatory Visit: Payer: Self-pay

## 2018-11-22 MED ORDER — LOSARTAN POTASSIUM 100 MG PO TABS: 100.0000 mg | ORAL_TABLET | Freq: Every day | ORAL | 0 refills | Status: DC

## 2018-11-22 NOTE — Telephone Encounter (Signed)
Patient has not been seen in the office for at least a year.  Please schedule an office visit, virtual if patient has access to a blood pressure cuff.

## 2018-11-23 ENCOUNTER — Other Ambulatory Visit (HOSPITAL_COMMUNITY): Payer: Self-pay | Admitting: Psychiatry

## 2018-11-23 DIAGNOSIS — F411 Generalized anxiety disorder: Secondary | ICD-10-CM

## 2018-11-23 DIAGNOSIS — F331 Major depressive disorder, recurrent, moderate: Secondary | ICD-10-CM

## 2018-11-23 NOTE — Telephone Encounter (Signed)
Attempted to call patient but there was no answer or voicemail available.  Will send a mychart message to patient also asking her to make an appointment to follow up on her blood pressure.  TRUE Shackleford,CMA

## 2018-12-08 ENCOUNTER — Encounter (HOSPITAL_COMMUNITY): Payer: Self-pay | Admitting: Psychiatry

## 2018-12-08 ENCOUNTER — Ambulatory Visit (INDEPENDENT_AMBULATORY_CARE_PROVIDER_SITE_OTHER): Payer: Self-pay | Admitting: Psychiatry

## 2018-12-08 ENCOUNTER — Other Ambulatory Visit: Payer: Self-pay

## 2018-12-08 DIAGNOSIS — F422 Mixed obsessional thoughts and acts: Secondary | ICD-10-CM

## 2018-12-08 DIAGNOSIS — F411 Generalized anxiety disorder: Secondary | ICD-10-CM

## 2018-12-08 DIAGNOSIS — F331 Major depressive disorder, recurrent, moderate: Secondary | ICD-10-CM

## 2018-12-08 MED ORDER — VENLAFAXINE HCL ER 75 MG PO CP24: 225.0000 mg | ORAL_CAPSULE | Freq: Every day | ORAL | 3 refills | Status: DC

## 2018-12-08 MED ORDER — BUPROPION HCL ER (XL) 300 MG PO TB24: 300.0000 mg | ORAL_TABLET | Freq: Every day | ORAL | 3 refills | Status: DC

## 2018-12-08 MED ORDER — QUETIAPINE FUMARATE 300 MG PO TABS: ORAL_TABLET | ORAL | 3 refills | Status: DC

## 2018-12-08 NOTE — Progress Notes (Signed)
  Virtual Visit via Telephone Note  I connected with Wendy Duffy  on 12/08/18 at 10:15 AM EDT by telephone and verified that I am speaking with the correct person using two identifiers.  Location: Patient: name Provider: office   I discussed the limitations, risks, security and privacy concerns of performing an evaluation and management service by telephone and the availability of in person appointments. I also discussed with the patient that there may be a patient responsible charge related to this service. The patient expressed understanding and agreed to proceed.   History of Present Illness: Pt states she is doing much better now that she is taking her meds consistently. She denies depression. Her sleep is good. She denies SI/HI. She doesn't go out much due to [place] and finances. She and her husband are not working and so she is very stressed about their finances. She is continuously searching for jobs and is hopeful to find something soon. She is focused.     Observations/Objective: I spoke with Wendy Duffy on the phone.  Pt was calm, pleasant and cooperative.  Pt was engaged in the conversation and answered questions appropriately.  Speech was clear and coherent with normal rate, tone and volume.  Mood is  anxious, affect is full. Thought processes are coherent, goal oriented and intact.  Thought content is logical.  Pt denies SI/HI.   Pt denies auditory and visual hallucinations and did not appear to be responding to internal stimuli.  Memory and concentration are good.  Fund of knowledge and use of language are average.  Insight and judgment are fair.  I am unable to comment on psychomotor activity, general appearance, hygiene, or eye contact as I was unable to physically see the patient on the phone.  Vital signs not available since interview conducted virtually.     Assessment and Plan: MDD- recurrent, mild; GAD; Insomnia; OCD  Effexor XR 225mg  po qD Seroquel 150mg  po  qHS Wellbutrin XL 150mg  po qD for MDD and sexual SE of SNRI use  Status of current symptoms: significant improvment    Follow Up Instructions: In 10 weeks or sooner if needed   I discussed the assessment and treatment plan with the patient. The patient was provided an opportunity to ask questions and all were answered. The patient agreed with the plan and demonstrated an understanding of the instructions.   The patient was advised to call back or seek an in-person evaluation if the symptoms worsen or if the condition fails to improve as anticipated.  I provided 20 minutes of non-face-to-face time during this encounter.   [name], MD

## 2018-12-09 ENCOUNTER — Encounter: Payer: Self-pay | Admitting: Family Medicine

## 2018-12-19 ENCOUNTER — Other Ambulatory Visit: Payer: Self-pay

## 2018-12-20 MED ORDER — AMLODIPINE BESYLATE 10 MG PO TABS: 10.0000 mg | ORAL_TABLET | Freq: Every day | ORAL | 3 refills | Status: DC

## 2018-12-21 ENCOUNTER — Encounter: Payer: Self-pay | Admitting: Family Medicine

## 2018-12-25 ENCOUNTER — Other Ambulatory Visit (HOSPITAL_COMMUNITY): Payer: Self-pay | Admitting: Psychiatry

## 2018-12-25 DIAGNOSIS — F411 Generalized anxiety disorder: Secondary | ICD-10-CM

## 2018-12-25 DIAGNOSIS — F331 Major depressive disorder, recurrent, moderate: Secondary | ICD-10-CM

## 2018-12-28 ENCOUNTER — Encounter: Payer: Self-pay | Admitting: Family Medicine

## 2019-01-13 ENCOUNTER — Telehealth: Payer: Self-pay

## 2019-01-13 ENCOUNTER — Ambulatory Visit (INDEPENDENT_AMBULATORY_CARE_PROVIDER_SITE_OTHER): Payer: Self-pay

## 2019-01-13 ENCOUNTER — Other Ambulatory Visit: Payer: Self-pay

## 2019-01-13 DIAGNOSIS — Z111 Encounter for screening for respiratory tuberculosis: Secondary | ICD-10-CM

## 2019-01-13 NOTE — Telephone Encounter (Signed)
Patient in for a TB test for her new job. Patient needs her health assessment form filled out for employer. I reminded her she needs to make an apt on her way out to meet new PCP. I placed form in your box, return to RN clinic once completed.

## 2019-01-13 NOTE — Progress Notes (Signed)
Patient presents in nurse clinic for PPD test. PPD placed in left ventral forearm. Patient to return at 10am on November 2nd. Reminder card given.

## 2019-01-16 ENCOUNTER — Other Ambulatory Visit: Payer: Self-pay

## 2019-01-16 ENCOUNTER — Ambulatory Visit: Payer: Self-pay

## 2019-01-16 DIAGNOSIS — Z111 Encounter for screening for respiratory tuberculosis: Secondary | ICD-10-CM

## 2019-01-16 LAB — TB SKIN TEST
Induration: 0 mm
TB Skin Test: NEGATIVE

## 2019-01-16 NOTE — Progress Notes (Signed)
PPD Reading Note PPD read and results entered in [facility]. Result: 0 mm induration. Interpretation: Negative  Allergic reaction: no Letter printed for her employer.

## 2019-01-25 ENCOUNTER — Other Ambulatory Visit: Payer: Self-pay | Admitting: *Deleted

## 2019-01-25 MED ORDER — LOSARTAN POTASSIUM 100 MG PO TABS: 100.0000 mg | ORAL_TABLET | Freq: Every day | ORAL | 0 refills | Status: DC

## 2019-02-16 ENCOUNTER — Encounter (HOSPITAL_COMMUNITY): Payer: Self-pay | Admitting: Psychiatry

## 2019-02-16 ENCOUNTER — Ambulatory Visit (INDEPENDENT_AMBULATORY_CARE_PROVIDER_SITE_OTHER): Payer: Self-pay | Admitting: Psychiatry

## 2019-02-16 ENCOUNTER — Other Ambulatory Visit: Payer: Self-pay

## 2019-02-16 DIAGNOSIS — F411 Generalized anxiety disorder: Secondary | ICD-10-CM

## 2019-02-16 DIAGNOSIS — F422 Mixed obsessional thoughts and acts: Secondary | ICD-10-CM

## 2019-02-16 DIAGNOSIS — F331 Major depressive disorder, recurrent, moderate: Secondary | ICD-10-CM

## 2019-02-16 MED ORDER — VENLAFAXINE HCL ER 75 MG PO CP24: 225.0000 mg | ORAL_CAPSULE | Freq: Every day | ORAL | 3 refills | Status: DC

## 2019-02-16 MED ORDER — QUETIAPINE FUMARATE 300 MG PO TABS: ORAL_TABLET | ORAL | 3 refills | Status: DC

## 2019-02-16 MED ORDER — BUPROPION HCL ER (XL) 300 MG PO TB24: 300.0000 mg | ORAL_TABLET | Freq: Every day | ORAL | 3 refills | Status: DC

## 2019-02-16 NOTE — Progress Notes (Signed)
  Virtual Visit via Telephone Note  I connected with Wendy Duffy  on 02/16/19 at  2:30 PM EST by telephone and verified that I am speaking with the correct person using two identifiers.  Location: Patient: home Provider: office   I discussed the limitations, risks, security and privacy concerns of performing an evaluation and management service by telephone and the availability of in person appointments. I also discussed with the patient that there may be a patient responsible charge related to this service. The patient expressed understanding and agreed to proceed.   History of Present Illness: "I' better. I have been taking my meds on the regular". I have calmed down".  Overall she does feel she is headed in the right direction where she was about 1 yr ago. At random times she gets very anxious. During this time she gets racing thoughts and feels like she can't sit still. It lasts for about on hour. This happens about twice/month. Her sleep is good and she is getting about 8-10 hrs/night. Depression has improved. She is no longer having random crying spells. Wendy Duffy doesn't feel sad. She feels more like herself. She denies hopelessness. She denies SI/HI. Wendy Duffy is back to work but is on quarantine due to being in contact with a positive person.    Observations/Objective: I spoke with Wendy Duffy on the phone.  Pt was calm, pleasant and cooperative.  Pt was engaged in the conversation and answered questions appropriately.  Speech was clear and coherent with normal rate, tone and volume.  Mood is euthymic and affect is full. Thought processes are coherent, goal oriented and intact.  Thought content is logical  Pt denies SI/HI.   Pt denies auditory and visual hallucinations and did not appear to be responding to internal stimuli.  Memory and concentration are good.  Fund of knowledge and use of language are average.  Insight and judgment are fair.  I am unable to comment on psychomotor  activity, general appearance, hygiene, or eye contact as I was unable to physically see the patient on the phone.  Vital signs not available since interview conducted virtually.     Assessment and Plan: GAD; MDD-recurrent, mild; Insomnia; OCD  Status of current symptoms: improved mood and anxiety  Effexor XR 225mg  po qD Seroquel 150mg  po qHS Wellbutrin XL 150mg  po qD for MDD and sexual SE from SNRI    Follow Up Instructions: In 12 weeks or sooner if needed   I discussed the assessment and treatment plan with the patient. The patient was provided an opportunity to ask questions and all were answered. The patient agreed with the plan and demonstrated an understanding of the instructions.   The patient was advised to call back or seek an in-person evaluation if the symptoms worsen or if the condition fails to improve as anticipated.  I provided 20 minutes of non-face-to-face time during this encounter.   Wendy Cradle, MD

## 2019-02-23 ENCOUNTER — Other Ambulatory Visit: Payer: Self-pay

## 2019-02-23 ENCOUNTER — Ambulatory Visit
Admission: RE | Admit: 2019-02-23 | Discharge: 2019-02-23 | Disposition: A | Discharge status: Home / Self Care | Payer: Self-pay | Source: Ambulatory Visit | Attending: Family Medicine | Admitting: Family Medicine

## 2019-02-23 ENCOUNTER — Ambulatory Visit (INDEPENDENT_AMBULATORY_CARE_PROVIDER_SITE_OTHER): Payer: Self-pay | Admitting: Family Medicine

## 2019-02-23 VITALS — BP 130/64 | HR 97 | Ht 61.0 in | Wt 208.4 lb

## 2019-02-23 DIAGNOSIS — M545 Low back pain: Secondary | ICD-10-CM

## 2019-02-23 DIAGNOSIS — G8929 Other chronic pain: Secondary | ICD-10-CM

## 2019-02-23 MED ORDER — CYCLOBENZAPRINE HCL 10 MG PO TABS: 10.0000 mg | ORAL_TABLET | Freq: Every day | ORAL | 1 refills | Status: DC

## 2019-02-23 NOTE — Patient Instructions (Signed)
Thank you for coming in to see Wendy Duffy today! Please see below to review our plan for today's visit:  1. I have ordered an Xray of your low back. Please get this done soon! 2. Physical therapy referral - look out for a call from a number you may not recognize. 3. Take Flexeril 10mg  at night before bedtime (about 8pm).  4. Stay hydrated to help flush out bad metabolites. 5. Get yourself an exercise/yoga ball to sit on at home.   Please call the clinic at [phone] if your symptoms worsen or you have any concerns. It was our pleasure to serve you!  Dr. Milus Banister [facility]

## 2019-02-23 NOTE — Progress Notes (Signed)
   Subjective:    Patient ID: Wendy Duffy, female    DOB: 10-28-1975, 43 y.o.   MRN: [id number]   CC: low back pain  HPI:  Low back pain: This is a very pleasant patient presenting today with acutely worsening low back pain in the lumbar region.  She states that her pain started in about 2015 when she was working a lot on her feet, reports that her weight gain did not help (weight today 206 pounds, BMI 39).  She describes the pain as constant achy pain, 9/10 in severity.  She also reports having some numbness in her right outer thigh.  The pain is worse with trying to relax and lean back from sitting upright.  In the past, she has been evaluated and treated by a chiropractor (did not like all of the cracks and clicks and pops), body massager from [facility], topical icy hot, stretching, as well as Tylenol and ibuprofen/Aleve.  She denies any fevers, shortness of breath, chest pain, nausea, vomiting, abdominal pain, rashes, and focal neurological deficits.  Smoking status reviewed - nonsmoker  Review of Systems-see HPI   Objective:  BP 130/64   Pulse 97   Ht 5\' 1"  (1.549 m)   Wt 208 lb 6 oz (94.5 kg)   LMP 09/20/2015 (Exact Date)   SpO2 99%   BMI 39.37 kg/m  Vitals and nursing note reviewed  Physical exam General: well nourished, in no acute distress Cardiac: RRR, clear S1 and S2, no murmurs or gallops appreciated Respiratory: CTA bilaterally, normal work of breathing Abdomen: soft, nontender, nondistended, no masses or organomegaly. Bowel sounds present Spine: No deformity or abrasion upon inspection; hypertonicity appreciated to palpation with moderate tenderness to palpation; range of motion with side bending, flexion, extension, and rotation slightly reduced secondary to pain and discomfort; negative Trendelenburg, negative straight leg raise Extremities: Trace edema to bilateral lower extremities, well perfused, no deformity Neuro: alert and oriented, no focal deficits   Assessment  & Plan:   Back pain Patient experiencing acute on chronic back pain that is MSK in nature.  Due to patient's right outer thigh numbness could consider impinged nerve (L2-3 lateral cutaneous). -Prescribed Flexeril 10 mg at night -Can continue ibuprofen with Tylenol for pain relief -Can continue using massager, heating pad -Referral to physical therapy -X-ray of lumbar spine ordered  Return in about 6 weeks (around 04/06/2019) for Follow up for Back Pain.   Dr. Milus Banister [facility], PGY-2

## 2019-02-28 NOTE — Assessment & Plan Note (Addendum)
Patient experiencing acute on chronic back pain that is MSK in nature.  Due to patient's right outer thigh numbness could consider impinged nerve (L2-3 lateral cutaneous). -Prescribed Flexeril 10 mg at night -Can continue ibuprofen with Tylenol for pain relief -Can continue using massager, heating pad -Referral to physical therapy -X-ray of lumbar spine ordered

## 2019-03-31 ENCOUNTER — Other Ambulatory Visit: Payer: Self-pay | Admitting: Family Medicine

## 2019-03-31 MED ORDER — HYDROCHLOROTHIAZIDE 25 MG PO TABS: 25.0000 mg | ORAL_TABLET | Freq: Every day | ORAL | 3 refills | Status: DC

## 2019-04-25 ENCOUNTER — Other Ambulatory Visit: Payer: Self-pay | Admitting: Family Medicine

## 2019-04-25 DIAGNOSIS — Z1231 Encounter for screening mammogram for malignant neoplasm of breast: Secondary | ICD-10-CM

## 2019-04-29 ENCOUNTER — Other Ambulatory Visit (HOSPITAL_COMMUNITY): Payer: Self-pay | Admitting: Psychiatry

## 2019-04-29 DIAGNOSIS — F411 Generalized anxiety disorder: Secondary | ICD-10-CM

## 2019-04-29 DIAGNOSIS — F331 Major depressive disorder, recurrent, moderate: Secondary | ICD-10-CM

## 2019-05-11 ENCOUNTER — Encounter (HOSPITAL_COMMUNITY): Payer: Self-pay

## 2019-05-11 ENCOUNTER — Emergency Department (HOSPITAL_COMMUNITY)
Admission: EM | Admit: 2019-05-11 | Discharge: 2019-05-12 | Disposition: A | Discharge status: Home / Self Care | Payer: 59 | Attending: Emergency Medicine | Admitting: Emergency Medicine

## 2019-05-11 DIAGNOSIS — R9431 Abnormal electrocardiogram [ECG] [EKG]: Secondary | ICD-10-CM | POA: Insufficient documentation

## 2019-05-11 DIAGNOSIS — I10 Essential (primary) hypertension: Secondary | ICD-10-CM | POA: Diagnosis not present

## 2019-05-11 DIAGNOSIS — T50901A Poisoning by unspecified drugs, medicaments and biological substances, accidental (unintentional), initial encounter: Secondary | ICD-10-CM | POA: Insufficient documentation

## 2019-05-11 DIAGNOSIS — E876 Hypokalemia: Secondary | ICD-10-CM | POA: Diagnosis not present

## 2019-05-11 DIAGNOSIS — Z79899 Other long term (current) drug therapy: Secondary | ICD-10-CM | POA: Insufficient documentation

## 2019-05-11 LAB — COMPREHENSIVE METABOLIC PANEL
ALT: 22 U/L (ref 0–44)
AST: 18 U/L (ref 15–41)
Albumin: 4 g/dL (ref 3.5–5.0)
Alkaline Phosphatase: 68 U/L (ref 38–126)
Anion gap: 10 (ref 5–15)
BUN: 11 mg/dL (ref 6–20)
CO2: 24 mmol/L (ref 22–32)
Calcium: 9 mg/dL (ref 8.9–10.3)
Chloride: 102 mmol/L (ref 98–111)
Creatinine, Ser: 0.77 mg/dL (ref 0.44–1.00)
GFR calc Af Amer: >60 mL/min (ref >60)
GFR calc non Af Amer: >60 mL/min (ref >60)
Glucose, Bld: 119 mg/dL — ABNORMAL HIGH (ref 70–99)
Potassium: 2.8 mmol/L — ABNORMAL LOW (ref 3.5–5.1)
Sodium: 136 mmol/L (ref 135–145)
Total Bilirubin: 1 mg/dL (ref 0.3–1.2)
Total Protein: 7 g/dL (ref 6.5–8.1)

## 2019-05-11 LAB — CBC WITH DIFFERENTIAL/PLATELET
Abs Immature Granulocytes: 0.02 10*3/uL (ref 0.00–0.07)
Basophils Absolute: 0 10*3/uL (ref 0.0–0.1)
Basophils Relative: 0 %
Eosinophils Absolute: 0.1 10*3/uL (ref 0.0–0.5)
Eosinophils Relative: 2 %
HCT: 42.6 % (ref 36.0–46.0)
Hemoglobin: 13.6 g/dL (ref 12.0–15.0)
Immature Granulocytes: 0 %
Lymphocytes Relative: 13 %
Lymphs Abs: 0.9 10*3/uL (ref 0.7–4.0)
MCH: 29 pg (ref 26.0–34.0)
MCHC: 31.9 g/dL (ref 30.0–36.0)
MCV: 90.8 fL (ref 80.0–100.0)
Monocytes Absolute: 0.3 10*3/uL (ref 0.1–1.0)
Monocytes Relative: 4 %
Neutro Abs: 5.5 10*3/uL (ref 1.7–7.7)
Neutrophils Relative %: 81 %
Platelets: 290 10*3/uL (ref 150–400)
RBC: 4.69 MIL/uL (ref 3.87–5.11)
RDW: 12.9 % (ref 11.5–15.5)
WBC: 6.8 10*3/uL (ref 4.0–10.5)
nRBC: 0 % (ref 0.0–0.2)

## 2019-05-11 LAB — MAGNESIUM: Magnesium: 1.8 mg/dL (ref 1.7–2.4)

## 2019-05-11 MED ORDER — SODIUM CHLORIDE 0.9 % IV BOLUS
1000.0000 mL | Freq: Once | INTRAVENOUS | Status: AC
  Administered 2019-05-11: 22:00:00 1000 mL via INTRAVENOUS

## 2019-05-11 MED ORDER — MAGNESIUM SULFATE 50 % IJ SOLN: 1.0000 g | Freq: Once | INTRAMUSCULAR | Status: DC

## 2019-05-11 MED ORDER — POTASSIUM CHLORIDE CRYS ER 20 MEQ PO TBCR
40.0000 meq | EXTENDED_RELEASE_TABLET | Freq: Once | ORAL | Status: AC
  Administered 2019-05-11: 40 meq via ORAL
  Filled 2019-05-11: qty 2

## 2019-05-11 MED ORDER — MAGNESIUM SULFATE IN D5W 1-5 GM/100ML-% IV SOLN
1.0000 g | Freq: Once | INTRAVENOUS | Status: AC
  Administered 2019-05-11: 22:00:00 1 g via INTRAVENOUS
  Filled 2019-05-11: qty 100

## 2019-05-11 MED ORDER — POTASSIUM CHLORIDE 10 MEQ/100ML IV SOLN
10.0000 meq | Freq: Once | INTRAVENOUS | Status: AC
  Administered 2019-05-11: 10 meq via INTRAVENOUS
  Filled 2019-05-11: qty 100

## 2019-05-11 MED ORDER — CHARCOAL ACTIVATED PO LIQD
50.0000 g | Freq: Once | ORAL | Status: AC
  Administered 2019-05-11: 22:00:00 50 g via ORAL
  Filled 2019-05-11: qty 240

## 2019-05-11 NOTE — ED Notes (Signed)
Called poison control per MD order. Poison control recommended checking the patients BMP with mag, potassium and calcium. Optimize electrolytes to high normal and repeat the ekg to see if QT below 500. Repeat this cycle if QT not below 500. They recommend IV fluids for tachycardia and mild hypotension. Give activated charcoal per provider digression if patient is alert enough and there is no risk of aspiration at 1 g per kil with a max of 75 grams. If seroquel was long acting monitor patient for 12 hours if it was regular monitor patient for 6 hours. Avoid giving any other medications that prolong the QT.

## 2019-05-11 NOTE — ED Notes (Signed)
Pt tolerated activated charcoal well. Pt has emesis bag and is resting comfortably in bed.

## 2019-05-11 NOTE — ED Provider Notes (Signed)
[facility] DEPT Provider Note   CSN: [id number] Arrival date & time: 05/11/19  2040     History Chief Complaint  Patient presents with  . Drug Overdose    Wendy Duffy is a 44 y.o. female.  The history is provided by the patient. No language interpreter was used.  Drug Overdose     44 year old female with history of anxiety, depression, hypertension, brought here by family member for unintentional drug overdose.  Patient report approximately an hour ago she accidentally took 5 of her Seroquel (300mg ) pills when she meant to take her usual 1 pill/night.  She tries to vomited up without any success.  Since then she endorsed feeling a bit anxious, lightheadedness, and nauseous.  She report no intention of hurting himself.  She recalls taking 2 over-the-counter Tylenol an hour before that for her usual headache.  Furthermore, she took her normal morning medication which include amlodipine, Wellbutrin, and venlafaxine along with hydrochlorothiazide in the morning.  She denies any recent alcohol use or drug use.  She denies any active pain.  No abdominal cramping no chest pain or shortness of breath.  No tongue swelling     Past Medical History:  Diagnosis Date  . Anemia   . Anxiety   . Depression   . Gastritis   . Hemorrhoids   . Hypertension   . Sleep apnea    questionable refered for sleep study will schedule appointment   . Status post laparoscopic hysterectomy 10/21/2015   TLH with BSO    Patient Active Problem List   Diagnosis Date Noted  . MDD (major depressive disorder), recurrent episode ([place]) 07/15/2018  . Cough 03/24/2017  . Scabies infestation 11/12/2016  . Back pain 03/30/2016  . Status post laparoscopic hysterectomy 10/21/2015  . Anemia 09/20/2015  . Pap smear abnormality of cervix/human papillomavirus (HPV) positive 04/09/2015  . MDD (major depressive disorder), recurrent episode, moderate ([place]) 01/15/2015  . GAD (generalized  anxiety disorder) 01/15/2015  . Migraine 01/31/2014  . Birth control 06/14/2012  . Depression 04/06/2012  . Viral URI with cough 07/14/2011  . Preventative health care 12/12/2010  . Anxiety 05/21/2010  . Disturbance in sleep behavior 10/01/2009  . VENTRICULAR HYPERTROPHY, LEFT 06/15/2007  . HYPERTENSION, BENIGN 05/26/2006  . OBESITY, NOS 05/13/2006    Past Surgical History:  Procedure Laterality Date  . BILATERAL SALPINGECTOMY Bilateral 10/21/2015   Procedure: BILATERAL SALPINGECTOMY;  Surgeon: Sherlyn Hay, DO;  Location: [facility];  Service: Gynecology;  Laterality: Bilateral;  . [place], 2003  . LAPAROSCOPIC HYSTERECTOMY N/A 10/21/2015   Procedure: HYSTERECTOMY TOTAL LAPAROSCOPIC;  Surgeon: Sherlyn Hay, DO;  Location: [facility];  Service: Gynecology;  Laterality: N/A;  . WISDOM TOOTH EXTRACTION       OB History    Gravida  6   Para  3   Term  3   Preterm  0   AB  3   Living  3     SAB      TAB      Ectopic      Multiple      Live Births              Family History  Problem Relation Age of Onset  . Alcohol abuse Mother   . Drug abuse Maternal Uncle   . Alcohol abuse Maternal Grandmother   . Drug abuse Maternal Grandmother     Social History   Tobacco Use  .  Smoking status: Never Smoker  . Smokeless tobacco: Never Used  Substance Use Topics  . Alcohol use: Yes    Alcohol/week: 3.0 standard drinks    Types: 1 Glasses of wine, 2 Cans of beer per week    Comment: weekly  . Drug use: No    Types: Marijuana    Comment: last used THC in November 2015. using a few times a year    Home Medications Prior to Admission medications   Medication Sig Start Date End Date Taking? Authorizing Provider  acetaminophen (TYLENOL) 500 MG tablet Take 1,000 mg by mouth every 6 (six) hours as needed for mild pain or headache.   Yes [provider]  amLODipine (NORVASC) 10 MG tablet TAKE ONE TABLET BY MOUTH DAILY Patient taking  differently: Take 10 mg by mouth daily.  04/25/19  Yes Brimage, Vondra, DO  buPROPion (WELLBUTRIN XL) 300 MG 24 hr tablet Take 1 tablet (300 mg total) by mouth daily. 02/16/19  Yes [name], MD  cetirizine (ZYRTEC) 10 MG tablet Take 1 tablet (10 mg total) by mouth daily as needed for allergies. 07/20/16  Yes Phelps, Aviva Signs Y, DO  fluticasone (FLONASE) 50 MCG/ACT nasal spray Place 2 sprays into both nostrils daily. Patient taking differently: Place 2 sprays into both nostrils daily as needed for allergies.  07/20/16  Yes [name] Y, DO  hydrochlorothiazide (HYDRODIURIL) 25 MG tablet Take 1 tablet (25 mg total) by mouth daily. 03/31/19  Yes Brimage, Vondra, DO  losartan (COZAAR) 100 MG tablet TAKE ONE TABLET BY MOUTH DAILY Patient taking differently: Take 100 mg by mouth daily.  03/31/19  Yes Brimage, Vondra, DO  QUEtiapine (SEROQUEL) 300 MG tablet TAKE 1/2 TABLET BY MOUTH EVERY NIGHT AT BEDTIME Patient taking differently: Take 150 mg by mouth at bedtime.  02/16/19  Yes [name], MD  venlafaxine XR (EFFEXOR-XR) 75 MG 24 hr capsule Take 3 capsules (225 mg total) by mouth daily. 02/16/19  Yes [name], MD  cyclobenzaprine (FLEXERIL) 10 MG tablet Take 1 tablet (10 mg total) by mouth at bedtime. Patient not taking: Reported on 05/11/2019 02/23/19   Daisy Floro, DO  omeprazole (PRILOSEC) 40 MG capsule take 1 capsule by mouth once daily Patient not taking: Reported on 05/11/2019 07/28/13   Hairford, Tyler Pita, MD    Allergies    Zoloft [sertraline hcl]  Review of Systems   Review of Systems  All other systems reviewed and are negative.   Physical Exam Updated Vital Signs BP 115/79 (BP Location: Left Arm)   Pulse (!) 126   Temp 98.9 F (37.2 C) (Oral)   Resp 18   LMP 09/20/2015 (Exact Date)   SpO2 95%   Physical Exam Vitals and nursing note reviewed.  Constitutional:      General: She is not in acute distress.    Appearance: She is well-developed.     Comments: Mildly  drowsy but able to answer questions appropriately  HENT:     Head: Atraumatic.  Eyes:     Conjunctiva/sclera: Conjunctivae normal.  Cardiovascular:     Rate and Rhythm: Tachycardia present.     Pulses: Normal pulses.     Heart sounds: Normal heart sounds.  Pulmonary:     Effort: Pulmonary effort is normal.     Breath sounds: Normal breath sounds. No wheezing, rhonchi or rales.  Abdominal:     Palpations: Abdomen is soft.     Tenderness: There is no abdominal tenderness.  Musculoskeletal:  Cervical back: Neck supple.  Skin:    Findings: No rash.  Neurological:     Mental Status: She is alert and oriented to person, place, and time.  Psychiatric:        Mood and Affect: Mood normal.        Speech: Speech normal.        Behavior: Behavior is cooperative.        Thought Content: Thought content does not include homicidal ideation.     ED Results / Procedures / Treatments   Labs (all labs ordered are listed, but only abnormal results are displayed) Labs Reviewed  COMPREHENSIVE METABOLIC PANEL - Abnormal; Notable for the following components:      Result Value   Potassium 2.8 (*)    Glucose, Bld 119 (*)    All other components within normal limits  CBC WITH DIFFERENTIAL/PLATELET  MAGNESIUM    EKG EKG Interpretation  Date/Time:  Thursday May 11 2019 20:56:40 EST Ventricular Rate:  127 PR Interval:    QRS Duration: 62 QT Interval:  366 QTC Calculation: 532 R Axis:   37 Text Interpretation: Sinus tachycardia Borderline T abnormalities, inferior leads Prolonged QT interval Baseline wander in lead(s) III aVL Confirmed by Madalyn Rob [phone]) on 05/11/2019 9:41:29 PM   Radiology No results found.  Procedures Procedures (including critical care time)  Medications Ordered in ED Medications  potassium chloride 10 mEq in 100 mL IVPB (10 mEq Intravenous New Bag/Given 05/11/19 2335)  charcoal activated (NO SORBITOL) (ACTIDOSE-AQUA) suspension 50 g (50 g Oral  Given 05/11/19 2204)  sodium chloride 0.9 % bolus 1,000 mL (0 mLs Intravenous Stopped 05/11/19 2257)  magnesium sulfate IVPB 1 g 100 mL (0 g Intravenous Stopped 05/11/19 2303)  potassium chloride SA (KLOR-CON) CR tablet 40 mEq (40 mEq Oral Given 05/11/19 2331)    ED Course  I have reviewed the triage vital signs and the nursing notes.  Pertinent labs & imaging results that were available during my care of the patient were reviewed by me and considered in my medical decision making (see chart for details).    MDM Rules/Calculators/A&P                      BP 115/79 (BP Location: Left Arm)   Pulse 97   Temp 98.9 F (37.2 C) (Oral)   Resp 18   LMP 09/20/2015 (Exact Date)   SpO2 96%   Final Clinical Impression(s) / ED Diagnoses Final diagnoses:  Overdose, drug, accidental or unintentional, initial encounter  Prolonged Q-T interval on ECG  Hypokalemia    Rx / DC Orders ED Discharge Orders    None     9:48 PM Patient here for evaluation of accidental overdose.  She took 5 pills of Seroquel 300 mg approximately 1 hour ago.  She is currently tachycardic, hypotensive, with an EKG did show prolonged QT with a QTC of 537.  Poison control have been consulted who recommend to assess patient's labs, correction of electrolytes abnormalities, give magnesium, and activated charcoal as provider's discretion.  I discussed care with Dr. Roslynn Amble, will give activated charcoal, magnesium, IV fluid, patient placed on cardiac monitor.  11:15 PM Labs remarkable for potassium of 3.8.  EKG without U waves.  We will give potassium supplementation.  Patient tolerates activated charcoal.  12:17 AM Pt sign out to Dr. Leonette Monarch who will f/u on labs, reassess and repeat EKG to ensure improvement of QT.     Domenic Moras, PA-C 05/12/19  [id number]    Fatima Blank, MD 05/12/19 [id number]

## 2019-05-11 NOTE — ED Notes (Signed)
Pt is a/o and slightly lethargic. Pt is tachycardic with other wise stable vitals. Per patient she accidentally mixed up her medications and took 5 extra Seroquel.

## 2019-05-11 NOTE — ED Triage Notes (Signed)
Pt presents with c/o accidental overdose. Pt reports that she got her medication mixed up while taking her nightly meds and accidentally took extra seroquel, approx 5. Pt reports she feels drowsy and feels like her heart is racing.

## 2019-05-12 NOTE — ED Provider Notes (Signed)
Briefly, the patient is a 44 y.o. female with h/o anxiety, depression, hypertension, brought here by family member for unintentional drug overdose.  Vitals:   05/12/19 0215 05/12/19 0230  BP: 112/78 111/75  Pulse: 95 95  Resp: 18 18  Temp:    SpO2: 95% 95%    CONSTITUTIONAL:  well-appearing, NAD NEURO:  Alert and oriented x 3, no focal deficits EYES:  pupils equal and reactive ENT/NECK:  trachea midline, no JVD CARDIO:  reg rate, reg rhythm, well-perfused PULM:  None labored breathing GI/GU:  Abdomin non-distended MSK/SPINE:  No gross deformities, no edema SKIN:  no rash, atraumatic PSYCH:  Appropriate speech and behavior  Work up reassuring. EKG with prolong QTc which improved on susequent EKG. Mild hypokalemia - IV repletion. Patient received activated charcoal.  Monitored for 6 hrs and remained stable.   Medically cleared.    The patient appears reasonably screened and/or stabilized for discharge and I doubt any other medical condition or other [facility] requiring further screening, evaluation, or treatment in the ED at this time prior to discharge. Safe for discharge with strict return precautions.  Disposition: Discharge  Condition: Good  I have discussed the results, Dx and Tx plan with the patient/family who expressed understanding and agree(s) with the plan. Discharge instructions discussed at length. The patient/family was given strict return precautions who verbalized understanding of the instructions. No further questions at time of discharge.    ED Discharge Orders    None       Follow Up: Primary care provider  Schedule an appointment as soon as possible for a visit in 2 weeks to follow up for potassium level      Tamyra Fojtik, Grayce Sessions, MD 05/12/19 0301

## 2019-05-15 ENCOUNTER — Other Ambulatory Visit: Payer: Self-pay

## 2019-05-15 ENCOUNTER — Encounter (HOSPITAL_BASED_OUTPATIENT_CLINIC_OR_DEPARTMENT_OTHER): Payer: Self-pay | Admitting: Orthopedic Surgery

## 2019-05-15 NOTE — Progress Notes (Signed)
Spoke w/ via phone for pre-op interview---Deretha Lab needs dos----  none            Lab results------cbc with dif, cmet, 05-11-2019 epic, ekg 05-12-2019 epic COVID test ------05-16-2019 Arrive at -------1100 am 05-19-2019 NPO after ------midnight food, clear liquids until 700 am then npo Medications to take morning of surgery -----bupropion, venlafaxine xr, amlodipine Diabetic medication -----n/a Patient Special Instructions ----- Pre-Op special Istructions ----- Patient verbalized understanding of instructions that were given at this phone interview. Patient denies shortness of breath, chest pain, fever, cough a this phone interview.

## 2019-05-16 ENCOUNTER — Other Ambulatory Visit (HOSPITAL_COMMUNITY)
Admission: RE | Admit: 2019-05-16 | Discharge: 2019-05-16 | Disposition: A | Discharge status: Home / Self Care | Payer: 59 | Source: Ambulatory Visit | Attending: Orthopedic Surgery | Admitting: Orthopedic Surgery

## 2019-05-16 DIAGNOSIS — Z20822 Contact with and (suspected) exposure to covid-19: Secondary | ICD-10-CM | POA: Diagnosis not present

## 2019-05-16 DIAGNOSIS — Z01812 Encounter for preprocedural laboratory examination: Secondary | ICD-10-CM | POA: Insufficient documentation

## 2019-05-16 LAB — SARS CORONAVIRUS 2 (TAT 6-24 HRS): SARS Coronavirus 2: NEGATIVE

## 2019-05-18 ENCOUNTER — Other Ambulatory Visit: Payer: Self-pay

## 2019-05-18 ENCOUNTER — Encounter (HOSPITAL_COMMUNITY): Payer: Self-pay | Admitting: Psychiatry

## 2019-05-18 ENCOUNTER — Ambulatory Visit (INDEPENDENT_AMBULATORY_CARE_PROVIDER_SITE_OTHER): Payer: 59 | Admitting: Psychiatry

## 2019-05-18 DIAGNOSIS — F422 Mixed obsessional thoughts and acts: Secondary | ICD-10-CM

## 2019-05-18 DIAGNOSIS — F411 Generalized anxiety disorder: Secondary | ICD-10-CM

## 2019-05-18 DIAGNOSIS — F331 Major depressive disorder, recurrent, moderate: Secondary | ICD-10-CM

## 2019-05-18 MED ORDER — BUPROPION HCL ER (XL) 300 MG PO TB24: 300.0000 mg | ORAL_TABLET | Freq: Every day | ORAL | 1 refills | Status: DC

## 2019-05-18 MED ORDER — VENLAFAXINE HCL ER 75 MG PO CP24: 225.0000 mg | ORAL_CAPSULE | Freq: Every day | ORAL | 1 refills | Status: DC

## 2019-05-18 MED ORDER — QUETIAPINE FUMARATE 300 MG PO TABS: ORAL_TABLET | ORAL | 1 refills | Status: DC

## 2019-05-18 NOTE — Progress Notes (Signed)
Virtual Visit via Telephone Note  I connected with Wendy Duffy on 05/18/19 at  1:30 PM EST by telephone and verified that I am speaking with the correct person using two identifiers.  Location: Patient: driving in car Provider: office   I discussed the limitations, risks, security and privacy concerns of performing an evaluation and management service by telephone and the availability of in person appointments. I also discussed with the patient that there may be a patient responsible charge related to this service. The patient expressed understanding and agreed to proceed.   History of Present Illness: "stressful". She fell in a restaurant in December and hurt her knee. She has worked since then. Surgery is tomorrow and recovery will take 4-6 months. It causing financial problems. Terrell had to put her down earlier this week. 2-3 days a week she is very depressed. She has a sense of worthlessness because she is not contributing to her household. She has nothing to do most days. At least 2x/week she feels very anxious and has to use coping skills to calm down. Sleep is poor and she is falling asleep at 2am and wakes up at 9am. She does not nap during the day. She tries to clean her house and watches tv.    Observations/Objective:  General Appearance: unable to assess  Eye Contact:  unable to assess  Speech:  Clear and Coherent and Normal Rate  Volume:  Normal  Mood:  Depressed  Affect:  Congruent  Thought Process:  Goal Directed, Linear and Descriptions of Associations: Intact  Orientation:  Full (Time, Place, and Person)  Thought Content:  Logical  Suicidal Thoughts:  No  Homicidal Thoughts:  No  Memory:  Immediate;   Good  Judgement:  Good  Insight:  Good  Psychomotor Activity: unable to assess  Concentration:  Concentration: Good  Recall:  Good  Fund of Knowledge:  Good  Language:  Good  Akathisia:  unable to assess  Handed:  Right  AIMS (if indicated):     Assets:   Communication Skills Desire for Improvement Housing Resilience Social Support Talents/Skills Transportation  ADL's:  unable to assess  Cognition:  WNL  Sleep:         Assessment and Plan: MDD-recurrent, moderate; GAD; OCD  Effexor XR 225mg  po qD  Seroquel 150mg  po qHS  Wellbutrin XL 150mg  po qD Pt does not want her meds changed today  Follow Up Instructions: In 2-3 months or sooner if needed   I discussed the assessment and treatment plan with the patient. The patient was provided an opportunity to ask questions and all were answered. The patient agreed with the plan and demonstrated an understanding of the instructions.   The patient was advised to call back or seek an in-person evaluation if the symptoms worsen or if the condition fails to improve as anticipated.  I provided 15 minutes of non-face-to-face time during this encounter.   [name], MD

## 2019-05-19 ENCOUNTER — Encounter (HOSPITAL_BASED_OUTPATIENT_CLINIC_OR_DEPARTMENT_OTHER): Payer: Self-pay | Admitting: Orthopedic Surgery

## 2019-05-19 ENCOUNTER — Ambulatory Visit (HOSPITAL_COMMUNITY): Payer: 59

## 2019-05-19 ENCOUNTER — Other Ambulatory Visit: Payer: Self-pay

## 2019-05-19 ENCOUNTER — Ambulatory Visit (HOSPITAL_BASED_OUTPATIENT_CLINIC_OR_DEPARTMENT_OTHER): Payer: 59 | Admitting: Anesthesiology

## 2019-05-19 ENCOUNTER — Encounter (HOSPITAL_BASED_OUTPATIENT_CLINIC_OR_DEPARTMENT_OTHER)
Admission: RE | Disposition: A | Discharge status: Home / Self Care | Payer: Self-pay | Source: Home / Self Care | Attending: Orthopedic Surgery

## 2019-05-19 ENCOUNTER — Ambulatory Visit (HOSPITAL_BASED_OUTPATIENT_CLINIC_OR_DEPARTMENT_OTHER)
Admission: RE | Admit: 2019-05-19 | Discharge: 2019-05-19 | Disposition: A | Discharge status: Home / Self Care | Payer: 59 | Attending: Orthopedic Surgery | Admitting: Orthopedic Surgery

## 2019-05-19 DIAGNOSIS — S83005A Unspecified dislocation of left patella, initial encounter: Secondary | ICD-10-CM | POA: Diagnosis not present

## 2019-05-19 DIAGNOSIS — E669 Obesity, unspecified: Secondary | ICD-10-CM | POA: Insufficient documentation

## 2019-05-19 DIAGNOSIS — F329 Major depressive disorder, single episode, unspecified: Secondary | ICD-10-CM | POA: Insufficient documentation

## 2019-05-19 DIAGNOSIS — Z6838 Body mass index (BMI) 38.0-38.9, adult: Secondary | ICD-10-CM | POA: Insufficient documentation

## 2019-05-19 DIAGNOSIS — F419 Anxiety disorder, unspecified: Secondary | ICD-10-CM | POA: Insufficient documentation

## 2019-05-19 DIAGNOSIS — I10 Essential (primary) hypertension: Secondary | ICD-10-CM | POA: Insufficient documentation

## 2019-05-19 DIAGNOSIS — M94262 Chondromalacia, left knee: Secondary | ICD-10-CM | POA: Diagnosis not present

## 2019-05-19 DIAGNOSIS — S83282A Other tear of lateral meniscus, current injury, left knee, initial encounter: Secondary | ICD-10-CM | POA: Insufficient documentation

## 2019-05-19 DIAGNOSIS — W19XXXA Unspecified fall, initial encounter: Secondary | ICD-10-CM | POA: Diagnosis not present

## 2019-05-19 DIAGNOSIS — S76112A Strain of left quadriceps muscle, fascia and tendon, initial encounter: Secondary | ICD-10-CM | POA: Insufficient documentation

## 2019-05-19 DIAGNOSIS — K219 Gastro-esophageal reflux disease without esophagitis: Secondary | ICD-10-CM | POA: Diagnosis not present

## 2019-05-19 DIAGNOSIS — Z419 Encounter for procedure for purposes other than remedying health state, unspecified: Secondary | ICD-10-CM

## 2019-05-19 DIAGNOSIS — Z79899 Other long term (current) drug therapy: Secondary | ICD-10-CM | POA: Diagnosis not present

## 2019-05-19 DIAGNOSIS — M25562 Pain in left knee: Secondary | ICD-10-CM | POA: Diagnosis present

## 2019-05-19 HISTORY — DX: Other instability, left knee: M25.362

## 2019-05-19 HISTORY — DX: Unspecified osteoarthritis, unspecified site: M19.90

## 2019-05-19 HISTORY — DX: Cardiomegaly: I51.7

## 2019-05-19 HISTORY — PX: KNEE ARTHROSCOPY WITH MEDIAL PATELLAR FEMORAL LIGAMENT RECONSTRUCTION: SHX5652

## 2019-05-19 HISTORY — DX: Gastro-esophageal reflux disease without esophagitis: K21.9

## 2019-05-19 SURGERY — REPAIR, TENDON, PATELLAR, ARTHROSCOPIC
Anesthesia: General | Site: Knee | Laterality: Left

## 2019-05-19 MED ORDER — DEXAMETHASONE SODIUM PHOSPHATE 10 MG/ML IJ SOLN
INTRAMUSCULAR | Status: DC | PRN
  Administered 2019-05-19: 8 mg via INTRAVENOUS

## 2019-05-19 MED ORDER — FENTANYL CITRATE (PF) 100 MCG/2ML IJ SOLN
INTRAMUSCULAR | Status: AC
  Filled 2019-05-19: qty 2

## 2019-05-19 MED ORDER — FENTANYL CITRATE (PF) 100 MCG/2ML IJ SOLN
INTRAMUSCULAR | Status: DC | PRN
  Administered 2019-05-19: 25 ug via INTRAVENOUS
  Administered 2019-05-19: 50 ug via INTRAVENOUS
  Administered 2019-05-19: 25 ug via INTRAVENOUS
  Administered 2019-05-19 (×2): 50 ug via INTRAVENOUS

## 2019-05-19 MED ORDER — KETOROLAC TROMETHAMINE 30 MG/ML IJ SOLN
INTRAMUSCULAR | Status: AC
  Filled 2019-05-19: qty 1

## 2019-05-19 MED ORDER — HYDROMORPHONE HCL 2 MG/ML IJ SOLN
INTRAMUSCULAR | Status: AC
  Filled 2019-05-19: qty 1

## 2019-05-19 MED ORDER — PROPOFOL 10 MG/ML IV BOLUS
INTRAVENOUS | Status: AC
  Filled 2019-05-19: qty 20

## 2019-05-19 MED ORDER — SODIUM CHLORIDE 0.9 % IR SOLN
Status: DC | PRN
  Administered 2019-05-19: 3000 mL

## 2019-05-19 MED ORDER — LIDOCAINE 2% (20 MG/ML) 5 ML SYRINGE
INTRAMUSCULAR | Status: AC
  Filled 2019-05-19: qty 5

## 2019-05-19 MED ORDER — PROMETHAZINE HCL 25 MG/ML IJ SOLN
6.2500 mg | INTRAMUSCULAR | Status: DC | PRN
  Filled 2019-05-19: qty 1

## 2019-05-19 MED ORDER — METHOCARBAMOL 1000 MG/10ML IJ SOLN
500.0000 mg | Freq: Once | INTRAVENOUS | Status: AC
  Administered 2019-05-19: 500 mg via INTRAVENOUS
  Filled 2019-05-19: qty 5
  Filled 2019-05-19: qty 500

## 2019-05-19 MED ORDER — CEFAZOLIN SODIUM-DEXTROSE 2-4 GM/100ML-% IV SOLN
INTRAVENOUS | Status: AC
  Filled 2019-05-19: qty 100

## 2019-05-19 MED ORDER — MIDAZOLAM HCL 2 MG/2ML IJ SOLN
INTRAMUSCULAR | Status: DC | PRN
  Administered 2019-05-19: 2 mg via INTRAVENOUS

## 2019-05-19 MED ORDER — TRAMADOL HCL 50 MG PO TABS: 50.0000 mg | ORAL_TABLET | Freq: Four times a day (QID) | ORAL | 0 refills | Status: DC | PRN

## 2019-05-19 MED ORDER — BUPIVACAINE HCL (PF) 0.25 % IJ SOLN
INTRAMUSCULAR | Status: DC | PRN
  Administered 2019-05-19: 40 mL

## 2019-05-19 MED ORDER — GLYCOPYRROLATE PF 0.2 MG/ML IJ SOSY
PREFILLED_SYRINGE | INTRAMUSCULAR | Status: DC | PRN
  Administered 2019-05-19: .2 mg via INTRAVENOUS

## 2019-05-19 MED ORDER — OXYCODONE HCL 5 MG PO TABS
ORAL_TABLET | ORAL | Status: AC
  Filled 2019-05-19: qty 1

## 2019-05-19 MED ORDER — ACETAMINOPHEN 10 MG/ML IV SOLN
INTRAVENOUS | Status: AC
  Filled 2019-05-19: qty 100

## 2019-05-19 MED ORDER — ONDANSETRON 4 MG PO TBDP: 4.0000 mg | ORAL_TABLET | Freq: Three times a day (TID) | ORAL | 0 refills | Status: DC | PRN

## 2019-05-19 MED ORDER — LACTATED RINGERS IV SOLN
INTRAVENOUS | Status: DC
  Filled 2019-05-19: qty 1000

## 2019-05-19 MED ORDER — OXYCODONE HCL 5 MG/5ML PO SOLN
5.0000 mg | Freq: Once | ORAL | Status: AC | PRN
  Filled 2019-05-19: qty 5

## 2019-05-19 MED ORDER — LIDOCAINE 2% (20 MG/ML) 5 ML SYRINGE
INTRAMUSCULAR | Status: DC | PRN
  Administered 2019-05-19: 100 mg via INTRAVENOUS

## 2019-05-19 MED ORDER — ONDANSETRON HCL 4 MG/2ML IJ SOLN
INTRAMUSCULAR | Status: AC
  Filled 2019-05-19: qty 2

## 2019-05-19 MED ORDER — CEFAZOLIN SODIUM-DEXTROSE 2-4 GM/100ML-% IV SOLN
2.0000 g | INTRAVENOUS | Status: AC
  Administered 2019-05-19: 2 g via INTRAVENOUS
  Filled 2019-05-19: qty 100

## 2019-05-19 MED ORDER — OXYCODONE HCL 5 MG PO TABS
5.0000 mg | ORAL_TABLET | Freq: Once | ORAL | Status: AC | PRN
  Administered 2019-05-19: 5 mg via ORAL
  Filled 2019-05-19: qty 1

## 2019-05-19 MED ORDER — HYDROMORPHONE HCL 1 MG/ML IJ SOLN
INTRAMUSCULAR | Status: DC | PRN
  Administered 2019-05-19: .5 mg via INTRAVENOUS
  Administered 2019-05-19 (×2): .25 mg via INTRAVENOUS

## 2019-05-19 MED ORDER — PROPOFOL 10 MG/ML IV BOLUS
INTRAVENOUS | Status: DC | PRN
  Administered 2019-05-19: 30 mg via INTRAVENOUS
  Administered 2019-05-19: 20 mg via INTRAVENOUS
  Administered 2019-05-19: 200 mg via INTRAVENOUS

## 2019-05-19 MED ORDER — OXYCODONE HCL 5 MG PO TABS: 5.0000 mg | ORAL_TABLET | Freq: Four times a day (QID) | ORAL | 0 refills | Status: AC | PRN

## 2019-05-19 MED ORDER — METHOCARBAMOL 500 MG PO TABS: 500.0000 mg | ORAL_TABLET | Freq: Four times a day (QID) | ORAL | 1 refills | Status: DC | PRN

## 2019-05-19 MED ORDER — MIDAZOLAM HCL 2 MG/2ML IJ SOLN
INTRAMUSCULAR | Status: AC
  Filled 2019-05-19: qty 2

## 2019-05-19 MED ORDER — KETOROLAC TROMETHAMINE 30 MG/ML IJ SOLN
INTRAMUSCULAR | Status: DC | PRN
  Administered 2019-05-19: 30 mg via INTRAVENOUS

## 2019-05-19 MED ORDER — FENTANYL CITRATE (PF) 100 MCG/2ML IJ SOLN
25.0000 ug | INTRAMUSCULAR | Status: DC | PRN
  Administered 2019-05-19: 50 ug via INTRAVENOUS
  Filled 2019-05-19: qty 1

## 2019-05-19 MED ORDER — ACETAMINOPHEN 10 MG/ML IV SOLN
INTRAVENOUS | Status: DC | PRN
  Administered 2019-05-19: 1000 mg via INTRAVENOUS

## 2019-05-19 MED ORDER — CHLORHEXIDINE GLUCONATE 4 % EX LIQD
60.0000 mL | Freq: Once | CUTANEOUS | Status: DC
  Filled 2019-05-19: qty 118

## 2019-05-19 MED ORDER — DEXAMETHASONE SODIUM PHOSPHATE 10 MG/ML IJ SOLN
INTRAMUSCULAR | Status: AC
  Filled 2019-05-19: qty 1

## 2019-05-19 MED ORDER — ONDANSETRON HCL 4 MG/2ML IJ SOLN
INTRAMUSCULAR | Status: DC | PRN
  Administered 2019-05-19 (×2): 4 mg via INTRAVENOUS

## 2019-05-19 MED ORDER — GLYCOPYRROLATE PF 0.2 MG/ML IJ SOSY
PREFILLED_SYRINGE | INTRAMUSCULAR | Status: AC
  Filled 2019-05-19: qty 1

## 2019-05-19 SURGICAL SUPPLY — 88 items
ANCH SUT SWLK 19.1X4.75 (Anchor) ×1 IMPLANT
ANCHOR SUT BIO SW 4.75X19.1 (Anchor) ×1 IMPLANT
BANDAGE ESMARK 6X9 LF (GAUZE/BANDAGES/DRESSINGS) ×1 IMPLANT
BLADE CUTTER GATOR 3.5 (BLADE) ×2 IMPLANT
BLADE SHAVER TORPEDO 4X13 (MISCELLANEOUS) ×1 IMPLANT
BLADE SURG 10 STRL SS (BLADE) IMPLANT
BLADE SURG 15 STRL LF DISP TIS (BLADE) ×2 IMPLANT
BLADE SURG 15 STRL SS (BLADE) ×2
BNDG CMPR 9X6 STRL LF SNTH (GAUZE/BANDAGES/DRESSINGS) ×1
BNDG ELASTIC 6X5.8 VLCR STR LF (GAUZE/BANDAGES/DRESSINGS) ×2 IMPLANT
BNDG ESMARK 6X9 LF (GAUZE/BANDAGES/DRESSINGS) ×2
BNDG GAUZE ELAST 4 BULKY (GAUZE/BANDAGES/DRESSINGS) ×1 IMPLANT
BUR OVAL 6.0 (BURR) IMPLANT
COVER BACK TABLE 60X90IN (DRAPES) ×1 IMPLANT
COVER WAND RF STERILE (DRAPES) ×3 IMPLANT
CUFF TOURN SGL QUICK 34 (TOURNIQUET CUFF) ×2
CUFF TRNQT CYL 34X4.125X (TOURNIQUET CUFF) ×1 IMPLANT
DRAPE ARTHROSCOPY W/POUCH 114 (DRAPES) ×2 IMPLANT
DRAPE C-ARM 42X120 X-RAY (DRAPES) ×2 IMPLANT
DRAPE INCISE IOBAN 66X45 STRL (DRAPES) IMPLANT
DRAPE SHEET LG 3/4 BI-LAMINATE (DRAPES) ×2 IMPLANT
DRAPE U-SHAPE 47X51 STRL (DRAPES) ×2 IMPLANT
DRSG PAD ABDOMINAL 8X10 ST (GAUZE/BANDAGES/DRESSINGS) ×3 IMPLANT
DURAPREP 26ML APPLICATOR (WOUND CARE) ×2 IMPLANT
ELECT REM PT RETURN 9FT ADLT (ELECTROSURGICAL)
ELECTRODE REM PT RTRN 9FT ADLT (ELECTROSURGICAL) IMPLANT
FIBERSTICK 2 (SUTURE) IMPLANT
GAUZE SPONGE 4X4 12PLY STRL (GAUZE/BANDAGES/DRESSINGS) ×2 IMPLANT
GAUZE SPONGE 4X4 12PLY STRL LF (GAUZE/BANDAGES/DRESSINGS) ×1 IMPLANT
GAUZE XEROFORM 1X8 LF (GAUZE/BANDAGES/DRESSINGS) ×2 IMPLANT
GLOVE BIO SURGEON STRL SZ7.5 (GLOVE) ×2 IMPLANT
GLOVE BIOGEL PI IND STRL 8 (GLOVE) ×1 IMPLANT
GLOVE BIOGEL PI INDICATOR 8 (GLOVE) ×1
GOWN STRL REUS W/ TWL LRG LVL3 (GOWN DISPOSABLE) ×1 IMPLANT
GOWN STRL REUS W/TWL LRG LVL3 (GOWN DISPOSABLE) ×2
GRAFT ROPE FROZEN (Tissue) ×2 IMPLANT
GRAFT TISS ROPE SEMITEND 4-5.5 (Tissue) IMPLANT
IMMOBILIZER KNEE 22 UNIV (SOFTGOODS) IMPLANT
IMMOBILIZER KNEE 24 THIGH 36 (MISCELLANEOUS) IMPLANT
IMMOBILIZER KNEE 24 UNIV (MISCELLANEOUS)
IV NS IRRIG 3000ML ARTHROMATIC (IV SOLUTION) ×3 IMPLANT
KIT BIO-TENODESIS 3X8 DISP (MISCELLANEOUS)
KIT INSRT BABSR STRL DISP BTN (MISCELLANEOUS) IMPLANT
KIT TRANSTIBIAL (DISPOSABLE) IMPLANT
KIT TURNOVER CYSTO (KITS) ×2 IMPLANT
MANIFOLD NEPTUNE II (INSTRUMENTS) ×2 IMPLANT
NDL MAYO CATGUT SZ4 TPR NDL (NEEDLE) ×1 IMPLANT
NEEDLE ELECTRODE (NEEDLE) IMPLANT
NEEDLE HYPO 22GX1.5 SAFETY (NEEDLE) ×2 IMPLANT
NEEDLE MAYO CATGUT SZ4 (NEEDLE) ×2 IMPLANT
NS IRRIG 1000ML POUR BTL (IV SOLUTION) ×2 IMPLANT
PACK ARTHROSCOPY DSU (CUSTOM PROCEDURE TRAY) ×2 IMPLANT
PACK BASIN DAY SURGERY FS (CUSTOM PROCEDURE TRAY) ×2 IMPLANT
PACK IMPLANT BIOCOMPOSITE MPFL (Orthopedic Implant) ×1 IMPLANT
PAD ARMBOARD 7.5X6 YLW CONV (MISCELLANEOUS) ×2 IMPLANT
PAD CAST 4YDX4 CTTN HI CHSV (CAST SUPPLIES) ×1 IMPLANT
PADDING CAST COTTON 4X4 STRL (CAST SUPPLIES)
PADDING CAST COTTON 6X4 STRL (CAST SUPPLIES) ×5 IMPLANT
PASSER SUT SWANSON 36MM LOOP (INSTRUMENTS) ×2 IMPLANT
PENCIL BUTTON HOLSTER BLD 10FT (ELECTRODE) ×1 IMPLANT
PROBE BIPOLAR ATHRO 135MM 90D (MISCELLANEOUS) ×1 IMPLANT
SPONGE LAP 4X18 RFD (DISPOSABLE) ×2 IMPLANT
STRIP CLOSURE SKIN 1/2X4 (GAUZE/BANDAGES/DRESSINGS) ×1 IMPLANT
SUCTION FRAZIER HANDLE 10FR (MISCELLANEOUS) ×2
SUCTION TUBE FRAZIER 10FR DISP (MISCELLANEOUS) ×1 IMPLANT
SUT 2 FIBERLOOP 20 STRT BLUE (SUTURE) ×4
SUT FIBERWIRE #2 38 REV NDL BL (SUTURE)
SUT FIBERWIRE #2 38 T-5 BLUE (SUTURE)
SUT MON AB 2-0 CT1 36 (SUTURE) ×2 IMPLANT
SUT MON AB 3-0 SH 27 (SUTURE) ×2
SUT MON AB 3-0 SH27 (SUTURE) ×1 IMPLANT
SUT VIC AB 0 CT2 27 (SUTURE) IMPLANT
SUT VIC AB 2-0 CT2 27 (SUTURE) ×2 IMPLANT
SUT VIC AB 2-0 SH 27 (SUTURE)
SUT VIC AB 2-0 SH 27XBRD (SUTURE) IMPLANT
SUTURE 2 FIBERLOOP 20 STRT BLU (SUTURE) IMPLANT
SUTURE FIBERWR #2 38 T-5 BLUE (SUTURE) IMPLANT
SUTURE FIBERWR#2 38 REV NDL BL (SUTURE) IMPLANT
SUTURE TIGERSTICK 2 TIGERWIR 2 (MISCELLANEOUS) IMPLANT
SYR 20ML LL LF (SYRINGE) ×2 IMPLANT
SYR BULB IRRIGATION 50ML (SYRINGE) ×2 IMPLANT
SYR CONTROL 10ML LL (SYRINGE) IMPLANT
TIGERSTICK 2 TIGERWIRE 2 (MISCELLANEOUS)
TOWEL OR 17X26 10 PK STRL BLUE (TOWEL DISPOSABLE) ×3 IMPLANT
TUBE CONNECTING 12X1/4 (SUCTIONS) ×2 IMPLANT
TUBING ARTHROSCOPY IRRIG 16FT (MISCELLANEOUS) ×2 IMPLANT
WAND HAND CNTRL MULTIVAC 90 (MISCELLANEOUS) IMPLANT
WATER STERILE IRR 500ML POUR (IV SOLUTION) ×2 IMPLANT

## 2019-05-19 NOTE — Transfer of Care (Signed)
    Last Vitals:  Vitals Value Taken Time  BP    Temp 36.6 C 05/19/19 1444  Pulse 102 05/19/19 1447  Resp 19 05/19/19 1447  SpO2 100 % 05/19/19 1447  Vitals shown include unvalidated device data.  Last Pain:  Vitals:   05/19/19 1144  TempSrc: Oral         Complications: Immediate Anesthesia Transfer of Care Note  Patient: Wendy Duffy  Procedure(s) Performed: Procedure(s) (LRB): KNEE ARTHROSCOPY WITH CHONDROPLASTY, MEDIAL PATELLAR FEMORAL LIGAMENT RECONSTRUCTION (Left)  Patient Location: PACU  Anesthesia Type: General  Level of Consciousness: awake, alert  and oriented  Airway & Oxygen Therapy: Patient Spontanous Breathing and Patient connected to nasal cannula oxygen  Post-op Assessment: Report given to PACU RN and Post -op Vital signs reviewed and stable  Post vital signs: Reviewed and stable  Complications: No apparent anesthesia complications

## 2019-05-19 NOTE — H&P (Signed)
ORTHOPAEDIC H and P  REQUESTING PHYSICIAN: Nicholes Stairs, MD  PCP:  Lyndee Hensen, DO  Chief Complaint: left knee pain  HPI: Wendy Duffy is a 44 y.o. female who complains of left knee pain and patellar instability following a fall in a [employer] parking lot.  She is here today for stabilization procedure of the left knee.  No new complaints at this time.  We have previously discussed the recommendation for that surgery at length in the office.  Past Medical History:  Diagnosis Date  . Anemia   . Anxiety   . Arthritis    both knees oa  . Depression   . Enlarged heart    left worked up for 6 yrs ago, benign released by cardiology heart center at [facility]  . Gastritis   . GERD (gastroesophageal reflux disease)   . Hemorrhoids   . Hypertension   . Patellar instability of left knee   . Status post laparoscopic hysterectomy 10/21/2015   TLH with BSO   Past Surgical History:  Procedure Laterality Date  . BILATERAL SALPINGECTOMY Bilateral 10/21/2015   Procedure: BILATERAL SALPINGECTOMY;  Surgeon: Sherlyn Hay, DO;  Location: [facility];  Service: Gynecology;  Laterality: Bilateral;  . [place], 2003  . LAPAROSCOPIC HYSTERECTOMY N/A 10/21/2015   Procedure: HYSTERECTOMY TOTAL LAPAROSCOPIC;  Surgeon: Sherlyn Hay, DO;  Location: [facility];  Service: Gynecology;  Laterality: N/A;  . WISDOM TOOTH EXTRACTION     Social History   Socioeconomic History  . Marital status: Married    Spouse name: Not on file  . Number of children: Not on file  . Years of education: Not on file  . Highest education level: Not on file  Occupational History  . Occupation: Print production planner  Tobacco Use  . Smoking status: Never Smoker  . Smokeless tobacco: Never Used  Substance and Sexual Activity  . Alcohol use: Not Currently  . Drug use: No    Types: Marijuana    Comment: last used marijuana last used 3 weeks ago as of 05-15-2019  . Sexual activity: Yes    Birth  control/protection: None  Other Topics Concern  . Not on file  Social History Narrative  . Not on file   Social Determinants of Health   Financial Resource Strain:   . Difficulty of Paying Living Expenses: Not on file  Food Insecurity:   . Worried About Charity fundraiser in the Last Year: Not on file  . Ran Out of Food in the Last Year: Not on file  Transportation Needs:   . Lack of Transportation (Medical): Not on file  . Lack of Transportation (Non-Medical): Not on file  Physical Activity:   . Days of Exercise per Week: Not on file  . Minutes of Exercise per Session: Not on file  Stress:   . Feeling of Stress : Not on file  Social Connections:   . Frequency of Communication with Friends and Family: Not on file  . Frequency of Social Gatherings with Friends and Family: Not on file  . Attends Religious Services: Not on file  . Active Member of Clubs or Organizations: Not on file  . Attends Archivist Meetings: Not on file  . Marital Status: Not on file   Family History  Problem Relation Age of Onset  . Alcohol abuse Mother   . Drug abuse Maternal Uncle   . Alcohol abuse Maternal Grandmother   . Drug abuse Maternal Grandmother  Allergies  Allergen Reactions  . Zoloft [Sertraline Hcl]     Made her feel very anxious    Prior to Admission medications   Medication Sig Start Date End Date Taking? Authorizing Provider  acetaminophen (TYLENOL) 500 MG tablet Take 1,000 mg by mouth every 6 (six) hours as needed for mild pain or headache.   Yes [provider]  amLODipine (NORVASC) 10 MG tablet TAKE ONE TABLET BY MOUTH DAILY Patient taking differently: Take 10 mg by mouth daily.  04/25/19  Yes Brimage, Vondra, DO  b complex vitamins capsule Take 1 capsule by mouth daily.   Yes [provider]  buPROPion (WELLBUTRIN XL) 300 MG 24 hr tablet Take 1 tablet (300 mg total) by mouth daily. 05/18/19  Yes [name], MD  cetirizine (ZYRTEC) 10 MG tablet  Take 1 tablet (10 mg total) by mouth daily as needed for allergies. 07/20/16  Yes [name] Y, DO  cyclobenzaprine (FLEXERIL) 10 MG tablet Take 1 tablet (10 mg total) by mouth at bedtime. Patient taking differently: Take 10 mg by mouth as needed.  02/23/19  Yes Milus Banister C, DO  ELDERBERRY PO Take by mouth. Gummy daily   Yes [provider]  hydrochlorothiazide (HYDRODIURIL) 25 MG tablet Take 1 tablet (25 mg total) by mouth daily. 03/31/19  Yes Brimage, Vondra, DO  losartan (COZAAR) 100 MG tablet TAKE ONE TABLET BY MOUTH DAILY Patient taking differently: Take 100 mg by mouth daily.  03/31/19  Yes Brimage, Vondra, DO  omeprazole (PRILOSEC) 40 MG capsule take 1 capsule by mouth once daily Patient taking differently: as needed.  07/28/13  Yes Hairford, Tyler Pita, MD  Pediatric Multivit-Minerals-C (RA GUMMY VITAMINS & MINERALS) CHEW Chew by mouth. Vitamin c gummy daily   Yes [provider]  QUEtiapine (SEROQUEL) 300 MG tablet TAKE 1/2 TABLET BY MOUTH EVERY NIGHT AT BEDTIME 05/18/19  Yes [name], MD  UNABLE TO FIND Med Name: vitamin d 3 2000 iu daily   Yes [provider]  UNABLE TO FIND Med Name: bioquercetin 29 mg daily   Yes [provider]  venlafaxine XR (EFFEXOR-XR) 75 MG 24 hr capsule Take 3 capsules (225 mg total) by mouth daily. 05/18/19  Yes [name], MD  Zinc 50 MG CAPS Take by mouth daily.   Yes [provider]  fluticasone (FLONASE) 50 MCG/ACT nasal spray Place 2 sprays into both nostrils daily. Patient taking differently: Place 2 sprays into both nostrils daily as needed for allergies.  07/20/16   Katheren Shams, DO   No results found.  Positive ROS: All other systems have been reviewed and were otherwise negative with the exception of those mentioned in the HPI and as above.  Physical Exam: General: Alert, no acute distress Cardiovascular: No pedal edema Respiratory: No cyanosis, no use of accessory musculature GI: No  organomegaly, abdomen is soft and non-tender Skin: No lesions in the area of chief complaint Neurologic: Sensation intact distally Psychiatric: Patient is competent for consent with normal mood and affect Lymphatic: No axillary or cervical lymphadenopathy  MUSCULOSKELETAL:  Left lower extremity is clean dry and intact.  No open wounds.  Neurovascularly intact.  Assessment: 1.  Left knee chondromalacia 2.  Left knee patellar instability  Plan: -Plan will be for arthroscopic debridement and chondroplasty with mini open reconstruction of the medial patellofemoral ligament.  We again stressed the risk of bleeding, infection, damage to surrounding structures, stiffness, failure of repair, persistent pain, DVT, and the risk of anesthesia.  She has provided informed consent.  -Our plan will be for a Bledsoe brace postoperatively locked in extension with partial weightbearing with crutches.  -Discharge home postoperatively from PACU.    Nicholes Stairs, MD Cell [phone]    05/19/2019 12:18 PM

## 2019-05-19 NOTE — Anesthesia Preprocedure Evaluation (Addendum)
Anesthesia Evaluation  Patient identified by MRN, date of birth, ID band Patient awake    Reviewed: Allergy & Precautions, NPO status , Patient's Chart, lab work & pertinent test results  History of Anesthesia Complications Negative for: history of anesthetic complications  Airway Mallampati: I  TM Distance: >3 FB Neck ROM: Full    Dental  (+) Dental Advisory Given, Teeth Intact   Pulmonary neg pulmonary ROS,    Pulmonary exam normal        Cardiovascular hypertension, Pt. on medications Normal cardiovascular exam     Neuro/Psych  Headaches, PSYCHIATRIC DISORDERS Anxiety Depression    GI/Hepatic Neg liver ROS, GERD  Medicated and Controlled,  Endo/Other   Obesity Hypokalemia   Renal/GU negative Renal ROS     Musculoskeletal  (+) Arthritis , Osteoarthritis,    Abdominal   Peds  Hematology negative hematology ROS (+)   Anesthesia Other Findings Covid neg 05/16/19   Reproductive/Obstetrics  S/p hysterectomy                             Anesthesia Physical Anesthesia Plan  ASA: II  Anesthesia Plan: General   Post-op Pain Management:    Induction: Intravenous  PONV Risk Score and Plan: 4 or greater and Treatment may vary due to age or medical condition, Ondansetron, Dexamethasone and Midazolam  Airway Management Planned: LMA  Additional Equipment: None  Intra-op Plan:   Post-operative Plan: Extubation in OR  Informed Consent: I have reviewed the patients History and Physical, chart, labs and discussed the procedure including the risks, benefits and alternatives for the proposed anesthesia with the patient or authorized representative who has indicated his/her understanding and acceptance.     Dental advisory given  Plan Discussed with: CRNA and Anesthesiologist  Anesthesia Plan Comments:        Anesthesia Quick Evaluation

## 2019-05-19 NOTE — Anesthesia Procedure Notes (Addendum)
Procedure Name: LMA Insertion Date/Time: 05/19/2019 12:44 PM Performed by: Mechele Claude, CRNA Pre-anesthesia Checklist: Patient identified, Emergency Drugs available, Suction available and Patient being monitored Patient Re-evaluated:Patient Re-evaluated prior to induction Oxygen Delivery Method: Circle system utilized Preoxygenation: Pre-oxygenation with 100% oxygen Induction Type: IV induction Ventilation: Mask ventilation without difficulty LMA: LMA inserted LMA Size: 4.0 Number of attempts: 1 Airway Equipment and Method: Bite block Placement Confirmation: positive ETCO2 Tube secured with: Tape Dental Injury: Teeth and Oropharynx as per pre-operative assessment

## 2019-05-19 NOTE — Anesthesia Procedure Notes (Deleted)
Date/Time: 05/19/2019 12:44 PM Performed by: Mechele Claude, CRNA

## 2019-05-19 NOTE — Discharge Instructions (Signed)
-  Maintain postoperative bandage for 3 days.  You then may remove Ace bandage and deep dressings and begin showering at that time.  Please do not submerge underwater.  -Maintain strict elevation with left lower extremity with "toes above nose."  -Return to see Dr. Stann Mainland in 2 weeks for routine postop check.  -Apply ice to the left knee for 30 minutes/h that you are awake.  -Okay for weightbearing as tolerated with crutch assistance.  You should also maintain postoperative brace until your physical therapist progresses your range of motion.  -For the prevention of blood clots take an 81 mg aspirin once per day x6 weeks.   Post Anesthesia Home Care Instructions  Activity: Get plenty of rest for the remainder of the day. A responsible adult should stay with you for 24 hours following the procedure.  For the next 24 hours, DO NOT: -Drive a car -Paediatric nurse -Drink alcoholic beverages -Take any medication unless instructed by your physician -Make any legal decisions or sign important papers.  Meals: Start with liquid foods such as gelatin or soup. Progress to regular foods as tolerated. Avoid greasy, spicy, heavy foods. If nausea and/or vomiting occur, drink only clear liquids until the nausea and/or vomiting subsides. Call your physician if vomiting continues.  Special Instructions/Symptoms: Your throat may feel dry or sore from the anesthesia or the breathing tube placed in your throat during surgery. If this causes discomfort, gargle with warm salt water. The discomfort should disappear within 24 hours.  If you had a scopolamine patch placed behind your ear for the management of post- operative nausea and/or vomiting:  1. The medication in the patch is effective for 72 hours, after which it should be removed.  Wrap patch in a tissue and discard in the trash. Wash hands thoroughly with soap and water. 2. You may remove the patch earlier than 72 hours if you experience unpleasant  side effects which may include dry mouth, dizziness or visual disturbances. 3. Avoid touching the patch. Wash your hands with soap and water after contact with the patch.

## 2019-05-19 NOTE — Op Note (Signed)
05/19/2019  2:43 PM  PATIENT:  Wendy Duffy    PRE-OPERATIVE DIAGNOSIS: Left Patellar instability with medial patellofemoral ligament disruption  POST-OPERATIVE DIAGNOSIS:   1.  Left Patellar instability with medial patellofemoral ligament disruption 2. Left knee patellar , medial facet, and lateral trochlea grade III and IV chondromalacia 3.  Left knee peripheral lateral meniscus tear, initial encounter, body  PROCEDURE:  Knee arthroscopy with chondroplasty of the patella and open medial patellofemoral ligament Reconstruction  Implants:  Arthrex 4.75 mm swivel lock anchors x 2 Arthrex 6 mm biocomposite interference screw x 1  Allograft hamstring graft 5.0 mm x 200 mm  SURGEON:  Nicholes Stairs, MD  Tourniquet:  57 min. At 300 mm Hg  ANESTHESIA:   General  ESTIMATED BLOOD LOSS: 10 cc  PREOPERATIVE INDICATIONS:  Wendy Duffy is a  44 y.o. female with a diagnosis of patellar dislocation and medial patellofemoral ligament disruption who failed conservative measures and elected for surgical management.  Onset was following a fall in a [employer] parking lot.  The risks benefits and alternatives were discussed with the patient preoperatively including but not limited to the risks of infection, bleeding, nerve injury, cardiopulmonary complications, the need for revision surgery, stiffness, posttraumatic arthritis, among others, and the patient was willing to proceed.  OPERATIVE IMPLANTS: #2 FiberWire x3 for the deep layer of the medial patellofemoral ligament, and 0 Vicryl x3 for the superficial fascial layer which involved the quadriceps/vastus medialis tendon.  OPERATIVE FINDINGS: There was grade 3-4 chondromalacia undersurface of the patella medial fac. The femoral trochlea was extremely shallow. The medial compartment was normal and there was no menicus tear, the lateral compartment demonstrated no chondromalacia but a free edge tear in the white zone of the mid body of  the lateral meniscus.. The anterior cruciate ligament and PCL were intact. She had substantial patellar subluxation and tilt prior to repair. Postoperatively She had appropriate tracking, despite the shallow trochlea, and She tracked centrally.  OPERATIVE PROCEDURE: The patient was brought to the operating room placed in the supine position. IV antibiotics were given. General anesthesia was administered. The lower extremity was prepped and draped in usual sterile fashion. Time out was performed. The leg was elevated exsanguinated and tourniquet was inflated. Diagnostic arthroscopy was carried out with the above-named findings. I used an arthroscopic shaver as well as an arthroscopic grasper to perform a chondroplasty of the undersurface of the patella. I switched portals to evaluate the tracking of the patella viewing from the medial portal, and also completed my chondroplasty this way.  Partial lateral meniscectomy was carried out with combination of biter and motorized shaver.  Torn mid body tear was gently debrided back to stable border.  I then removed the arthroscopic instruments, and made an incision proximal to the patella down to the proximal one third of the patella through the skin. I then elevated the fascial layer of the quadriceps investment, and mobilized this. I then made an incision through the deep capsular layer including the MPL.  I next established to point of fixation form of a ligament reconstruction.  The superior site was determined proximally 2 cm inferior to the superior pole of the patella.  This would allow for adequate bony Court order to place the 19 mm anchor.  Another site for the second anchor was established 15 mm inferior to the first anchor.  2 drill pins were placed in the predetermined locations for the anchors.  These were placed in the anterior half  of the patella.  Care was taken to avoid penetration of the cartilage surface.  After the guide pins were drilled in a  parallel fashion 15 mm apart, we then overdrilled with the cannulated reamer to a depth of 20 mm.  Next, the hamstring allograft that had been previously prepared via whipstitch of 20 mm on the inside of the graft was brought into the operative field.  Each of the 2 whipstitch ends were loaded into a 4.75 mm bio composite swivel lock anchor.  The anchors were placed in a similar fashion first by tapping the eyelet into the pilot hole.  The allograft and eyelet were both advanced into the pilot hole, and then the anchor was screwed into place.  Both the superior and inferior anchors had good purchase.  I next tested the integrity of that purchase by manually pulling on the graft and it did have excellent purchase.    We next moved to the femoral sided fixation.  We established a layer to pass our graft between the vastus medialis oblique is in the capsule of the knee.  This would ensure that our ligament was passed in an extra capsular fashion.  Once that plane was established with tonsil dissection we then used fluoroscopy to locate the femoral attachment of the medial patellofemoral ligament.  This was located by finding the sulcus between the abductor tubercle and the medial epicondyle.  On radiographic imaging, utilizing the perfect lateral of the distal femur, we found the point just distal to the posterior aspect of the medial femoral condyle, and just anterior to the continuation of the posterior femoral cortex.  Once that point was confirmed on lateral view we then drilled the guidepin for the femoral anchor.  This guidepin was drilled across the femur and exited the lateral cortex and lateral skin.  A nitinol wire was placed adjacent to that pin.  We next utilized the reamer to open the femoral socket to receive the graft and composite tenodesis screw.  Now utilizing a passing suture through the previous established layer just deep to the VMO we passed the allograft.  The allograft was then pulled into the  femoral socket utilizing the previously drilled Beath pin.  Care was taken not to over tension the patella and the knee was flexed to approximately 30.  In this position the lateral aspect of the patella was ensured to be at the lateral aspect of the trochlea.  Once that was confirmed we then advanced to the 6 mm interference screw into the procedural femoral socket.  This had excellent purchase.  The patella was then reexamined through dynamic examination and was found to not be able to dislocate as it had previously been.  Finally, we utilized the free sutures from the patellar swivel lock anchors to close the capsular layer over the medial border of the patella.  This was done in a horizontal mattress fashion and in a pants over vest manner. I then repaired the layer in involving the vastus medialis, using a pants over vest repair with 0 Vicryl. This provided excellent augmentation to the repair. I then inserted the scope through the medial portal again, and confirm that I had appropriate translation and correction of patellar tilt.    After irrigating was copiously and repaired the tissue with Vicryl, 2-0 Monocryl for the subcutaneous layer, and 3-0 Monocryl for a subcuticular running closure of the skin.  The skin with Steri-Strips and sterile gauze. She received a postoperative block. The incisions were  injected with quarter percent with epinephrine Marcaine.  Sterile dressings were applied. The tourniquet was released after 57 minutes.  She was awakened and returned to the PACU in stable and satisfactory condition. There were no complications.  Disposition:  The patient will be partial weightbearing to the operative extremity until she is cleared by physical therapy for full weightbearing once her quadriceps has returned to normal function.  The operative extremity will be locked in full extension in a hinged knee brace.  They will begin physical therapy in 1 week.  They will take twice daily aspirin  for 6 weeks for prevention of blood clots.  I will see them back in the office in 2 weeks for wound check

## 2019-05-19 NOTE — Brief Op Note (Signed)
05/19/2019  2:42 PM  PATIENT:  Wendy Duffy  44 y.o. female  PRE-OPERATIVE DIAGNOSIS:  Left patellar instability  POST-OPERATIVE DIAGNOSIS:  Left patellar instability  PROCEDURE:  Procedure(s) with comments: KNEE ARTHROSCOPY WITH CHONDROPLASTY, partial lateral meniscectomy, MEDIAL PATELLAR FEMORAL LIGAMENT RECONSTRUCTION (Left) - 2 HRS  SURGEON:  Surgeon(s) and Role:    * Stann Mainland, Elly Modena, MD - Primary  PHYSICIAN ASSISTANT:   ASSISTANTS: none   ANESTHESIA:   local and general  EBL:  10 cc  BLOOD ADMINISTERED:none  DRAINS: none   LOCAL MEDICATIONS USED:  MARCAINE     SPECIMEN:  No Specimen  DISPOSITION OF SPECIMEN:  N/A  COUNTS:  YES  TOURNIQUET:   Total Tourniquet Time Documented: Thigh (Left) - 57 minutes Total: Thigh (Left) - 57 minutes   DICTATION: .Note written in EPIC  PLAN OF CARE: Discharge to home after PACU  PATIENT DISPOSITION:  PACU - hemodynamically stable.   Delay start of Pharmacological VTE agent (>24hrs) due to surgical blood loss or risk of bleeding: not applicable

## 2019-05-21 ENCOUNTER — Telehealth: Payer: Self-pay | Admitting: Family Medicine

## 2019-05-21 NOTE — Telephone Encounter (Signed)
Family medicine telephone note  Received call from patient about excruciating knee pain.  Upon chart review per patient report she was seen on 3/5 for a left knee arthroscopy with chondroplasty, medial patellofemoral ligament reconstruction.  She progressively developed excruciating pain throughout the night 3/6 which she attributes to a long-acting local anesthetic they gave her intraoperatively, wearing off.  She was prescribed oxycodone and methocarbamol for the pain control.  She has been taking the 5 mg oxycodone every 6 hours as prescribed, her last dose was around midnight.  She does not feel like the methocarbamol has been doing very much for her.  Reviewed her chart and she is otherwise healthy aside from the knee problems.  To get adequate analgesia I recommended increasing her dose to 10 mg oxycodone for a one-time dosing which would be okay to take now.  Recommended that if she is unable to get adequate analgesia from that the emergency department is always an option.  Lastly I recommended to try and get in touch with [facility] to their on-call provider line later on in the morning when that service becomes available to see if this level of pain is expected as well as to see if something stronger would need to be called in for her.  Patient voiced understanding and is in agreement with the plan.  Appreciative of phone guidance.  Guadalupe Dawn MD PGY-3 Family Medicine Resident

## 2019-05-24 ENCOUNTER — Ambulatory Visit: Payer: Self-pay

## 2019-05-24 NOTE — Anesthesia Postprocedure Evaluation (Signed)
Anesthesia Post Note  Patient: Wendy Duffy  Procedure(s) Performed: KNEE ARTHROSCOPY WITH CHONDROPLASTY, MEDIAL PATELLAR FEMORAL LIGAMENT RECONSTRUCTION (Left Knee)     Patient location during evaluation: PACU Anesthesia Type: General Level of consciousness: awake and sedated Pain management: pain level controlled Vital Signs Assessment: post-procedure vital signs reviewed and stable Respiratory status: spontaneous breathing Cardiovascular status: stable Postop Assessment: no apparent nausea or vomiting Anesthetic complications: no    Last Vitals:  Vitals:   05/19/19 1600 05/19/19 1700  BP: 130/90 121/88  Pulse: (!) 108 93  Resp: 13 16  Temp:  36.7 C  SpO2: 93% 99%    Last Pain:  Vitals:   05/22/19 0958  TempSrc:   PainSc: 9    Pain Goal:                   Huston Foley

## 2019-06-03 ENCOUNTER — Encounter: Payer: Self-pay | Admitting: Family Medicine

## 2019-06-13 ENCOUNTER — Other Ambulatory Visit: Payer: Self-pay | Admitting: Family Medicine

## 2019-06-20 ENCOUNTER — Ambulatory Visit: Payer: Self-pay

## 2019-06-22 ENCOUNTER — Other Ambulatory Visit: Payer: Self-pay

## 2019-06-22 ENCOUNTER — Ambulatory Visit
Admission: RE | Admit: 2019-06-22 | Discharge: 2019-06-22 | Disposition: A | Discharge status: Home / Self Care | Payer: 59 | Source: Ambulatory Visit

## 2019-06-22 DIAGNOSIS — Z1231 Encounter for screening mammogram for malignant neoplasm of breast: Secondary | ICD-10-CM

## 2019-07-06 ENCOUNTER — Telehealth (HOSPITAL_COMMUNITY): Payer: Self-pay | Admitting: Psychiatry

## 2019-07-06 ENCOUNTER — Other Ambulatory Visit: Payer: Self-pay

## 2019-07-06 ENCOUNTER — Telehealth (HOSPITAL_COMMUNITY): Payer: 59 | Admitting: Psychiatry

## 2019-07-06 NOTE — Telephone Encounter (Signed)
I called the patient during our scheduled appointment time and there was no answer. I left a voice message asking the patient to call the clinic. I was unable to speak with the patient today.

## 2019-09-09 ENCOUNTER — Other Ambulatory Visit: Payer: Self-pay | Admitting: Family Medicine

## 2019-09-11 ENCOUNTER — Telehealth (HOSPITAL_COMMUNITY): Payer: Self-pay

## 2019-09-11 MED ORDER — LOSARTAN POTASSIUM 100 MG PO TABS: 100.0000 mg | ORAL_TABLET | Freq: Every day | ORAL | 0 refills | Status: DC

## 2019-09-11 NOTE — Telephone Encounter (Signed)
Patient called requesting a refill on her Bupropion 300mg  24 hr tablet and her Venlafaxine XR 75mg  to be sent to [facility] on [address] in [place]. Followup scheduled on 09/21/19. Thank you.

## 2019-09-11 NOTE — Telephone Encounter (Signed)
Patient called requesting a refill on her Bupropion 300mg  24hr tablet and her Venlafaxine XR 75mg  24 hr capsule to be sent to [facility] on [address] in [place]. Patient's out of both medications. Followup scheduled for 09/21/19. Thank you.

## 2019-09-21 ENCOUNTER — Encounter (HOSPITAL_COMMUNITY): Payer: Self-pay | Admitting: Psychiatry

## 2019-09-21 ENCOUNTER — Telehealth (INDEPENDENT_AMBULATORY_CARE_PROVIDER_SITE_OTHER): Payer: 59 | Admitting: Psychiatry

## 2019-09-21 ENCOUNTER — Other Ambulatory Visit: Payer: Self-pay

## 2019-09-21 DIAGNOSIS — F422 Mixed obsessional thoughts and acts: Secondary | ICD-10-CM

## 2019-09-21 DIAGNOSIS — F331 Major depressive disorder, recurrent, moderate: Secondary | ICD-10-CM | POA: Diagnosis not present

## 2019-09-21 DIAGNOSIS — F411 Generalized anxiety disorder: Secondary | ICD-10-CM | POA: Diagnosis not present

## 2019-09-21 MED ORDER — BUPROPION HCL ER (XL) 300 MG PO TB24: 300.0000 mg | ORAL_TABLET | Freq: Every day | ORAL | 2 refills | Status: DC

## 2019-09-21 MED ORDER — VENLAFAXINE HCL ER 75 MG PO CP24: 225.0000 mg | ORAL_CAPSULE | Freq: Every day | ORAL | 2 refills | Status: DC

## 2019-09-21 MED ORDER — QUETIAPINE FUMARATE 300 MG PO TABS: ORAL_TABLET | ORAL | 2 refills | Status: DC

## 2019-09-21 NOTE — Progress Notes (Signed)
Virtual Visit via Telephone Note  I connected with Wendy Duffy on 09/21/19 at  2:15 PM EDT by telephone and verified that I am speaking with the correct person using two identifiers.  Location: Patient: home Provider: office   I discussed the limitations, risks, security and privacy concerns of performing an evaluation and management service by telephone and the availability of in person appointments. I also discussed with the patient that there may be a patient responsible charge related to this service. The patient expressed understanding and agreed to proceed.   History of Present Illness: Wendy Duffy is doing well. She has fully recovered from her injury. She is interviewing for different jobs and it is looking good. Wendy Duffy's depression has improved since her health has improved. Over the last 2 weeks her depression and anxiety have been mild. She feels stable and calm. She denies isolation, anhedonia and hopelessness. Wendy Duffy denies SI/HI. Her sleep is ok with Seroquel.    Observations/Objective:  General Appearance: unable to assess  Eye Contact:  unable to assess  Speech:  Clear and Coherent and Normal Rate  Volume:  Normal  Mood:  Euthymic  Affect:  Full Range  Thought Process:  Goal Directed, Linear and Descriptions of Associations: Intact  Orientation:  Full (Time, Place, and Person)  Thought Content:  Logical  Suicidal Thoughts:  No  Homicidal Thoughts:  No  Memory:  Immediate;   Good  Judgement:  Good  Insight:  Good  Psychomotor Activity: unable to assess  Concentration:  Concentration: Good  Recall:  Good  Fund of Knowledge:  Good  Language:  Good  Akathisia:  unable to assess  Handed:  Right  AIMS (if indicated):     Assets:  Communication Skills Desire for Improvement Financial Resources/Insurance Housing Intimacy Resilience Social Support Talents/Skills Transportation Vocational/Educational  ADL's:  unable to assess  Cognition:  WNL  Sleep:          Assessment and Plan:  MDD- recurrent, moderate; GAD; OCD  Effexor XR 225mg  po qD  Seroquel 150mg  po qHS  Wellbutrin XL 150mg  po qD  Reviewed EKG 05/15/19 and 05/11/19 QTc 532; 01/21/2016 Qtc 467. I will continue to monitor   Follow Up Instructions: In 3 months or sooner if needed   I discussed the assessment and treatment plan with the patient. The patient was provided an opportunity to ask questions and all were answered. The patient agreed with the plan and demonstrated an understanding of the instructions.   The patient was advised to call back or seek an in-person evaluation if the symptoms worsen or if the condition fails to improve as anticipated.  I provided 15 minutes of non-face-to-face time during this encounter.   [name], MD

## 2019-09-26 ENCOUNTER — Telehealth: Payer: Self-pay | Admitting: Family Medicine

## 2019-09-26 NOTE — Telephone Encounter (Signed)
Patient is dropping off physical form to be completed by the doctor for her work. Patient would like to be contacted when it is ready for pick up. [phone]. Last DOS- 02/23/2019. Thanks

## 2019-09-26 NOTE — Telephone Encounter (Signed)
Clinical info completed on Physical form.  Place form in PCP's box for completion.  Salvatore Marvel, CMA

## 2019-10-02 NOTE — Telephone Encounter (Signed)
Patient informed of form ready for pick up up front.

## 2019-10-17 ENCOUNTER — Other Ambulatory Visit: Payer: Self-pay | Admitting: Family Medicine

## 2019-10-19 ENCOUNTER — Other Ambulatory Visit (HOSPITAL_COMMUNITY): Payer: Self-pay | Admitting: Psychiatry

## 2019-10-19 DIAGNOSIS — F411 Generalized anxiety disorder: Secondary | ICD-10-CM

## 2019-10-19 DIAGNOSIS — F331 Major depressive disorder, recurrent, moderate: Secondary | ICD-10-CM

## 2019-12-21 ENCOUNTER — Other Ambulatory Visit: Payer: Self-pay

## 2019-12-21 ENCOUNTER — Telehealth (HOSPITAL_COMMUNITY): Payer: 59 | Admitting: Psychiatry

## 2019-12-24 ENCOUNTER — Other Ambulatory Visit: Payer: Self-pay | Admitting: Family Medicine

## 2020-01-01 ENCOUNTER — Ambulatory Visit (INDEPENDENT_AMBULATORY_CARE_PROVIDER_SITE_OTHER): Payer: 59 | Admitting: Family Medicine

## 2020-01-01 VITALS — BP 126/80 | HR 95

## 2020-01-01 DIAGNOSIS — J069 Acute upper respiratory infection, unspecified: Secondary | ICD-10-CM | POA: Diagnosis not present

## 2020-01-01 NOTE — Patient Instructions (Addendum)
It was a pleasure to see you today!  Thank you for choosing [facility] for your primary care.  Wendy Duffy was seen for congestion and cough. I feel this is from a virus.    Our plans for today were:  Take Mucinex as needed to help with the chest congestion  You may take Coicidine , Dayquel/Niquel as needed   Can try honey and cough drops for cough  You may try chloraseptic spray for sore throat  If you feel like allergies are affecting your symptoms, you can trial Zyrtec or Claritin.  I would recommend getting repeat COVID testing to confirm your negative results.  If desired you may go to Urgent care for Flu and viral testing   Best Wishes,   Mina Marble, DO

## 2020-01-02 NOTE — Progress Notes (Signed)
   Subjective:   Patient ID: Wendy Duffy    DOB: 02-26-1976, 44 y.o. female   MRN: [id number]  Wendy Duffy is a 44 y.o. female with a history of hypertension, migraine, GERD, arthritis, anemia, anxiety, back pain, depression, CAD, MDD, obesity, here for chest congestion and cough.  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  She endorses 1 week history of chest congestion and cough, starting around 10/10.  She notes she initially had muscle aches, fatigue, headache, sore throat.  These have improved but now she has persistent nasal and chest congestion.  She notes that she is only able to cough up a small amount of phlegm that is clear.  Denies any fevers, chills.  She is eating and drinking normally.  She has been Covid vaccinated x2 in April.  She has not been Flu vaccinated.  She had 2 Covid test completed on 10/13 (rapid and long-acting) which were both negative.  She does note that one of the children now has a fever who she often is around.  She has been treating her symptoms with Coricidin H BP with only some improvement.  Review of Systems:  Per HPI.   Objective:   BP 126/80   Pulse 95   LMP 09/20/2015 (Exact Date)   SpO2 99%  Vitals and nursing note reviewed.  General: pleasant older female, sitting comfortably in exam chair, well nourished, well developed, in no acute distress with non-toxic appearance HEENT: normocephalic, atraumatic, moist mucous membranes, oropharynx with erythema and cobblestoning, tonsils normal, TM normal bilaterally  CV: regular rate and rhythm without murmurs, rubs, or gallops Lungs: clear to auscultation bilaterally with normal work of breathing on room air Resp: breathing comfortably on room air, speaking in full sentences Skin: warm, dry Extremities: warm and well perfused, normal tone MSK: gait normal Neuro: Alert and oriented, speech normal  Assessment & Plan:   Viral URI with cough Symptoms appear most consistent  with viral URI with cough. She is very well appearing on exam, afebrile with no red flag symptoms. Breathing comfortably on room air with normal vitals. Patient is eating, drinking, voiding, and stooling normally which is reassuring. No underlying respiratory disease. COVID test negative x2 however may have been too early in symptom onset. Recommended repeat testing to confirm. Flu virus is not endemic to community with minimal cases per [facility] website, thus less likely cause for symptoms. Patient is outside window for antiviral anyways.  At this time, recommend conservative and symptomatic management. Do not feel imaging or antibiotics are indicated at this time. - She may try mucinex for decongestion and cough/cold medicine PRN.  - Recommended nasal saline and humidifier, cough lozenges, honey to help with symptoms. -  Tylenol/Ibuprofen PRN for discomfort.  - Encouraged fluids to maintain adequate hydration - Strict return precautions given. Patient encouraged to call office or go to ED if difficulty breathing occurs. She should return if develops signs of improvement then worsening symptoms or lack of improvement within 1-2 weeks. She voiced understanding and agreement with plan - encouraged repeat COVID testing to confirm  - If patient desires flu testing she will have to report to urgent care. She voiced understanding - consider imaging if no improvement/worsening   Mina Marble, DO PGY-3, [facility] 01/02/2020 3:00 PM

## 2020-01-02 NOTE — Assessment & Plan Note (Addendum)
Symptoms appear most consistent with viral URI with cough. She is very well appearing on exam, afebrile with no red flag symptoms. Breathing comfortably on room air with normal vitals. Patient is eating, drinking, voiding, and stooling normally which is reassuring. No underlying respiratory disease. COVID test negative x2 however may have been too early in symptom onset. Recommended repeat testing to confirm. Flu virus is not endemic to community with minimal cases per [facility] website, thus less likely cause for symptoms. Patient is outside window for antiviral anyways.  At this time, recommend conservative and symptomatic management. Do not feel imaging or antibiotics are indicated at this time. - She may try mucinex for decongestion and cough/cold medicine PRN.  - Recommended nasal saline and humidifier, cough lozenges, honey to help with symptoms. -  Tylenol/Ibuprofen PRN for discomfort.  - Encouraged fluids to maintain adequate hydration - Strict return precautions given. Patient encouraged to call office or go to ED if difficulty breathing occurs. She should return if develops signs of improvement then worsening symptoms or lack of improvement within 1-2 weeks. She voiced understanding and agreement with plan - encouraged repeat COVID testing to confirm  - If patient desires flu testing she will have to report to urgent care. She voiced understanding - consider imaging if no improvement/worsening

## 2020-01-19 ENCOUNTER — Other Ambulatory Visit (HOSPITAL_COMMUNITY): Payer: Self-pay | Admitting: Psychiatry

## 2020-01-19 DIAGNOSIS — F411 Generalized anxiety disorder: Secondary | ICD-10-CM

## 2020-01-19 DIAGNOSIS — F331 Major depressive disorder, recurrent, moderate: Secondary | ICD-10-CM

## 2020-01-25 ENCOUNTER — Other Ambulatory Visit: Payer: Self-pay

## 2020-01-25 ENCOUNTER — Telehealth (HOSPITAL_COMMUNITY): Payer: 59 | Admitting: Psychiatry

## 2020-01-25 DIAGNOSIS — F411 Generalized anxiety disorder: Secondary | ICD-10-CM

## 2020-01-25 DIAGNOSIS — F422 Mixed obsessional thoughts and acts: Secondary | ICD-10-CM

## 2020-01-25 DIAGNOSIS — F331 Major depressive disorder, recurrent, moderate: Secondary | ICD-10-CM

## 2020-01-25 MED ORDER — VENLAFAXINE HCL ER 75 MG PO CP24: 225.0000 mg | ORAL_CAPSULE | Freq: Every day | ORAL | 2 refills | Status: DC

## 2020-01-25 MED ORDER — QUETIAPINE FUMARATE 300 MG PO TABS: ORAL_TABLET | ORAL | 2 refills | Status: DC

## 2020-01-25 MED ORDER — BUPROPION HCL ER (XL) 300 MG PO TB24: 300.0000 mg | ORAL_TABLET | Freq: Every day | ORAL | 2 refills | Status: DC

## 2020-01-25 NOTE — Progress Notes (Unsigned)
Virtual Visit via Telephone Note  I connected with Wendy Duffy on 01/25/20 at  9:30 AM EST by telephone and verified that I am speaking with the correct person using two identifiers.  Location: Patient: work Provider: office   I discussed the limitations, risks, security and privacy concerns of performing an evaluation and management service by telephone and the availability of in person appointments. I also discussed with the patient that there may be a patient responsible charge related to this service. The patient expressed understanding and agreed to proceed.   History of Present Illness: Wendy Duffy sometimes feels emotionally sensitive. She gets upset with certain stressors with her husband or co-workers. Wendy Duffy is usually able to reflect on it after a little while and rationalize it. She is able to calm. It seems to start happening more frequently about 6 weeks. Wendy Duffy is financially strapped due to having a court case going on. They are at the settlement part and it is extremely stressful and anxiety provoking. So much is up in the air and she does not know what is going to happen or when the case is going to resolve. Wendy Duffy finds she is losing her focus more often. It could be age.  Wendy Duffy denies any overwhelming depression. Mood has been stable. Sleep is good. She is getting about 8 hrs/night with Seroquel.   She denies SI/HI.  OCD is well controlled and she is not engaging in any ritualistic behaviors.   Observations/Objective:  General Appearance: unable to assess  Eye Contact:  unable to assess  Speech:  {Speech:22685}  Volume:  {Volume (PAA):22686}  Mood:  {BHH MOOD:22306}  Affect:  {Affect (PAA):22687}  Thought Process:  {Thought Process (PAA):22688}  Orientation:  {BHH ORIENTATION (PAA):22689}  Thought Content:  {Thought Content:22690}  Suicidal Thoughts:  {ST/HT (PAA):22692}  Homicidal Thoughts:  {ST/HT (PAA):22692}  Memory:  {BHH MEMORY:22881}  Judgement:  {Judgement  (PAA):22694}  Insight:  {Insight (PAA):22695}  Psychomotor Activity: unable to assess  Concentration:  {Concentration:21399}  Recall:  {BHH GOOD/FAIR/POOR:22877}  Fund of Knowledge:  {BHH GOOD/FAIR/POOR:22877}  Language:  {BHH GOOD/FAIR/POOR:22877}  Akathisia:  unable to assess  Handed:  {Handed:22697}  AIMS (if indicated):     Assets:  {Assets (PAA):22698}  ADL's:  unable to assess  Cognition:  {chl bhh cognition:304700322}  Sleep:         Assessment and Plan: MDD- recurrent, moderate; GAD; OCD  Current status of symptoms: worsening anxiety due to stressors  Effexor XR 225mg  po qD  Seroquel 150mg  po qD  Wellbutrin XL 300mg  po qD  Discussed using grounding techniques and mindfulness to deal with anxiety  Follow Up Instructions: In 3 months or sooner if needed   I discussed the assessment and treatment plan with the patient. The patient was provided an opportunity to ask questions and all were answered. The patient agreed with the plan and demonstrated an understanding of the instructions.   The patient was advised to call back or seek an in-person evaluation if the symptoms worsen or if the condition fails to improve as anticipated.  I provided 17 minutes of non-face-to-face time during this encounter.   [name], MD

## 2020-03-29 ENCOUNTER — Encounter: Payer: Self-pay | Admitting: Family Medicine

## 2020-03-29 ENCOUNTER — Other Ambulatory Visit (HOSPITAL_COMMUNITY): Payer: Self-pay | Admitting: Psychiatry

## 2020-03-29 DIAGNOSIS — F331 Major depressive disorder, recurrent, moderate: Secondary | ICD-10-CM

## 2020-03-29 DIAGNOSIS — F411 Generalized anxiety disorder: Secondary | ICD-10-CM

## 2020-04-02 ENCOUNTER — Telehealth (HOSPITAL_COMMUNITY): Payer: Self-pay

## 2020-04-02 DIAGNOSIS — F331 Major depressive disorder, recurrent, moderate: Secondary | ICD-10-CM

## 2020-04-02 DIAGNOSIS — F411 Generalized anxiety disorder: Secondary | ICD-10-CM

## 2020-04-02 MED ORDER — QUETIAPINE FUMARATE 300 MG PO TABS: ORAL_TABLET | ORAL | 0 refills | Status: DC

## 2020-04-02 NOTE — Telephone Encounter (Signed)
Medication management - Telephone call with pt requesting a new Seroquel order as the [facility] on [address] is having problems filling. Pt. requested a one time order be sent into the [facility] on [address] due to inclement weather and that this [facility] was closer to her home.  Called the [facility] on [address] and spoke with Loma Sousa, pharmacist who verified patient should have 1 refill left but their computer system was having problems transferring.  Spoke to [place], Software engineer at the [facility] on [address] to verify they had Seroquel 300 mg tablets in and then to Dr. Adele Schilder who provided a verbal order to send in a one time order under him for 300mg , 1/2 tablet at bedtime, #15 with no refills to the [facility] on [address].  Order sent as prescribed and called patient back to inform this order was sent and for patient to call back if any problems filling.  Reminded patient of next appointment with Dr. Doyne Keel set for 04/18/20.

## 2020-04-18 ENCOUNTER — Other Ambulatory Visit: Payer: Self-pay

## 2020-04-18 ENCOUNTER — Telehealth (INDEPENDENT_AMBULATORY_CARE_PROVIDER_SITE_OTHER): Payer: Self-pay | Admitting: Psychiatry

## 2020-04-18 DIAGNOSIS — F331 Major depressive disorder, recurrent, moderate: Secondary | ICD-10-CM

## 2020-04-18 DIAGNOSIS — F5105 Insomnia due to other mental disorder: Secondary | ICD-10-CM

## 2020-04-18 DIAGNOSIS — F422 Mixed obsessional thoughts and acts: Secondary | ICD-10-CM

## 2020-04-18 DIAGNOSIS — F411 Generalized anxiety disorder: Secondary | ICD-10-CM

## 2020-04-18 DIAGNOSIS — F99 Mental disorder, not otherwise specified: Secondary | ICD-10-CM

## 2020-04-18 MED ORDER — VENLAFAXINE HCL ER 75 MG PO CP24: 225.0000 mg | ORAL_CAPSULE | Freq: Every day | ORAL | 0 refills | Status: DC

## 2020-04-18 MED ORDER — BUPROPION HCL ER (XL) 300 MG PO TB24: 300.0000 mg | ORAL_TABLET | Freq: Every day | ORAL | 0 refills | Status: DC

## 2020-04-18 MED ORDER — QUETIAPINE FUMARATE 300 MG PO TABS: ORAL_TABLET | ORAL | 1 refills | Status: DC

## 2020-04-18 NOTE — Progress Notes (Signed)
Virtual Visit via Telephone Note  I connected with Wendy Duffy on 04/18/20 at  2:00 PM EST by telephone and verified that I am speaking with the correct person using two identifiers.  Location: Patient: home Provider: office   I discussed the limitations, risks, security and privacy concerns of performing an evaluation and management service by telephone and the availability of in person appointments. I also discussed with the patient that there may be a patient responsible charge related to this service. The patient expressed understanding and agreed to proceed.   History of Present Illness: Wendy Duffy reports she is sick- body aches, fever, chills and congestion. She is waiting on the results of the COVID PCR and has an appointment with her PCP tomorrow. "I am happy most of the time". She does note that at times she can be very emotional when watching tv. Her depression and anxiety are mostly situational. Wendy Duffy denies anhedonia and isolation. Work and online classes are going well. She is able to finish her assignments and is motivated. Her sleep is good. She denies SI/HI.    Observations/Objective:  General Appearance: unable to assess  Eye Contact:  unable to assess  Speech:  Clear and Coherent and Normal Rate  Volume:  Normal  Mood:  Euthymic  Affect:  Restricted  Thought Process:  Goal Directed, Linear and Descriptions of Associations: Intact  Orientation:  Full (Time, Place, and Person)  Thought Content:  Logical  Suicidal Thoughts:  No  Homicidal Thoughts:  No  Memory:  Immediate;   Good  Judgement:  Good  Insight:  Good  Psychomotor Activity: unable to assess  Concentration:  Concentration: Good  Recall:  Good  Fund of Knowledge:  Good  Language:  Good  Akathisia:  unable to assess  Handed:  Right  AIMS (if indicated):     Assets:  Communication Skills Desire for Improvement Financial Resources/Insurance Housing Resilience Social  Support Talents/Skills Transportation Vocational/Educational  ADL's:  unable to assess  Cognition:  WNL  Sleep:         Assessment and Plan: 1. MDD (major depressive disorder), recurrent episode, moderate (HCC) - buPROPion (WELLBUTRIN XL) 300 MG 24 hr tablet; Take 1 tablet (300 mg total) by mouth daily.  Dispense: 90 tablet; Refill: 0 - venlafaxine XR (EFFEXOR-XR) 75 MG 24 hr capsule; Take 3 capsules (225 mg total) by mouth daily.  Dispense: 270 capsule; Refill: 0 - QUEtiapine (SEROQUEL) 300 MG tablet; TAKE 1/2 TABLET BY MOUTH EVERY NIGHT AT BEDTIME  Dispense: 45 tablet; Refill: 1  2. GAD (generalized anxiety disorder) - venlafaxine XR (EFFEXOR-XR) 75 MG 24 hr capsule; Take 3 capsules (225 mg total) by mouth daily.  Dispense: 270 capsule; Refill: 0 - QUEtiapine (SEROQUEL) 300 MG tablet; TAKE 1/2 TABLET BY MOUTH EVERY NIGHT AT BEDTIME  Dispense: 45 tablet; Refill: 1  3. Mixed obsessional thoughts and acts - venlafaxine XR (EFFEXOR-XR) 75 MG 24 hr capsule; Take 3 capsules (225 mg total) by mouth daily.  Dispense: 270 capsule; Refill: 0 - QUEtiapine (SEROQUEL) 300 MG tablet; TAKE 1/2 TABLET BY MOUTH EVERY NIGHT AT BEDTIME  Dispense: 45 tablet; Refill: 1  4. Insomnia due to other mental disorder - QUEtiapine (SEROQUEL) 300 MG tablet; TAKE 1/2 TABLET BY MOUTH EVERY NIGHT AT BEDTIME  Dispense: 45 tablet; Refill: 1    Follow Up Instructions: In 3 months or sooner if needed   I discussed the assessment and treatment plan with the patient. The patient was provided an opportunity to ask questions  and all were answered. The patient agreed with the plan and demonstrated an understanding of the instructions.   The patient was advised to call back or seek an in-person evaluation if the symptoms worsen or if the condition fails to improve as anticipated.  I provided 11 minutes of non-face-to-face time during this encounter.   [name], MD

## 2020-04-19 ENCOUNTER — Telehealth (INDEPENDENT_AMBULATORY_CARE_PROVIDER_SITE_OTHER): Payer: Self-pay | Admitting: Family Medicine

## 2020-04-19 ENCOUNTER — Encounter: Payer: Self-pay | Admitting: Family Medicine

## 2020-04-19 DIAGNOSIS — R0981 Nasal congestion: Secondary | ICD-10-CM | POA: Insufficient documentation

## 2020-04-19 NOTE — Progress Notes (Signed)
[facility] Telemedicine Visit  Patient consented to have virtual visit and was identified by name and date of birth. Method of visit: Telephone  Encounter participants: Patient: Wendy Duffy - located at home Provider: Gerlene Fee - located at home office Others (if applicable): N/A  Chief Complaint: Nasal congestion, headache  HPI:  Wendy Duffy is 45 yo F who has been experiencing symptoms of fever, body aches, congestion, and sore throat since Monday. Husband had known work exposure to [employer] but has since tested negative with PCR. Patient PCR test is pending but rapid test was negative. Desires return to work note when PCR available. Most symptoms have resolved and continues with lingering fever, sore throat, nasal congestion and headache. She has tried peppermint oil without relief. Endorses she continues to have normal smell and taste.   Of note, she is full vaccinated and boosted against COVID-19. Has not received her flu vaccine this year.   ROS: per HPI  Pertinent PMHx: HTN  Exam:  LMP 09/20/2015 (Exact Date)   Respiratory: Speaking in full sentences. Audible nasal congestion.   Assessment/Plan:  Nasal congestion Acute. 5 days of URI symptoms. Awaiting PCR COVID results for return to work note. Continues to have cyclic fever (Tmax 678.9) sore throat, nasal congestion and headache. Suspected headache is precipitated by ongoing nasal congestion. Discussed supportive measures such as flonase (on patient's medication list) as well honey for sore throat. Can use tylenol/ibuprofen for fever and headache. Discussed that if patient has flu she is out of the window for tamiflu treatment and virus is likely self limiting. Suggested calling office with negative COVID PCR available and patient is feeling well/afebrile for return to work note.    Time spent during visit with patient: 15 minutes

## 2020-04-19 NOTE — Assessment & Plan Note (Addendum)
Acute. 5 days of URI symptoms. Awaiting PCR COVID results for return to work note. Continues to have cyclic fever (Tmax 235.3) sore throat, nasal congestion and headache. Suspected headache is precipitated by ongoing nasal congestion. Discussed supportive measures such as flonase (on patient's medication list) as well honey for sore throat. Can use tylenol/ibuprofen for fever and headache. Discussed that if patient has flu she is out of the window for tamiflu treatment and virus is likely self limiting. Suggested calling office with negative COVID PCR available and patient is feeling well/afebrile for return to work note.

## 2020-04-22 ENCOUNTER — Other Ambulatory Visit: Payer: Self-pay | Admitting: Family Medicine

## 2020-05-28 ENCOUNTER — Other Ambulatory Visit: Payer: Self-pay | Admitting: Family Medicine

## 2020-07-11 ENCOUNTER — Other Ambulatory Visit: Payer: Self-pay

## 2020-07-11 ENCOUNTER — Telehealth (INDEPENDENT_AMBULATORY_CARE_PROVIDER_SITE_OTHER): Payer: BC Managed Care – PPO | Admitting: Psychiatry

## 2020-07-11 DIAGNOSIS — F411 Generalized anxiety disorder: Secondary | ICD-10-CM | POA: Diagnosis not present

## 2020-07-11 DIAGNOSIS — F422 Mixed obsessional thoughts and acts: Secondary | ICD-10-CM | POA: Diagnosis not present

## 2020-07-11 DIAGNOSIS — F331 Major depressive disorder, recurrent, moderate: Secondary | ICD-10-CM | POA: Diagnosis not present

## 2020-07-11 DIAGNOSIS — F5105 Insomnia due to other mental disorder: Secondary | ICD-10-CM | POA: Diagnosis not present

## 2020-07-11 DIAGNOSIS — F99 Mental disorder, not otherwise specified: Secondary | ICD-10-CM

## 2020-07-11 MED ORDER — VENLAFAXINE HCL ER 75 MG PO CP24: 225.0000 mg | ORAL_CAPSULE | Freq: Every day | ORAL | 0 refills | Status: DC

## 2020-07-11 MED ORDER — BUPROPION HCL ER (XL) 300 MG PO TB24: 300.0000 mg | ORAL_TABLET | Freq: Every day | ORAL | 0 refills | Status: DC

## 2020-07-11 MED ORDER — QUETIAPINE FUMARATE 200 MG PO TABS: 200.0000 mg | ORAL_TABLET | Freq: Every day | ORAL | 0 refills | Status: DC

## 2020-07-11 NOTE — Progress Notes (Signed)
Virtual Visit via Telephone Note  I connected with Wendy Duffy on 07/11/20 at 10:00 AM EDT by telephone and verified that I am speaking with the correct person using two identifiers. We attempted to connect thru a video chat but Enrica was having difficulty so we opted to continue by phone.   Location: Patient: home Provider: office   I discussed the limitations, risks, security and privacy concerns of performing an evaluation and management service by telephone and the availability of in person appointments. I also discussed with the patient that there may be a patient responsible charge related to this service. The patient expressed understanding and agreed to proceed.   History of Present Illness: Wendy Duffy states she is doing ok. Lately she has been having work and marital stress. Wendy Duffy is crying a lot and has been feeling bad about herself.  She is depressed and is endorsing anhedonia and isolation. Her motivation is generally low.  She is having a lot of negative thoughts. Wendy Duffy is hopeful that things will improve when school ends in 6 weeks. Her sleep is ok. She is able to fall asleep but she wakes for 30 min in the middle of the night. She is able to fall back asleep quickly.  Her appetite is poor. 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 She is graduating from college this summer. Her anxiety is mild and manageable. Her mood is what has really been affected. [name] has not been checking her BP very frequently.    Observations/Objective:  General Appearance: unable to assess  Eye Contact:  unable to assess  Speech:  Clear and Coherent and Normal Rate  Volume:  Normal  Mood:  Anxious and Depressed  Affect:  Full Range  Thought Process:  Goal Directed, Linear and Descriptions of Associations: Intact  Orientation:  Full (Time, Place, and Person)  Thought Content:  Logical  Suicidal Thoughts:  No  Homicidal Thoughts:  No  Memory:  Immediate;   Good  Judgement:  Good   Insight:  Good  Psychomotor Activity: unable to assess  Concentration:  Concentration: Good  Recall:  Good  Fund of Knowledge:  Good  Language:  Good  Akathisia:  unable to assess  Handed:  Right  AIMS (if indicated):     Assets:  Communication Skills Desire for Improvement Financial Resources/Insurance Housing Resilience Social Support Talents/Skills Transportation Vocational/Educational  ADL's:  unable to assess  Cognition:  WNL  Sleep:         Assessment and Plan: Depression screen [facility] 2/9 07/11/2020 02/23/2019 07/09/2017 03/24/2017 11/12/2016  Decreased Interest 1 2 0 0 0  Down, Depressed, Hopeless 2 1 0 0 0  PHQ - 2 Score 3 3 0 0 0  Altered sleeping 1 - - - -  Tired, decreased energy 0 - - - -  Change in appetite 3 - - - -  Feeling bad or failure about yourself  3 - - - -  Trouble concentrating 0 - - - -  Moving slowly or fidgety/restless 0 - - - -  Suicidal thoughts 0 - - - -  PHQ-9 Score 10 - - - -  Difficult doing work/chores Somewhat difficult - - - -  Some recent data might be hidden   Flowsheet Row Video Visit from 07/11/2020 in [facility]) from 07/15/2018 in [facility]  C-SSRS RISK CATEGORY No Risk Moderate Risk     - increase Seroquel 200mg  po  qHS  1. MDD (major depressive disorder), recurrent episode, moderate (HCC) - venlafaxine XR (EFFEXOR-XR) 75 MG 24 hr capsule; Take 3 capsules (225 mg total) by mouth daily.  Dispense: 270 capsule; Refill: 0 - buPROPion (WELLBUTRIN XL) 300 MG 24 hr tablet; Take 1 tablet (300 mg total) by mouth daily.  Dispense: 90 tablet; Refill: 0 - QUEtiapine (SEROQUEL) 200 MG tablet; Take 1 tablet (200 mg total) by mouth at bedtime.  Dispense: 90 tablet; Refill: 0  2. GAD (generalized anxiety disorder) - venlafaxine XR (EFFEXOR-XR) 75 MG 24 hr capsule; Take 3 capsules (225 mg total) by mouth daily.  Dispense: 270 capsule; Refill: 0 -  QUEtiapine (SEROQUEL) 200 MG tablet; Take 1 tablet (200 mg total) by mouth at bedtime.  Dispense: 90 tablet; Refill: 0  3. Mixed obsessional thoughts and acts - venlafaxine XR (EFFEXOR-XR) 75 MG 24 hr capsule; Take 3 capsules (225 mg total) by mouth daily.  Dispense: 270 capsule; Refill: 0 - QUEtiapine (SEROQUEL) 200 MG tablet; Take 1 tablet (200 mg total) by mouth at bedtime.  Dispense: 90 tablet; Refill: 0  4. Insomnia due to other mental disorder - QUEtiapine (SEROQUEL) 200 MG tablet; Take 1 tablet (200 mg total) by mouth at bedtime.  Dispense: 90 tablet; Refill: 0    Follow Up Instructions: In 2-3 months or sooner if needed   I discussed the assessment and treatment plan with the patient. The patient was provided an opportunity to ask questions and all were answered. The patient agreed with the plan and demonstrated an understanding of the instructions.   The patient was advised to call back or seek an in-person evaluation if the symptoms worsen or if the condition fails to improve as anticipated.  I provided 14 minutes of non-face-to-face time during this encounter.   [name], MD

## 2020-07-18 ENCOUNTER — Other Ambulatory Visit: Payer: Self-pay | Admitting: Family Medicine

## 2020-07-18 NOTE — Telephone Encounter (Signed)
Patient needs HTN follow up. Please call patient to schedule an appointment.

## 2020-07-22 NOTE — Telephone Encounter (Signed)
Called and left voicemail for patient to call back and schedule appointment.  

## 2020-07-26 ENCOUNTER — Other Ambulatory Visit (HOSPITAL_COMMUNITY): Payer: Self-pay | Admitting: Psychiatry

## 2020-07-26 ENCOUNTER — Telehealth (HOSPITAL_COMMUNITY): Payer: Self-pay

## 2020-07-26 DIAGNOSIS — F411 Generalized anxiety disorder: Secondary | ICD-10-CM

## 2020-07-26 MED ORDER — LORAZEPAM 1 MG PO TABS: 1.0000 mg | ORAL_TABLET | Freq: Every day | ORAL | 0 refills | Status: DC | PRN

## 2020-07-26 NOTE — Telephone Encounter (Signed)
Medication management - Telephone call with pt after she left a message this morning stating she had an "anxiety attack" today and could not go to work.  Patient denied any current suicidal or homicidal ideations, no plan, intent, or means to harm self or others at this time.  Patient states she was worked up some because when she called out for work today because of her increased anxiety, they called back and threatened her she may be in danger of losing her job without an note or excuse from her provider to cover today's absence.  Agreed to send this request to Dr. Doyne Keel and provided patient the address and phone number to the [facility] if she had any worsening symptoms or needed any immediate assistance along with the crisis line phone number.  Again patient denied any current SI, no plan, intent or means.  Informed would attempt to contact Dr. Doyne Keel today and patient agreed to call back on Monday 07/29/20 if had not heard back by then.  At the end of the call, patient got another call and it was actually Dr. Doyne Keel calling her back.  Patient spoke to Dr. Doyne Keel who she reported was going to send her a letter for work and discussed some immediate medication changes with her per patient's report while this nurse was on hold.  Patient agreed if any worsening symptoms she would call or come into the [facility] or call the [facility] crisis line as needed.  Patient calmer after speaking to provider per her report and agreed to call back in the coming week to let us know if doing better or any other issues.

## 2020-07-26 NOTE — Progress Notes (Signed)
Wendy Duffy called the clinic asking to speak with me. She woke up this morning with a severe panic attack and it has not abated yet. She has tried deep breathing, isolation and lying down. She called into work and was told there was nothing could be done without a doctor's note.   - we talked about deep breathing, progressive muscle relaxation and distraction as ways of coping with the panic attack. We identified a progressive muscle relaxation episode on youtube and she has agreed to try it.  - I have sent a e-script of Ativan 1mg  po qD prn anxiety #5 tabs with no refills in  - I wrote a letter excusing her from work for 1/2 day today.   She was agreeable to the plan.

## 2020-08-02 ENCOUNTER — Encounter: Payer: Self-pay | Admitting: Family Medicine

## 2020-08-02 ENCOUNTER — Ambulatory Visit (INDEPENDENT_AMBULATORY_CARE_PROVIDER_SITE_OTHER): Payer: Self-pay | Admitting: Family Medicine

## 2020-08-02 ENCOUNTER — Other Ambulatory Visit: Payer: Self-pay

## 2020-08-02 VITALS — BP 142/95 | HR 96 | Ht 61.0 in | Wt 202.6 lb

## 2020-08-02 DIAGNOSIS — I10 Essential (primary) hypertension: Secondary | ICD-10-CM

## 2020-08-02 DIAGNOSIS — M545 Low back pain, unspecified: Secondary | ICD-10-CM

## 2020-08-02 DIAGNOSIS — R7309 Other abnormal glucose: Secondary | ICD-10-CM

## 2020-08-02 DIAGNOSIS — G8929 Other chronic pain: Secondary | ICD-10-CM

## 2020-08-02 DIAGNOSIS — Z1159 Encounter for screening for other viral diseases: Secondary | ICD-10-CM

## 2020-08-02 MED ORDER — VALSARTAN 80 MG PO TABS
80.0000 mg | ORAL_TABLET | Freq: Every day | ORAL | 1 refills | Status: DC
Start: 2020-08-02 — End: 2020-10-16

## 2020-08-02 MED ORDER — CYCLOBENZAPRINE HCL 10 MG PO TABS
10.0000 mg | ORAL_TABLET | Freq: Every day | ORAL | 1 refills | Status: DC
Start: 2020-08-02 — End: 2021-08-06

## 2020-08-02 NOTE — Progress Notes (Signed)
   SUBJECTIVE:   CHIEF COMPLAINT / HPI:   Chief Complaint  Patient presents with  . Hypertension     Wendy Duffy is a 45 y.o. female here for HTN and chronic back pain follow up. Back pain is unchanged. Taking OTC pain relievers and Flexeril with some relief. Complains of lower back pain that is worse after sitting for long periods.   Takes HCTZ, Losartan and amlodipine. Denies missing doses of antihypertensive medications. Denies chest pain, palpitations, lower extremity edema, exertional dyspnea, lightheadedness, headaches and vision changes. States home systolic BP 606T.  Home BP Monitoring: Yes      PERTINENT  PMH / PSH: reviewed and updated as appropriate   OBJECTIVE:   BP (!) 142/95   Pulse 96   Ht 5\' 1"  (1.549 m)   Wt 202 lb 9.6 oz (91.9 kg)   LMP 09/20/2015 (Exact Date)   SpO2 97%   BMI 38.28 kg/m    GEN: well appearing female, in no acute distress  CV: regular rate and rhythm, no murmurs appreciated  RESP: no increased work of breathing, clear to ascultation bilaterally MSK: no edema, back: no C,T or L midline or paraspinal tenderness, no deformity, no stepoffs,  SKIN: warm, dry    ASSESSMENT/PLAN:   HYPERTENSION, BENIGN BP not at goal. 142/95. Switch from losartan to valsartan for tighter BP control. Continue HCTZ and amlodipine. Obtain CMP, a1c and lipid panel.   Back pain Chronic. Refilled flexeril. Suspect some element of muscular weakness given pattern of pain. Discussed physical therapy and patient agreeable. Referral placed. Continue OTC analgesics for pain.     Lyndee Hensen, DO PGY-2, [facility] 08/02/2020

## 2020-08-02 NOTE — Patient Instructions (Signed)
It was great seeing you today!  Please check-out at the front desk before leaving the clinic. I'd like to see you back in 1 month but if you need to be seen earlier than that for any new issues we're happy to fit you in, just give [place] a call!  Visit Remembers: - Stop by the pharmacy to pick up your prescriptions  - Continue to work on your healthy eating habits and incorporating exercise into your daily life.  - Your goal is to have an BP < 120/80  - Medicine Changes:   ** Stop taking Losartan. Start Valsartan 80 mg daily.   ** Look for out for a phone call about scheduling physical therapy. Take Flexeril at bed time.    Regarding lab work today:  Due to recent changes in healthcare laws, you may see the results of your imaging and laboratory studies on MyChart before your provider has had a chance to review them.  I understand that in some cases there may be results that are confusing or concerning to you. Not all laboratory results come back in the same time frame and you may be waiting for multiple results in order to interpret others.  Please give [place] 72 hours in order for your provider to thoroughly review all the results before contacting the office for clarification of your results. If everything is normal, you will get a letter in the mail or a message in My Chart. Please give [place] a call if you do not hear from [place] after 2 weeks.  Please bring all of your medications with you to each visit.    If you haven't already, sign up for My Chart to have easy access to your labs results, and communication with your primary care physician.  Feel free to call with any questions or concerns at any time, at [phone].   Take care,  Dr. Rushie [facility]

## 2020-08-03 LAB — HEMOGLOBIN A1C
Est. average glucose Bld gHb Est-mCnc: 91 mg/dL
Hgb A1c MFr Bld: 4.8 % (ref 4.8–5.6)

## 2020-08-03 LAB — COMPREHENSIVE METABOLIC PANEL
ALT: 14 IU/L (ref 0–32)
AST: 13 IU/L (ref 0–40)
Albumin/Globulin Ratio: 1.8 (ref 1.2–2.2)
Albumin: 4.5 g/dL (ref 3.8–4.8)
Alkaline Phosphatase: 82 IU/L (ref 44–121)
BUN/Creatinine Ratio: 11 (ref 9–23)
BUN: 10 mg/dL (ref 6–24)
Bilirubin Total: 0.6 mg/dL (ref 0.0–1.2)
CO2: 24 mmol/L (ref 20–29)
Calcium: 9.9 mg/dL (ref 8.7–10.2)
Chloride: 100 mmol/L (ref 96–106)
Creatinine, Ser: 0.93 mg/dL (ref 0.57–1.00)
Globulin, Total: 2.5 g/dL (ref 1.5–4.5)
Glucose: 82 mg/dL (ref 65–99)
Potassium: 3.9 mmol/L (ref 3.5–5.2)
Sodium: 140 mmol/L (ref 134–144)
Total Protein: 7 g/dL (ref 6.0–8.5)
eGFR: 78 mL/min/{1.73_m2} (ref >59)

## 2020-08-03 LAB — LIPID PANEL
Chol/HDL Ratio: 2.5 ratio (ref 0.0–4.4)
Cholesterol, Total: 178 mg/dL (ref 100–199)
HDL: 71 mg/dL (ref >39)
LDL Chol Calc (NIH): 92 mg/dL (ref 0–99)
Triglycerides: 79 mg/dL (ref 0–149)
VLDL Cholesterol Cal: 15 mg/dL (ref 5–40)

## 2020-08-03 LAB — HEPATITIS C ANTIBODY: Hep C Virus Ab: <0.1 s/co ratio (ref 0.0–0.9)

## 2020-08-05 ENCOUNTER — Encounter: Payer: Self-pay | Admitting: Family Medicine

## 2020-08-05 NOTE — Assessment & Plan Note (Signed)
BP not at goal. 142/95. Switch from losartan to valsartan for tighter BP control. Continue HCTZ and amlodipine. Obtain CMP, a1c and lipid panel.

## 2020-08-05 NOTE — Assessment & Plan Note (Signed)
Chronic. Refilled flexeril. Suspect some element of muscular weakness given pattern of pain. Discussed physical therapy and patient agreeable. Referral placed. Continue OTC analgesics for pain.

## 2020-09-05 ENCOUNTER — Other Ambulatory Visit: Payer: Self-pay

## 2020-09-05 ENCOUNTER — Telehealth (INDEPENDENT_AMBULATORY_CARE_PROVIDER_SITE_OTHER): Payer: BC Managed Care – PPO | Admitting: Psychiatry

## 2020-09-05 DIAGNOSIS — F422 Mixed obsessional thoughts and acts: Secondary | ICD-10-CM

## 2020-09-05 DIAGNOSIS — F99 Mental disorder, not otherwise specified: Secondary | ICD-10-CM

## 2020-09-05 DIAGNOSIS — F331 Major depressive disorder, recurrent, moderate: Secondary | ICD-10-CM | POA: Diagnosis not present

## 2020-09-05 DIAGNOSIS — F411 Generalized anxiety disorder: Secondary | ICD-10-CM | POA: Diagnosis not present

## 2020-09-05 DIAGNOSIS — F5105 Insomnia due to other mental disorder: Secondary | ICD-10-CM

## 2020-09-05 MED ORDER — VENLAFAXINE HCL ER 75 MG PO CP24: 225.0000 mg | ORAL_CAPSULE | Freq: Every day | ORAL | 0 refills | Status: DC

## 2020-09-05 MED ORDER — QUETIAPINE FUMARATE 300 MG PO TABS: 300.0000 mg | ORAL_TABLET | Freq: Every day | ORAL | 0 refills | Status: DC

## 2020-09-05 MED ORDER — BUPROPION HCL ER (XL) 300 MG PO TB24: 300.0000 mg | ORAL_TABLET | Freq: Every day | ORAL | 0 refills | Status: DC

## 2020-09-05 NOTE — Progress Notes (Signed)
Virtual Visit via Telephone Note  I connected with Wendy Duffy on 09/05/20 at 10:30 AM EDT by telephone and verified that I am speaking with the correct person using two identifiers. She was having difficulty get her mychart to work so we opted to continue by phone  Location: Patient: work Provider: office   I discussed the limitations, risks, security and privacy concerns of performing an evaluation and management service by telephone and the availability of in person appointments. I also discussed with the patient that there may be a patient responsible charge related to this service. The patient expressed understanding and agreed to proceed.   History of Present Illness: Wendy Duffy is overwhelmed. She is graduating this week. Her job is terrible. She does not think she will continue due to the job conditions and triggers. She is job Field seismologist at other schools. Her anxiety is situational but she dwells on it. She She gives the example of having a job interview yesterday that did not go well. worries about 1/2 day. She has racing thoughts and ruminates on her worries. Her sleep is good and she is sleeping thru night. Her energy is fair except for the AMs. Her PCP changed her BP meds and that has helped. Her appetite and concentration are good. She is studying well. Wendy Duffy has on/off days of getting down on herself, especially when anxious. Depression is ongoing. She has been depressed about 5/14 days lately. She denies anhedonia but is mostly focused on school right now. She has not been taking Ativan.    Observations/Objective:  General Appearance: unable to assess  Eye Contact:  unable to assess  Speech:  Clear and Coherent and Normal Rate  Volume:  Normal  Mood:  Anxious and Depressed  Affect:  Full Range  Thought Process:  Goal Directed, Linear, and Descriptions of Associations: Intact  Orientation:  Full (Time, Place, and Person)  Thought Content:  Logical  Suicidal Thoughts:  No   Homicidal Thoughts:  No  Memory:  Immediate;   Good  Judgement:  Good  Insight:  Good  Psychomotor Activity: unable to assess  Concentration:  Concentration: Good  Recall:  Good  Fund of Knowledge:  Good  Language:  Good  Akathisia:  unable to assess  Handed:  Right  AIMS (if indicated):     Assets:  Communication Skills Desire for Improvement Financial Resources/Insurance Housing Intimacy Resilience Social Support Talents/Skills Transportation Vocational/Educational  ADL's:  unable to assess  Cognition:  WNL  Sleep:        Assessment and Plan: Depression screen [facility] 2/9 09/05/2020 07/11/2020 02/23/2019 07/09/2017 03/24/2017  Decreased Interest 1 1 2  0 0  Down, Depressed, Hopeless 1 2 1  0 0  PHQ - 2 Score 2 3 3  0 0  Altered sleeping 0 1 - - -  Tired, decreased energy 0 0 - - -  Change in appetite 0 3 - - -  Feeling bad or failure about yourself  1 3 - - -  Trouble concentrating 0 0 - - -  Moving slowly or fidgety/restless 0 0 - - -  Suicidal thoughts 0 0 - - -  PHQ-9 Score 3 10 - - -  Difficult doing work/chores Somewhat difficult Somewhat difficult - - -  Some recent data might be hidden    Flowsheet Row Video Visit from 09/05/2020 in [facility] Video Visit from 07/11/2020 in [facility] Admission (Discharged) from 07/15/2018 in [facility]  C-SSRS RISK CATEGORY No Risk No Risk Moderate Risk      Reviewed labs done 08/02/20 - pt is working with her PCP  Increase Seroquel to target ongoing anxiety  1. MDD (major depressive disorder), recurrent episode, moderate (HCC) - venlafaxine XR (EFFEXOR-XR) 75 MG 24 hr capsule; Take 3 capsules (225 mg total) by mouth daily.  Dispense: 270 capsule; Refill: 0 - QUEtiapine (SEROQUEL) 300 MG tablet; Take 1 tablet (300 mg total) by mouth at bedtime.  Dispense: 90 tablet; Refill: 0 - buPROPion (WELLBUTRIN XL) 300 MG 24 hr  tablet; Take 1 tablet (300 mg total) by mouth daily.  Dispense: 90 tablet; Refill: 0  2. GAD (generalized anxiety disorder) - venlafaxine XR (EFFEXOR-XR) 75 MG 24 hr capsule; Take 3 capsules (225 mg total) by mouth daily.  Dispense: 270 capsule; Refill: 0 - QUEtiapine (SEROQUEL) 300 MG tablet; Take 1 tablet (300 mg total) by mouth at bedtime.  Dispense: 90 tablet; Refill: 0  3. Mixed obsessional thoughts and acts - venlafaxine XR (EFFEXOR-XR) 75 MG 24 hr capsule; Take 3 capsules (225 mg total) by mouth daily.  Dispense: 270 capsule; Refill: 0 - QUEtiapine (SEROQUEL) 300 MG tablet; Take 1 tablet (300 mg total) by mouth at bedtime.  Dispense: 90 tablet; Refill: 0  4. Insomnia due to other mental disorder - QUEtiapine (SEROQUEL) 300 MG tablet; Take 1 tablet (300 mg total) by mouth at bedtime.  Dispense: 90 tablet; Refill: 0     Follow Up Instructions: In 2-3 months or sooner if needed   I discussed the assessment and treatment plan with the patient. The patient was provided an opportunity to ask questions and all were answered. The patient agreed with the plan and demonstrated an understanding of the instructions.   The patient was advised to call back or seek an in-person evaluation if the symptoms worsen or if the condition fails to improve as anticipated.  I provided 19 minutes of non-face-to-face time during this encounter.   [name], MD

## 2020-10-07 ENCOUNTER — Other Ambulatory Visit (HOSPITAL_COMMUNITY): Payer: Self-pay | Admitting: Psychiatry

## 2020-10-07 DIAGNOSIS — F422 Mixed obsessional thoughts and acts: Secondary | ICD-10-CM

## 2020-10-07 DIAGNOSIS — F99 Mental disorder, not otherwise specified: Secondary | ICD-10-CM

## 2020-10-07 DIAGNOSIS — F331 Major depressive disorder, recurrent, moderate: Secondary | ICD-10-CM

## 2020-10-07 DIAGNOSIS — F411 Generalized anxiety disorder: Secondary | ICD-10-CM

## 2020-10-07 DIAGNOSIS — F5105 Insomnia due to other mental disorder: Secondary | ICD-10-CM

## 2020-10-08 IMAGING — RF DG KNEE 1-2V*L*
1 series · 2 of 2 positions shown · non-contrast
Comparison: None.

CLINICAL DATA: Elective surgery.

EXAM:
LEFT KNEE - 1-2 VIEW; DG C-ARM 1-60 MIN-NO REPORT

[Series 1: run · 2 of 2 slices shown]
[im 1/2]
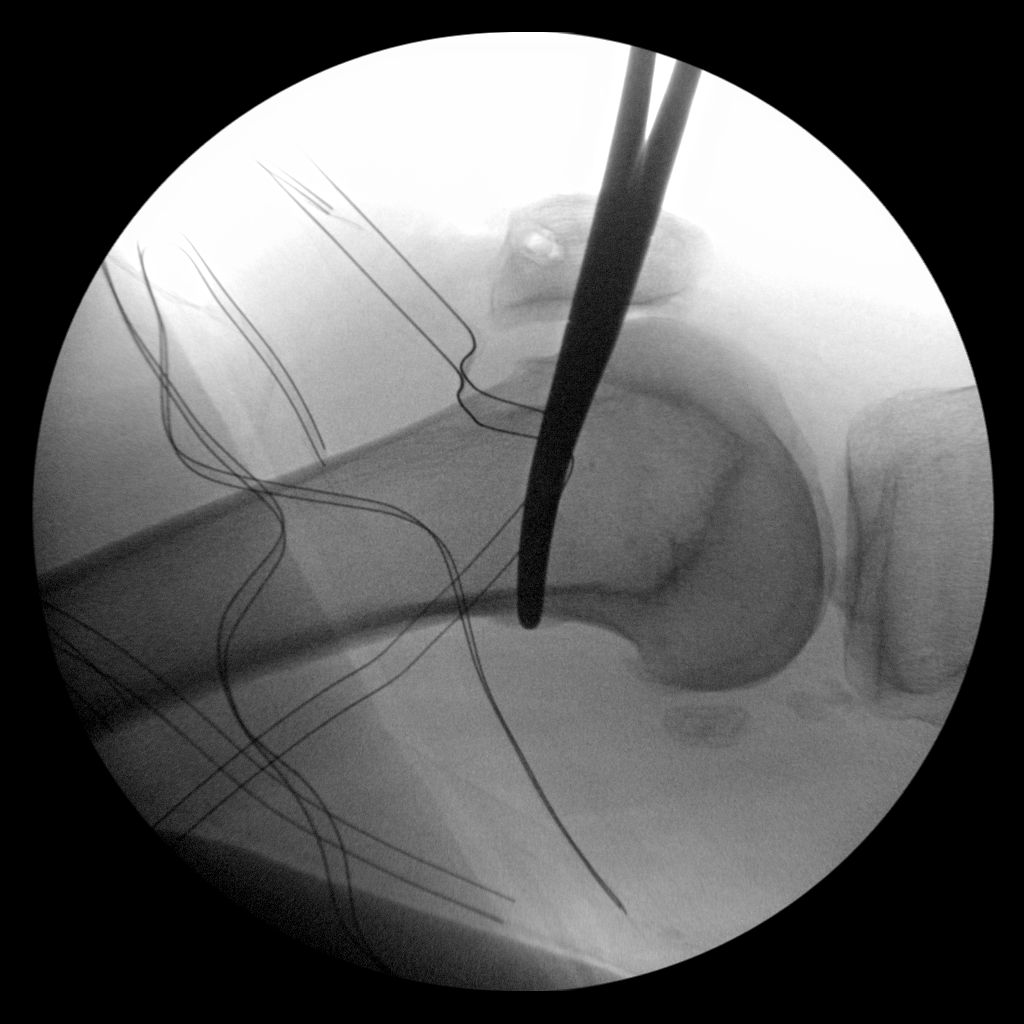
[im 2/2]
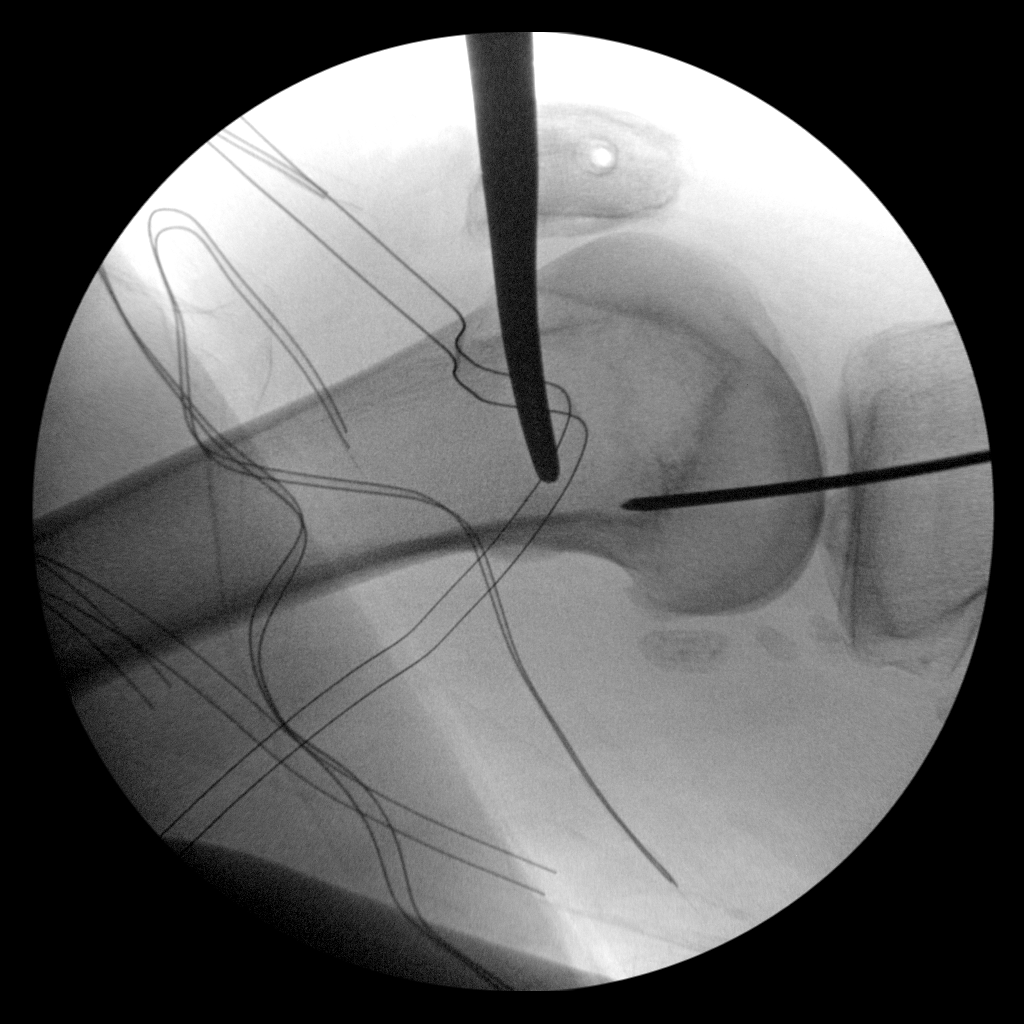

[2 of 2 positions shown; findings below may reference images not displayed]

FINDINGS: Two fluoroscopic spot views in the operating room provided. Surgical
instruments project over the patella and distal femur. Fluoroscopy
time 23 seconds.
IMPRESSION: Intraoperative fluoroscopy during knee surgery.

## 2020-10-15 ENCOUNTER — Other Ambulatory Visit (HOSPITAL_COMMUNITY): Payer: Self-pay | Admitting: Psychiatry

## 2020-10-15 DIAGNOSIS — F5105 Insomnia due to other mental disorder: Secondary | ICD-10-CM

## 2020-10-15 DIAGNOSIS — F331 Major depressive disorder, recurrent, moderate: Secondary | ICD-10-CM

## 2020-10-15 DIAGNOSIS — F411 Generalized anxiety disorder: Secondary | ICD-10-CM

## 2020-10-15 DIAGNOSIS — F422 Mixed obsessional thoughts and acts: Secondary | ICD-10-CM

## 2020-10-16 ENCOUNTER — Other Ambulatory Visit: Payer: Self-pay | Admitting: Family Medicine

## 2020-10-16 DIAGNOSIS — I10 Essential (primary) hypertension: Secondary | ICD-10-CM

## 2020-11-28 ENCOUNTER — Telehealth (HOSPITAL_COMMUNITY): Payer: Self-pay | Admitting: Psychiatry

## 2020-11-28 ENCOUNTER — Other Ambulatory Visit: Payer: Self-pay

## 2020-12-12 ENCOUNTER — Other Ambulatory Visit: Payer: Self-pay

## 2020-12-12 ENCOUNTER — Telehealth (HOSPITAL_BASED_OUTPATIENT_CLINIC_OR_DEPARTMENT_OTHER): Payer: BC Managed Care – PPO | Admitting: Psychiatry

## 2020-12-12 DIAGNOSIS — F5105 Insomnia due to other mental disorder: Secondary | ICD-10-CM

## 2020-12-12 DIAGNOSIS — F99 Mental disorder, not otherwise specified: Secondary | ICD-10-CM

## 2020-12-12 DIAGNOSIS — F422 Mixed obsessional thoughts and acts: Secondary | ICD-10-CM

## 2020-12-12 DIAGNOSIS — F331 Major depressive disorder, recurrent, moderate: Secondary | ICD-10-CM

## 2020-12-12 DIAGNOSIS — F411 Generalized anxiety disorder: Secondary | ICD-10-CM

## 2020-12-12 MED ORDER — VENLAFAXINE HCL ER 75 MG PO CP24: 225.0000 mg | ORAL_CAPSULE | Freq: Every day | ORAL | 0 refills | Status: DC

## 2020-12-12 MED ORDER — QUETIAPINE FUMARATE 300 MG PO TABS: 300.0000 mg | ORAL_TABLET | Freq: Every day | ORAL | 0 refills | Status: DC

## 2020-12-12 MED ORDER — BUPROPION HCL ER (XL) 300 MG PO TB24: 300.0000 mg | ORAL_TABLET | Freq: Every day | ORAL | 0 refills | Status: DC

## 2020-12-12 NOTE — Progress Notes (Signed)
Virtual Visit via Telephone Note  I connected with Wendy Duffy on 12/12/20 at  4:15 PM EDT by telephone and verified that I am speaking with the correct person using two identifiers.  Location: Patient: home Provider: office   I discussed the limitations, risks, security and privacy concerns of performing an evaluation and management service by telephone and the availability of in person appointments. I also discussed with the patient that there may be a patient responsible charge related to this service. The patient expressed understanding and agreed to proceed.   History of Present Illness: Wendy Duffy is doing better. 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 She has finished her classes so her stress has decreased. Wendy Duffy is feeling content. She denies depression. She denies sad mood, crying spells, hopelessness and anhedonia. Wendy Duffy's sleep is good. She denies SI/HI. Her anxiety is overall well managed. She continues to check her doors and iron and stoves at least twice when she leaves her house or going to bed.    Observations/Objective:  General Appearance: unable to assess  Eye Contact:  unable to assess  Speech:  Clear and Coherent and Normal Rate  Volume:  Normal  Mood:  Euthymic  Affect:  Full Range  Thought Process:  Goal Directed, Linear, and Descriptions of Associations: Intact  Orientation:  Full (Time, Place, and Person)  Thought Content:  Logical  Suicidal Thoughts:  No  Homicidal Thoughts:  No  Memory:  Immediate;   Good  Judgement:  Good  Insight:  Good  Psychomotor Activity: unable to assess  Concentration:  Concentration: Good  Recall:  Good  Fund of Knowledge:  Good  Language:  Good  Akathisia:  unable to assess  Handed:  unable to assess  AIMS (if indicated):     Assets:  Communication Skills Desire for Improvement Financial Resources/Insurance Housing Resilience Social  Support Talents/Skills Transportation Vocational/Educational  ADL's:  unable to assess  Cognition:  WNL  Sleep:        Assessment and Plan: Depression screen [facility] 2/9 12/12/2020 09/05/2020 07/11/2020 02/23/2019 07/09/2017  Decreased Interest 0 1 1 2  0  Down, Depressed, Hopeless 0 1 2 1  0  PHQ - 2 Score 0 2 3 3  0  Altered sleeping - 0 1 - -  Tired, decreased energy - 0 0 - -  Change in appetite - 0 3 - -  Feeling bad or failure about yourself  - 1 3 - -  Trouble concentrating - 0 0 - -  Moving slowly or fidgety/restless - 0 0 - -  Suicidal thoughts - 0 0 - -  PHQ-9 Score - 3 10 - -  Difficult doing work/chores - Somewhat difficult Somewhat difficult - -  Some recent data might be hidden    Flowsheet Row Video Visit from 12/12/2020 in [facility] Video Visit from 09/05/2020 in [facility] Video Visit from 07/11/2020 in [facility]  C-SSRS RISK CATEGORY No Risk No Risk No Risk      - she is working with her PCP regarding her BP   - reviewed labs done 08/02/20- CBC, CMP, HbA1c, lipid panel all WNL  1. MDD (major depressive disorder), recurrent episode, moderate (HCC) - buPROPion (WELLBUTRIN XL) 300 MG 24 hr tablet; Take 1 tablet (300 mg total) by mouth daily.  Dispense: 90 tablet; Refill: 0 - QUEtiapine (SEROQUEL) 300 MG tablet; Take 1 tablet (300 mg total) by mouth  at bedtime.  Dispense: 90 tablet; Refill: 0 - venlafaxine XR (EFFEXOR-XR) 75 MG 24 hr capsule; Take 3 capsules (225 mg total) by mouth daily.  Dispense: 270 capsule; Refill: 0  2. GAD (generalized anxiety disorder) - QUEtiapine (SEROQUEL) 300 MG tablet; Take 1 tablet (300 mg total) by mouth at bedtime.  Dispense: 90 tablet; Refill: 0 - venlafaxine XR (EFFEXOR-XR) 75 MG 24 hr capsule; Take 3 capsules (225 mg total) by mouth daily.  Dispense: 270 capsule; Refill: 0  3. Mixed obsessional thoughts and  acts - QUEtiapine (SEROQUEL) 300 MG tablet; Take 1 tablet (300 mg total) by mouth at bedtime.  Dispense: 90 tablet; Refill: 0 - venlafaxine XR (EFFEXOR-XR) 75 MG 24 hr capsule; Take 3 capsules (225 mg total) by mouth daily.  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  Dispense: 90 tablet; Refill: 0   Follow Up Instructions: In 2-3 months or sooner if needed   I discussed the assessment and treatment plan with the patient. The patient was provided an opportunity to ask questions and all were answered. The patient agreed with the plan and demonstrated an understanding of the instructions.   The patient was advised to call back or seek an in-person evaluation if the symptoms worsen or if the condition fails to improve as anticipated.  I provided 11 minutes of non-face-to-face time during this encounter.   [name], MD

## 2020-12-25 ENCOUNTER — Other Ambulatory Visit: Payer: Self-pay

## 2020-12-25 DIAGNOSIS — I10 Essential (primary) hypertension: Secondary | ICD-10-CM

## 2020-12-27 MED ORDER — AMLODIPINE BESYLATE 10 MG PO TABS: 10.0000 mg | ORAL_TABLET | Freq: Every day | ORAL | 2 refills | Status: DC

## 2020-12-27 MED ORDER — HYDROCHLOROTHIAZIDE 25 MG PO TABS: 25.0000 mg | ORAL_TABLET | Freq: Every day | ORAL | 0 refills | Status: DC

## 2020-12-27 MED ORDER — VALSARTAN 80 MG PO TABS: 80.0000 mg | ORAL_TABLET | Freq: Every day | ORAL | 1 refills | Status: DC

## 2020-12-30 ENCOUNTER — Telehealth: Payer: BC Managed Care – PPO | Admitting: Family Medicine

## 2020-12-30 ENCOUNTER — Telehealth (HOSPITAL_COMMUNITY): Payer: Self-pay | Admitting: Psychiatry

## 2020-12-30 DIAGNOSIS — T3695XA Adverse effect of unspecified systemic antibiotic, initial encounter: Secondary | ICD-10-CM

## 2020-12-30 DIAGNOSIS — B379 Candidiasis, unspecified: Secondary | ICD-10-CM | POA: Diagnosis not present

## 2020-12-30 DIAGNOSIS — J0140 Acute pansinusitis, unspecified: Secondary | ICD-10-CM

## 2020-12-30 MED ORDER — AMOXICILLIN-POT CLAVULANATE 875-125 MG PO TABS: 1.0000 | ORAL_TABLET | Freq: Two times a day (BID) | ORAL | 0 refills | Status: AC

## 2020-12-30 MED ORDER — FLUCONAZOLE 150 MG PO TABS: 150.0000 mg | ORAL_TABLET | Freq: Once | ORAL | 0 refills | Status: AC

## 2020-12-30 NOTE — Patient Instructions (Addendum)
I appreciate the opportunity to provide you with care for your health and wellness.  Take medication as directed  Have a safe and enjoyable trip.  Please continue to practice social distancing to keep you, your family, and our community safe.  If you must go out, please wear a mask and practice good handwashing.  Have a wonderful day. With Gratitude, Cherly Beach, DNP, AGNP-BC

## 2020-12-30 NOTE — Progress Notes (Signed)
Virtual Visit Consent   Wendy Duffy, you are scheduled for a virtual visit with a [facility] provider today.     Just as with appointments in the office, your consent must be obtained to participate.  Your consent will be active for this visit and any virtual visit you may have with one of our providers in the next 365 days.     If you have a MyChart account, a copy of this consent can be sent to you electronically.  All virtual visits are billed to your insurance company just like a traditional visit in the office.    As this is a virtual visit, video technology does not allow for your provider to perform a traditional examination.  This may limit your provider's ability to fully assess your condition.  If your provider identifies any concerns that need to be evaluated in person or the need to arrange testing (such as labs, EKG, etc.), we will make arrangements to do so.     Although advances in technology are sophisticated, we cannot ensure that it will always work on either your end or our end.  If the connection with a video visit is poor, the visit may have to be switched to a telephone visit.  With either a video or telephone visit, we are not always able to ensure that we have a secure connection.     I need to obtain your verbal consent now.   Are you willing to proceed with your visit today?    Wendy Duffy has provided verbal consent on 12/30/2020 for a virtual visit (video or telephone).   Wendy Mayo, NP   Date: 12/30/2020 11:11 AM   Virtual Visit via Video Note   I, Wendy Duffy, connected with  Wendy Duffy  ([id number], 1975/04/05) on 12/30/20 at 11:00 AM EDT by a video-enabled telemedicine application and verified that I am speaking with the correct person using two identifiers.  Location: Patient: Virtual Visit Location Patient: Home Provider: Virtual Visit Location Provider: Home Office   I discussed the limitations of evaluation and management by  telemedicine and the availability of in person appointments. The patient expressed understanding and agreed to proceed.    History of Present Illness: [name] is a 45 y.o. who identifies as a female who was assigned female at birth, and is being seen today for cold signs. 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 She reports taking everything over the counter she can think of. Started with congestion, post nasal drip, headache has started in last few days. Head pressure across face and forehead. Mild cough- but has resolved overall.  Denies fever, chills, ear pain and sore throat, shortness of breath, chest pain or known contacts.   COVID negative with HT  Problems:  Patient Active Problem List   Diagnosis Date Noted   Nasal congestion 04/19/2020   MDD (major depressive disorder), recurrent episode ([place]) 07/15/2018   Cough 03/24/2017   Scabies infestation 11/12/2016   Back pain 03/30/2016   Status post laparoscopic hysterectomy 10/21/2015   Anemia 09/20/2015   Pap smear abnormality of cervix/human papillomavirus (HPV) positive 04/09/2015   MDD (major depressive disorder), recurrent episode, moderate ([place]) 01/15/2015   GAD (generalized anxiety disorder) 01/15/2015   Migraine 01/31/2014   Birth control 06/14/2012   Depression 04/06/2012   Viral URI with cough 07/14/2011   Preventative health care 12/12/2010   Anxiety 05/21/2010   Disturbance  in sleep behavior 10/01/2009   VENTRICULAR HYPERTROPHY, LEFT 06/15/2007   HYPERTENSION, BENIGN 05/26/2006   OBESITY, NOS 05/13/2006    Allergies:  Allergies  Allergen Reactions   Zoloft [Sertraline Hcl]     Made her feel very anxious    Medications:  Current Outpatient Medications:    amoxicillin-clavulanate (AUGMENTIN) 875-125 MG tablet, Take 1 tablet by mouth 2 (two) times daily for 7 days., Disp: 14 tablet, Rfl: 0   fluconazole (DIFLUCAN) 150 MG tablet, Take 1 tablet (150 mg total) by mouth once  for 1 dose., Disp: 1 tablet, Rfl: 0   acetaminophen (TYLENOL) 500 MG tablet, Take 1,000 mg by mouth every 6 (six) hours as needed for mild pain or headache., Disp: , Rfl:    amLODipine (NORVASC) 10 MG tablet, Take 1 tablet (10 mg total) by mouth daily., Disp: 30 tablet, Rfl: 2   buPROPion (WELLBUTRIN XL) 300 MG 24 hr tablet, Take 1 tablet (300 mg total) by mouth daily., Disp: 90 tablet, Rfl: 0   cetirizine (ZYRTEC) 10 MG tablet, Take 1 tablet (10 mg total) by mouth daily as needed for allergies., Disp: 30 tablet, Rfl: 2   cyclobenzaprine (FLEXERIL) 10 MG tablet, Take 1 tablet (10 mg total) by mouth at bedtime., Disp: 60 tablet, Rfl: 1   fluticasone (FLONASE) 50 MCG/ACT nasal spray, Place 2 sprays into both nostrils daily. (Patient taking differently: Place 2 sprays into both nostrils daily as needed for allergies.), Disp: 16 g, Rfl: 6   hydrochlorothiazide (HYDRODIURIL) 25 MG tablet, Take 1 tablet (25 mg total) by mouth daily., Disp: 30 tablet, Rfl: 0   omeprazole (PRILOSEC) 40 MG capsule, take 1 capsule by mouth once daily (Patient taking differently: as needed.), Disp: 90 capsule, Rfl: 6   QUEtiapine (SEROQUEL) 300 MG tablet, Take 1 tablet (300 mg total) by mouth at bedtime., Disp: 90 tablet, Rfl: 0   valsartan (DIOVAN) 80 MG tablet, Take 1 tablet (80 mg total) by mouth at bedtime., Disp: 30 tablet, Rfl: 1   venlafaxine XR (EFFEXOR-XR) 75 MG 24 hr capsule, Take 3 capsules (225 mg total) by mouth daily., Disp: 270 capsule, Rfl: 0  Observations/Objective: Patient is well-developed, well-nourished in no acute distress.  Resting comfortably at home.  Head is normocephalic, atraumatic.  No labored breathing.  Speech is clear and coherent with logical content.  Patient is alert and oriented at baseline.    Assessment and Plan: 1. Acute non-recurrent pansinusitis  - amoxicillin-clavulanate (AUGMENTIN) 875-125 MG tablet; Take 1 tablet by mouth 2 (two) times daily for 7 days.  Dispense: 14 tablet;  Refill: 0  2. Antibiotic-induced yeast infection  - fluconazole (DIFLUCAN) 150 MG tablet; Take 1 tablet (150 mg total) by mouth once for 1 dose.  Dispense: 1 tablet; Refill: 0  -S&S are consistent with infection of sinus  -aug provided -yeast post antibiotic use- coverage provided -OTC measures reviewed    Reviewed side effects, risks and benefits of medication.   Patient acknowledged agreement and understanding of the plan.   I discussed the assessment and treatment plan with the patient. The patient was provided an opportunity to ask questions and all were answered. The patient agreed with the plan and demonstrated an understanding of the instructions.   The patient was advised to call back or seek an in-person evaluation if the symptoms worsen or if the condition fails to improve as anticipated.   The above assessment and management plan was discussed with the patient. The patient verbalized understanding of and has  agreed to the management plan. Patient is aware to call the clinic if symptoms persist or worsen. Patient is aware when to return to the clinic for a follow-up visit. Patient educated on when it is appropriate to go to the emergency department.     Follow Up Instructions: I discussed the assessment and treatment plan with the patient. The patient was provided an opportunity to ask questions and all were answered. The patient agreed with the plan and demonstrated an understanding of the instructions.  A copy of instructions were sent to the patient via MyChart unless otherwise noted below.    The patient was advised to call back or seek an in-person evaluation if the symptoms worsen or if the condition fails to improve as anticipated.  Time:  I spent 10 minutes with the patient via telehealth technology discussing the above problems/concerns.    Wendy Mayo, NP

## 2021-01-20 ENCOUNTER — Other Ambulatory Visit: Payer: Self-pay

## 2021-01-20 ENCOUNTER — Ambulatory Visit (INDEPENDENT_AMBULATORY_CARE_PROVIDER_SITE_OTHER): Payer: BC Managed Care – PPO | Admitting: Family Medicine

## 2021-01-20 ENCOUNTER — Encounter: Payer: Self-pay | Admitting: Family Medicine

## 2021-01-20 VITALS — BP 142/97 | HR 101 | Temp 99.1°F | Wt 211.6 lb

## 2021-01-20 DIAGNOSIS — J069 Acute upper respiratory infection, unspecified: Secondary | ICD-10-CM

## 2021-01-20 DIAGNOSIS — M791 Myalgia, unspecified site: Secondary | ICD-10-CM

## 2021-01-20 LAB — POCT INFLUENZA A/B
Influenza A, POC: NEGATIVE
Influenza B, POC: NEGATIVE

## 2021-01-20 NOTE — Progress Notes (Signed)
   SUBJECTIVE:   CHIEF COMPLAINT / HPI:   Chief Complaint  Patient presents with   Follow-up     Wendy Duffy is a 45 y.o. female here for myalgias.  Patient reports a daily lams.  She has recently traveled to [place] but and returned on the 24th.  She was exposed to someone with COVID on the 22nd.  She also works at a school.  States that her aunt in law had COVID 2 weeks ago.  He has been around this person.  He was diagnosed with a sinus infection 2 weeks ago and took antibiotics.  She continues to have a headache.  Sinus pressure has improved.  Denies fever, nausea, vomiting.  Had diarrhea for about 3 days about a week ago.  She has not been working because of her symptoms.  COVID and flu test.  Patient has not had her influenza vaccine this year.  COVID vaccinated, boosted x1.    PERTINENT  PMH / PSH: reviewed and updated as appropriate   OBJECTIVE:   BP (!) 142/97   Pulse (!) 101   Wt 211 lb 9.6 oz (96 kg)   LMP 09/20/2015 (Exact Date)   SpO2 100%   BMI 39.98 kg/m    GEN:     alert, ill appearing female and no distress   HENT:  :  mucus membranes moist, oropharyngeal without lesions , no tonsillar enlargement, no erythema , mild inferior turbinate hypertrophy, nares patent, clear nasal discharge, bilateral TM normal EYES:   pupils equal and reactive, scleral injection NECK:  normal ROM, no lymphadenopathy, no nuchal rigidity RESP:  clear to auscultation bilaterally, no increased work of breathing  CVS:   regular rate and rhythm ABD:  soft, non-tender; bowel sounds present; no palpable masses  Skin:   warm and dry, no rash on visible skin, normal skin turgor    ASSESSMENT/PLAN:   Viral upper respiratory infection History consistent with viral illness. Overall pt is  well hydrated and without respiratory distress.  Has elevated temperature and is tachycardic.  Recently took ibuprofen and very well may be febrile.  Discussed symptomatic treatment. COVID and  influenza tests pending.  - continue to monitor for fevers  - continue Tylenol/ Motrin as needed for discomfort - nasal saline to help with his nasal congestion - Stressed hydration - Work note provided  - Discussed ED precautions, understanding voiced   Follow-up for hypertension deferred due to acute illness.  Schedule follow-up when she is feeling well.  Lyndee Hensen, DO PGY-3, [facility] 01/20/2021

## 2021-01-21 LAB — SARS-COV-2, NAA 2 DAY TAT

## 2021-01-21 LAB — NOVEL CORONAVIRUS, NAA: SARS-CoV-2, NAA: NOT DETECTED

## 2021-01-22 ENCOUNTER — Encounter: Payer: Self-pay | Admitting: Family Medicine

## 2021-01-25 NOTE — Assessment & Plan Note (Signed)
History consistent with viral illness. Overall pt is  well hydrated and without respiratory distress.  Has elevated temperature and is tachycardic.  Recently took ibuprofen and very well may be febrile.  Discussed symptomatic treatment. COVID and influenza tests pending.  - continue to monitor for fevers  - continue Tylenol/ Motrin as needed for discomfort - nasal saline to help with his nasal congestion - Stressed hydration - Work note provided  - Discussed ED precautions, understanding voiced

## 2021-02-27 ENCOUNTER — Telehealth (HOSPITAL_BASED_OUTPATIENT_CLINIC_OR_DEPARTMENT_OTHER): Payer: BC Managed Care – PPO | Admitting: Psychiatry

## 2021-02-27 ENCOUNTER — Other Ambulatory Visit: Payer: Self-pay

## 2021-02-27 DIAGNOSIS — F411 Generalized anxiety disorder: Secondary | ICD-10-CM

## 2021-02-27 DIAGNOSIS — F331 Major depressive disorder, recurrent, moderate: Secondary | ICD-10-CM | POA: Diagnosis not present

## 2021-02-27 DIAGNOSIS — F422 Mixed obsessional thoughts and acts: Secondary | ICD-10-CM | POA: Diagnosis not present

## 2021-02-27 DIAGNOSIS — F99 Mental disorder, not otherwise specified: Secondary | ICD-10-CM

## 2021-02-27 DIAGNOSIS — F5105 Insomnia due to other mental disorder: Secondary | ICD-10-CM

## 2021-02-27 MED ORDER — VENLAFAXINE HCL ER 75 MG PO CP24: 225.0000 mg | ORAL_CAPSULE | Freq: Every day | ORAL | 0 refills | Status: DC

## 2021-02-27 MED ORDER — QUETIAPINE FUMARATE 300 MG PO TABS: 300.0000 mg | ORAL_TABLET | Freq: Every day | ORAL | 0 refills | Status: DC

## 2021-02-27 NOTE — Progress Notes (Signed)
Virtual Visit via Telephone Note  I connected with Wendy Duffy on 02/27/21 at  3:00 PM EST by telephone and verified that I am speaking with the correct person using two identifiers. We were unable to connect by video due to errors.  Location: Patient: in car  Provider: office   I discussed the limitations, risks, security and privacy concerns of performing an evaluation and management service by telephone and the availability of in person appointments. I also discussed with the patient that there may be a patient responsible charge related to this service. The patient expressed understanding and agreed to proceed.   History of Present Illness: Overall Wendy Duffy is doing well. She is a little upset because she didn't get a job she wanted. Wendy Duffy reports that Effexor is working well but she is worried about her BP. She is currently on 3 blood pressure meds. Her depression is stable. She graduated from [employer] last week. Her home life is good. She is sleeping well with Seroquel. Her anxiety is mild and mostly situational. [name] denies SI/HI. She denies anhedonia, isolation and hopelessness. Wendy Duffy reports at the end of October she saw a snake at her front door. She now looks around where she saw it and opening her door. The ritual takes about 3-5 minutes and she does this every time she leaves her home. Wendy Duffy states it does not interfere with other activities or take any time while inside her home.    Observations/Objective:  General Appearance: unable to assess  Eye Contact:  unable to assess  Speech:  Clear and Coherent and Normal Rate  Volume:  Normal  Mood:  Euthymic  Affect:  Full Range  Thought Process:  Goal Directed, Linear, and Descriptions of Associations: Intact  Orientation:  Full (Time, Place, and Person)  Thought Content:  Logical  Suicidal Thoughts:  No  Homicidal Thoughts:  No  Memory:  Immediate;   Good  Judgement:  Good  Insight:  Good  Psychomotor Activity: unable to  assess  Concentration:  Concentration: Good  Recall:  Good  Fund of Knowledge:  Good  Language:  Good  Akathisia:  unable to assess  Handed:  unable to assess  AIMS (if indicated):     Assets:  Communication Skills Desire for Improvement Financial Resources/Insurance [place] Talents/Skills Transportation Vocational/Educational  ADL's:  unable to assess  Cognition:  WNL  Sleep:        Assessment and Plan:  Depression screen [facility] 2/9 02/27/2021 01/20/2021 12/12/2020 09/05/2020 07/11/2020  Decreased Interest 0 1 0 1 1  Down, Depressed, Hopeless 0 0 0 1 2  PHQ - 2 Score 0 1 0 2 3  Altered sleeping - 1 - 0 1  Tired, decreased energy - 2 - 0 0  Change in appetite - 0 - 0 3  Feeling bad or failure about yourself  - 0 - 1 3  Trouble concentrating - 0 - 0 0  Moving slowly or fidgety/restless - 0 - 0 0  Suicidal thoughts - 0 - 0 0  PHQ-9 Score - 4 - 3 10  Difficult doing work/chores - Not difficult at all - Somewhat difficult Somewhat difficult  Some recent data might be hidden    Flowsheet Row Video Visit from 02/27/2021 in [facility] Video Visit from 12/12/2020 in [place] [facility] Video Visit from 09/05/2020 in [place] No Risk No Risk No Risk  D/c Wellbutrin due to BP concerns. She is checking her BP at home once a week. Take Wellbutrin XL 150mg  for 3 days then 75mg  for 4 days then stop.   1. MDD (major depressive disorder), recurrent episode, moderate (HCC) - QUEtiapine (SEROQUEL) 300 MG tablet; Take 1 tablet (300 mg total) by mouth at bedtime.  Dispense: 90 tablet; Refill: 0 - venlafaxine XR (EFFEXOR-XR) 75 MG 24 hr capsule; Take 3 capsules (225 mg total) by mouth daily.  Dispense: 270 capsule; Refill: 0  2. GAD (generalized anxiety disorder) - QUEtiapine (SEROQUEL) 300 MG tablet; Take 1 tablet  (300 mg total) by mouth at bedtime.  Dispense: 90 tablet; Refill: 0 - venlafaxine XR (EFFEXOR-XR) 75 MG 24 hr capsule; Take 3 capsules (225 mg total) by mouth daily.  Dispense: 270 capsule; Refill: 0  3. Mixed obsessional thoughts and acts - QUEtiapine (SEROQUEL) 300 MG tablet; Take 1 tablet (300 mg total) by mouth at bedtime.  Dispense: 90 tablet; Refill: 0 - venlafaxine XR (EFFEXOR-XR) 75 MG 24 hr capsule; Take 3 capsules (225 mg total) by mouth daily.  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  Dispense: 90 tablet; Refill: 0   Follow Up Instructions: In 6-8 weeks or sooner if needed   I discussed the assessment and treatment plan with the patient. The patient was provided an opportunity to ask questions and all were answered. The patient agreed with the plan and demonstrated an understanding of the instructions.   The patient was advised to call back or seek an in-person evaluation if the symptoms worsen or if the condition fails to improve as anticipated.  I provided 13 minutes of non-face-to-face time during this encounter.   [name], MD

## 2021-03-24 ENCOUNTER — Other Ambulatory Visit: Payer: Self-pay | Admitting: Family Medicine

## 2021-04-10 ENCOUNTER — Telehealth (HOSPITAL_COMMUNITY): Payer: Self-pay | Admitting: Psychiatry

## 2021-04-10 ENCOUNTER — Telehealth (HOSPITAL_COMMUNITY): Payer: BC Managed Care – PPO | Admitting: Psychiatry

## 2021-04-10 ENCOUNTER — Other Ambulatory Visit: Payer: Self-pay

## 2021-04-10 NOTE — Telephone Encounter (Signed)
I called the patient at our scheduled appointment time. There was no answer. I left a voice message for patient to call the clinic back at their convinence.   

## 2021-05-01 ENCOUNTER — Other Ambulatory Visit: Payer: Self-pay | Admitting: Family Medicine

## 2021-06-05 ENCOUNTER — Other Ambulatory Visit: Payer: Self-pay | Admitting: Family Medicine

## 2021-06-05 DIAGNOSIS — I10 Essential (primary) hypertension: Secondary | ICD-10-CM

## 2021-06-30 ENCOUNTER — Other Ambulatory Visit (HOSPITAL_COMMUNITY): Payer: Self-pay | Admitting: Psychiatry

## 2021-06-30 DIAGNOSIS — F411 Generalized anxiety disorder: Secondary | ICD-10-CM

## 2021-06-30 DIAGNOSIS — F331 Major depressive disorder, recurrent, moderate: Secondary | ICD-10-CM

## 2021-06-30 DIAGNOSIS — F99 Mental disorder, not otherwise specified: Secondary | ICD-10-CM

## 2021-06-30 DIAGNOSIS — F422 Mixed obsessional thoughts and acts: Secondary | ICD-10-CM

## 2021-08-06 ENCOUNTER — Ambulatory Visit (INDEPENDENT_AMBULATORY_CARE_PROVIDER_SITE_OTHER): Payer: BC Managed Care – PPO

## 2021-08-06 ENCOUNTER — Ambulatory Visit (INDEPENDENT_AMBULATORY_CARE_PROVIDER_SITE_OTHER): Payer: BC Managed Care – PPO | Admitting: Family Medicine

## 2021-08-06 ENCOUNTER — Encounter: Payer: Self-pay | Admitting: Family Medicine

## 2021-08-06 VITALS — BP 113/82 | HR 89 | Ht 61.0 in | Wt 211.0 lb

## 2021-08-06 DIAGNOSIS — G8929 Other chronic pain: Secondary | ICD-10-CM | POA: Diagnosis not present

## 2021-08-06 DIAGNOSIS — I10 Essential (primary) hypertension: Secondary | ICD-10-CM

## 2021-08-06 DIAGNOSIS — Z23 Encounter for immunization: Secondary | ICD-10-CM

## 2021-08-06 DIAGNOSIS — M5441 Lumbago with sciatica, right side: Secondary | ICD-10-CM | POA: Diagnosis not present

## 2021-08-06 MED ORDER — BACLOFEN 5 MG PO TABS
10.0000 mg | ORAL_TABLET | Freq: Three times a day (TID) | ORAL | 2 refills | Status: DC
Start: 2021-08-06 — End: 2022-10-29

## 2021-08-06 MED ORDER — NAPROXEN 500 MG PO TABS: 500.0000 mg | ORAL_TABLET | Freq: Two times a day (BID) | ORAL | 1 refills | Status: DC

## 2021-08-06 NOTE — Patient Instructions (Addendum)
For your back pain:  Start the muscle relaxer at bedtime. Take Naprosyn twice a day as needed.  Consider physical therapy.   [facility]  In [facility]  [phone] Open ? Closes 5:30PM  [facility]  [phone] Open ? Closes 5PM  Your blood pressure is at your goal. Continue your medications.

## 2021-08-06 NOTE — Progress Notes (Signed)
   SUBJECTIVE:   CHIEF COMPLAINT / HPI:    Wendy Duffy is a 46 y.o. female here for back pain and hypertension follow up.    Back pain Continues to have intermittent low back pain that is worse on the right than left. Pain at times radiates to er right knee. She has stiffness in the morning.  Takes Alleve. Has not been taking muscle relaxer.  No recent trauma. No bowel or bladder incontinence. No extremity weakness.   HTN Denies missing doses of antihypertensive medications. Denies chest pain, palpitations, lower extremity edema, exertional dyspnea, lightheadedness, headaches and vision changes.        PERTINENT  PMH / PSH: reviewed and updated as appropriate   OBJECTIVE:   BP 113/82   Pulse 89   Ht '5\' 1"'$  (1.549 m)   Wt 211 lb (95.7 kg)   LMP 09/20/2015 (Exact Date)   SpO2 99%   BMI 39.87 kg/m    GEN: well appearing female in no acute distress  CVS:  regular rate and rhythm RESP: speaking in full sentences without pause, no respiratory distress, clear bilaterally  MSK: Lumbar spine: - Inspection: no gross deformity or asymmetry, swelling or ecchymosis. No skin changes - Palpation: No TTP over the spinous processes or SI joints b/l, +right sided paraspinal muscle tenderness  - ROM: full active ROM of the lumbar spine in flexion and extension  - Strength: 5/5 strength of lower extremity in L4-S1 nerve root distributions b/l - Neuro: sensation intact in the L4-S1 nerve root distribution b/l,  SKIN: warm and dry    ASSESSMENT/PLAN:   HYPERTENSION, BENIGN BP at goal.  Continue amlodipine 10 mg, valsartan 80 mg, HCTZ 25 mg. Obtain BMP and lipid panel.   Back pain Discussed with patient gradually returning to normal activities, as tolerated. Pt to continue ordinary activities within the limits permitted by pain. Will prescribe Naproxen sodium and Baclofen for pain relief.  Advised patient to avoid other NSAIDs while taking this medication. Counseled patient on red flag  symptoms and when to seek immediate care.  No red flags suggesting cauda equina syndrome or progressive major motor weakness. Patient to return if symptoms do not improve with conservative treatment.  ED precautions given. Obtain lumbar spine as no imaging available for review.       Lyndee Hensen, DO PGY-3, [facility] 08/10/2021

## 2021-08-07 LAB — BASIC METABOLIC PANEL
BUN/Creatinine Ratio: 14 (ref 9–23)
BUN: 12 mg/dL (ref 6–24)
CO2: 24 mmol/L (ref 20–29)
Calcium: 10 mg/dL (ref 8.7–10.2)
Chloride: 100 mmol/L (ref 96–106)
Creatinine, Ser: 0.83 mg/dL (ref 0.57–1.00)
Glucose: 88 mg/dL (ref 70–99)
Potassium: 3.9 mmol/L (ref 3.5–5.2)
Sodium: 139 mmol/L (ref 134–144)
eGFR: 89 mL/min/{1.73_m2} (ref >59)

## 2021-08-07 LAB — LIPID PANEL
Chol/HDL Ratio: 2.3 ratio (ref 0.0–4.4)
Cholesterol, Total: 190 mg/dL (ref 100–199)
HDL: 84 mg/dL (ref >39)
LDL Chol Calc (NIH): 92 mg/dL (ref 0–99)
Triglycerides: 78 mg/dL (ref 0–149)
VLDL Cholesterol Cal: 14 mg/dL (ref 5–40)

## 2021-08-10 ENCOUNTER — Encounter: Payer: Self-pay | Admitting: Family Medicine

## 2021-08-10 NOTE — Assessment & Plan Note (Signed)
Discussed with patient gradually returning to normal activities, as tolerated. Pt to continue ordinary activities within the limits permitted by pain. Will prescribe Naproxen sodium and Baclofen for pain relief.  Advised patient to avoid other NSAIDs while taking this medication. Counseled patient on red flag symptoms and when to seek immediate care.  No red flags suggesting cauda equina syndrome or progressive major motor weakness. Patient to return if symptoms do not improve with conservative treatment.  ED precautions given. Obtain lumbar spine as no imaging available for review.

## 2021-08-10 NOTE — Assessment & Plan Note (Addendum)
BP at goal.  Continue amlodipine 10 mg, valsartan 80 mg, HCTZ 25 mg. Obtain BMP and lipid panel.

## 2021-08-12 ENCOUNTER — Other Ambulatory Visit: Payer: Self-pay | Admitting: Family Medicine

## 2021-08-12 DIAGNOSIS — I10 Essential (primary) hypertension: Secondary | ICD-10-CM

## 2021-08-19 ENCOUNTER — Encounter: Payer: Self-pay | Admitting: *Deleted

## 2021-08-27 ENCOUNTER — Other Ambulatory Visit (HOSPITAL_COMMUNITY): Payer: Self-pay | Admitting: Psychiatry

## 2021-08-27 DIAGNOSIS — F411 Generalized anxiety disorder: Secondary | ICD-10-CM

## 2021-08-27 DIAGNOSIS — F331 Major depressive disorder, recurrent, moderate: Secondary | ICD-10-CM

## 2021-08-27 DIAGNOSIS — F99 Mental disorder, not otherwise specified: Secondary | ICD-10-CM

## 2021-08-27 DIAGNOSIS — F422 Mixed obsessional thoughts and acts: Secondary | ICD-10-CM

## 2021-08-28 ENCOUNTER — Telehealth (HOSPITAL_BASED_OUTPATIENT_CLINIC_OR_DEPARTMENT_OTHER): Payer: BC Managed Care – PPO | Admitting: Psychiatry

## 2021-08-28 DIAGNOSIS — F411 Generalized anxiety disorder: Secondary | ICD-10-CM

## 2021-08-28 DIAGNOSIS — F99 Mental disorder, not otherwise specified: Secondary | ICD-10-CM

## 2021-08-28 DIAGNOSIS — F422 Mixed obsessional thoughts and acts: Secondary | ICD-10-CM

## 2021-08-28 DIAGNOSIS — Z79899 Other long term (current) drug therapy: Secondary | ICD-10-CM

## 2021-08-28 DIAGNOSIS — F331 Major depressive disorder, recurrent, moderate: Secondary | ICD-10-CM | POA: Diagnosis not present

## 2021-08-28 DIAGNOSIS — F5105 Insomnia due to other mental disorder: Secondary | ICD-10-CM

## 2021-08-28 MED ORDER — VENLAFAXINE HCL ER 75 MG PO CP24
225.0000 mg | ORAL_CAPSULE | Freq: Every day | ORAL | 1 refills | Status: DC
Start: 2021-08-28 — End: 2022-02-19

## 2021-08-28 MED ORDER — QUETIAPINE FUMARATE 300 MG PO TABS: 300.0000 mg | ORAL_TABLET | Freq: Every day | ORAL | 1 refills | Status: DC

## 2021-08-28 NOTE — Progress Notes (Signed)
Virtual Visit via Video Note  I connected with Wendy Duffy on 08/28/21 at  9:00 AM EDT by a video enabled telemedicine application and verified that I am speaking with the correct person using two identifiers.  Location: Patient: in hotel Provider: office   I discussed the limitations of evaluation and management by telemedicine and the availability of in person appointments. The patient expressed understanding and agreed to proceed.  History of Present Illness: Wendy Duffy shares that her tonsils are swollen and nasal congestion for the last 1 week. She is currently in [place] with her husband and will be back in [place] this weekend. "I am really good". She has noticed any change in mood or energy without the Wellbutrin. She has been depressed and can't recall when she was last depressed. Wendy Duffy denies hopelessness, isolation, crying spells or anhedonia. She denies SI/HI. Wendy Duffy had some recent situational anxiety at work but denies any more anxiety. Her sleep is good. Her energy and appetite are good. She has rare obsessive thoughts about turning off the stove and she is usually able to overcome the desire to check.    Observations/Objective: Psychiatric Specialty Exam: ROS  Last menstrual period 09/20/2015.There is no height or weight on file to calculate BMI.  General Appearance: Casual and Neat  Eye Contact:  Good  Speech:  Clear and Coherent and Normal Rate  Volume:  Normal  Mood:  Euthymic  Affect:  Full Range  Thought Process:  Goal Directed, Linear, and Descriptions of Associations: Intact  Orientation:  Full (Time, Place, and Person)  Thought Content:  Logical  Suicidal Thoughts:  No  Homicidal Thoughts:  No  Memory:  Immediate;   Good  Judgement:  Good  Insight:  Good  Psychomotor Activity:  Normal  Concentration:  Concentration: Good  Recall:  Good  Fund of Knowledge:  Good  Language:  Good  Akathisia:  No  Handed:  Right  AIMS (if indicated):     Assets:   Communication Skills Desire for Improvement Financial Resources/Insurance Housing Intimacy Leisure Time Resilience Social Support Talents/Skills Transportation Vocational/Educational  ADL's:  Intact  Cognition:  WNL  Sleep:        Assessment and Plan:     08/28/2021    9:05 AM 02/27/2021    3:29 PM 01/20/2021    4:19 PM 12/12/2020    4:22 PM 09/05/2020   10:46 AM  Depression screen PHQ 2/9  Decreased Interest 0 0 1 0 1  Down, Depressed, Hopeless 0 0 0 0 1  PHQ - 2 Score 0 0 1 0 2  Altered sleeping   1  0  Tired, decreased energy   2  0  Change in appetite   0  0  Feeling bad or failure about yourself    0  1  Trouble concentrating   0  0  Moving slowly or fidgety/restless   0  0  Suicidal thoughts   0  0  PHQ-9 Score   4  3  Difficult doing work/chores   Not difficult at all  Somewhat difficult    Flowsheet Row Video Visit from 08/28/2021 in [facility] Video Visit from 02/27/2021 in [place] [facility] Video Visit from 12/12/2020 in [place] [facility]  C-SSRS RISK CATEGORY No Risk No Risk No Risk        Status of current problems: stable   Meds:  1. Encounter for long-term (current) use of medications - CBC -  TSH - Hemoglobin A1c - Prolactin  2. MDD (major depressive disorder), recurrent episode, moderate (HCC) - QUEtiapine (SEROQUEL) 300 MG tablet; Take 1 tablet (300 mg total) by mouth at bedtime.  Dispense: 90 tablet; Refill: 1 - venlafaxine XR (EFFEXOR-XR) 75 MG 24 hr capsule; Take 3 capsules (225 mg total) by mouth daily.  Dispense: 270 capsule; Refill: 1  3. GAD (generalized anxiety disorder) - QUEtiapine (SEROQUEL) 300 MG tablet; Take 1 tablet (300 mg total) by mouth at bedtime.  Dispense: 90 tablet; Refill: 1 - venlafaxine XR (EFFEXOR-XR) 75 MG 24 hr capsule; Take 3 capsules (225 mg total) by mouth daily.  Dispense: 270 capsule; Refill: 1  4.  Mixed obsessional thoughts and acts - QUEtiapine (SEROQUEL) 300 MG tablet; Take 1 tablet (300 mg total) by mouth at bedtime.  Dispense: 90 tablet; Refill: 1 - venlafaxine XR (EFFEXOR-XR) 75 MG 24 hr capsule; Take 3 capsules (225 mg total) by mouth daily.  Dispense: 270 capsule; Refill: 1  5. Insomnia due to other mental disorder - QUEtiapine (SEROQUEL) 300 MG tablet; Take 1 tablet (300 mg total) by mouth at bedtime.  Dispense: 90 tablet; Refill: 1     Labs: reviewed labs done 08/06/21 BMP and LIpid panel are WNL  Pt will schedule appointment to get EKG with PCP    Therapy: brief supportive therapy provided.    Collaboration of Care: Other none today  Patient/Guardian was advised Release of Information must be obtained prior to any record release in order to collaborate their care with an outside provider. Patient/Guardian was advised if they have not already done so to contact the registration department to sign all necessary forms in order for [place] to release information regarding their care.   Consent: Patient/Guardian gives verbal consent for treatment and assignment of benefits for services provided during this visit. Patient/Guardian expressed understanding and agreed to proceed.   Follow Up Instructions: Follow up in 5-6 months or sooner if needed    I discussed the assessment and treatment plan with the patient. The patient was provided an opportunity to ask questions and all were answered. The patient agreed with the plan and demonstrated an understanding of the instructions.   The patient was advised to call back or seek an in-person evaluation if the symptoms worsen or if the condition fails to improve as anticipated.  I provided 11 minutes of non-face-to-face time during this encounter.   [name], MD

## 2021-09-04 ENCOUNTER — Encounter: Payer: Self-pay | Admitting: Family Medicine

## 2021-12-05 ENCOUNTER — Other Ambulatory Visit: Payer: Self-pay

## 2021-12-05 DIAGNOSIS — I10 Essential (primary) hypertension: Secondary | ICD-10-CM

## 2021-12-05 MED ORDER — VALSARTAN 80 MG PO TABS: 80.0000 mg | ORAL_TABLET | Freq: Every day | ORAL | 1 refills | Status: DC

## 2022-01-12 ENCOUNTER — Other Ambulatory Visit: Payer: Self-pay

## 2022-01-12 MED ORDER — AMLODIPINE BESYLATE 10 MG PO TABS: 10.0000 mg | ORAL_TABLET | Freq: Every day | ORAL | 2 refills | Status: DC

## 2022-02-15 ENCOUNTER — Other Ambulatory Visit: Payer: Self-pay | Admitting: Student

## 2022-02-15 DIAGNOSIS — I10 Essential (primary) hypertension: Secondary | ICD-10-CM

## 2022-02-16 ENCOUNTER — Other Ambulatory Visit: Payer: Self-pay

## 2022-02-17 MED ORDER — HYDROCHLOROTHIAZIDE 25 MG PO TABS
25.0000 mg | ORAL_TABLET | Freq: Every day | ORAL | 3 refills | Status: DC
Start: 2022-02-17 — End: 2022-06-19

## 2022-02-19 ENCOUNTER — Telehealth (HOSPITAL_BASED_OUTPATIENT_CLINIC_OR_DEPARTMENT_OTHER): Payer: BC Managed Care – PPO | Admitting: Psychiatry

## 2022-02-19 DIAGNOSIS — F5105 Insomnia due to other mental disorder: Secondary | ICD-10-CM | POA: Diagnosis not present

## 2022-02-19 DIAGNOSIS — Z79899 Other long term (current) drug therapy: Secondary | ICD-10-CM

## 2022-02-19 DIAGNOSIS — F411 Generalized anxiety disorder: Secondary | ICD-10-CM

## 2022-02-19 DIAGNOSIS — F422 Mixed obsessional thoughts and acts: Secondary | ICD-10-CM | POA: Diagnosis not present

## 2022-02-19 DIAGNOSIS — F331 Major depressive disorder, recurrent, moderate: Secondary | ICD-10-CM

## 2022-02-19 MED ORDER — VENLAFAXINE HCL ER 75 MG PO CP24: 225.0000 mg | ORAL_CAPSULE | Freq: Every day | ORAL | 1 refills | Status: DC

## 2022-02-19 MED ORDER — QUETIAPINE FUMARATE 300 MG PO TABS: 300.0000 mg | ORAL_TABLET | Freq: Every day | ORAL | 1 refills | Status: DC

## 2022-02-19 NOTE — Progress Notes (Signed)
Virtual Visit via Video Note  I connected with Wendy Duffy on 02/19/22 at  4:00 PM EST by  a video enabled telemedicine application and verified that I am speaking with the correct person using two identifiers.  Location: Patient: in parked car Provider: office   I discussed the limitations of evaluation and management by telemedicine and the availability of in person appointments. The patient expressed understanding and agreed to proceed.  History of Present Illness: Wendy Duffy is doing well. She had a good Thanksgiving at her son's house. She is looking forward to Christmas with her family. [name] denies depression. She went 1 week without her Effexor and got very irritable at the beginning of November. It resolved after restarting it. She denies hopelessness, isolation and anhedonia. She denies SI/HI. Her sleep is good with Seroquel. Her energy is good. Anxiety is something she struggles with randomly. She will feel nervous and restless randomly once/week. She is not really checking things over and over again, as long as she tells herself she checked it once while doing so.    Observations/Objective: Psychiatric Specialty Exam: ROS  Last menstrual period 09/20/2015.There is no height or weight on file to calculate BMI.  General Appearance: Casual and Neat  Eye Contact:  Good  Speech:  Clear and Coherent and Normal Rate  Volume:  Normal  Mood:  Euthymic  Affect:  Full Range  Thought Process:  Goal Directed, Linear, and Descriptions of Associations: Intact  Orientation:  Full (Time, Place, and Person)  Thought Content:  Logical  Suicidal Thoughts:  No  Homicidal Thoughts:  No  Memory:  Immediate;   Good  Judgement:  Good  Insight:  Good  Psychomotor Activity:  Normal  Concentration:  Concentration: Good  Recall:  Good  Fund of Knowledge:  Good  Language:  Good  Akathisia:  No  Handed:  Right  AIMS (if indicated):     Assets:  Communication Skills Desire for  Improvement Financial Resources/Insurance Housing Intimacy Leisure Time Resilience Social Support Talents/Skills Transportation Vocational/Educational  ADL's:  Intact  Cognition:  WNL  Sleep:        Assessment and Plan:     02/19/2022    4:18 PM 02/19/2022    4:14 PM 08/28/2021    9:05 AM 02/27/2021    3:29 PM 01/20/2021    4:19 PM  Depression screen PHQ 2/9  Decreased Interest 0 0 0 0 1  Down, Depressed, Hopeless 0 0 0 0 0  PHQ - 2 Score 0 0 0 0 1  Altered sleeping     1  Tired, decreased energy     2  Change in appetite     0  Feeling bad or failure about yourself      0  Trouble concentrating     0  Moving slowly or fidgety/restless     0  Suicidal thoughts     0  PHQ-9 Score     4  Difficult doing work/chores     Not difficult at all    Flowsheet Row Video Visit from 02/19/2022 in [facility] [facility] Video Visit from 08/28/2021 in [place] [facility] Video Visit from 02/27/2021 in [place] No Risk No Risk No Risk          Pt is aware that these meds carry a teratogenic risk. Pt will discuss plan of action if she does or plans to become pregnant in the future.  Status of current problems: stable  Meds:  1. GAD (generalized anxiety disorder) - QUEtiapine (SEROQUEL) 300 MG tablet; Take 1 tablet (300 mg total) by mouth at bedtime.  Dispense: 90 tablet; Refill: 1 - venlafaxine XR (EFFEXOR-XR) 75 MG 24 hr capsule; Take 3 capsules (225 mg total) by mouth daily.  Dispense: 270 capsule; Refill: 1  2. MDD (major depressive disorder), recurrent episode, moderate (HCC) - QUEtiapine (SEROQUEL) 300 MG tablet; Take 1 tablet (300 mg total) by mouth at bedtime.  Dispense: 90 tablet; Refill: 1 - venlafaxine XR (EFFEXOR-XR) 75 MG 24 hr capsule; Take 3 capsules (225 mg total) by mouth daily.  Dispense: 270 capsule; Refill: 1  3. Mixed obsessional  thoughts and acts - QUEtiapine (SEROQUEL) 300 MG tablet; Take 1 tablet (300 mg total) by mouth at bedtime.  Dispense: 90 tablet; Refill: 1 - venlafaxine XR (EFFEXOR-XR) 75 MG 24 hr capsule; Take 3 capsules (225 mg total) by mouth daily.  Dispense: 270 capsule; Refill: 1  4. Insomnia due to other mental disorder - QUEtiapine (SEROQUEL) 300 MG tablet; Take 1 tablet (300 mg total) by mouth at bedtime.  Dispense: 90 tablet; Refill: 1  5. Encounter for long-term (current) use of medications - CBC with Differential - Comprehensive Metabolic Panel (CMET) - Prolactin - TSH - HgB A1c        Therapy: brief supportive therapy provided. Discussed psychosocial stressors in detail.    Collaboration of Care: Other none  Patient/Guardian was advised Release of Information must be obtained prior to any record release in order to collaborate their care with an outside provider. Patient/Guardian was advised if they have not already done so to contact the registration department to sign all necessary forms in order for [place] to release information regarding their care.   Consent: Patient/Guardian gives verbal consent for treatment and assignment of benefits for services provided during this visit. Patient/Guardian expressed understanding and agreed to proceed.       Follow Up Instructions: Follow up in 6 months or sooner if needed    I discussed the assessment and treatment plan with the patient. The patient was provided an opportunity to ask questions and all were answered. The patient agreed with the plan and demonstrated an understanding of the instructions.   The patient was advised to call back or seek an in-person evaluation if the symptoms worsen or if the condition fails to improve as anticipated.  I provided 11 minutes of non-face-to-face time during this encounter.   [name], MD

## 2022-02-24 ENCOUNTER — Other Ambulatory Visit: Payer: Self-pay | Admitting: Family Medicine

## 2022-02-24 DIAGNOSIS — Z1231 Encounter for screening mammogram for malignant neoplasm of breast: Secondary | ICD-10-CM

## 2022-02-25 ENCOUNTER — Other Ambulatory Visit (HOSPITAL_COMMUNITY): Payer: Self-pay | Admitting: Psychiatry

## 2022-02-25 DIAGNOSIS — F331 Major depressive disorder, recurrent, moderate: Secondary | ICD-10-CM

## 2022-02-25 DIAGNOSIS — F5105 Insomnia due to other mental disorder: Secondary | ICD-10-CM

## 2022-02-25 DIAGNOSIS — F422 Mixed obsessional thoughts and acts: Secondary | ICD-10-CM

## 2022-02-25 DIAGNOSIS — F411 Generalized anxiety disorder: Secondary | ICD-10-CM

## 2022-03-20 ENCOUNTER — Ambulatory Visit: Payer: BC Managed Care – PPO | Admitting: Student

## 2022-03-20 ENCOUNTER — Encounter: Payer: Self-pay | Admitting: Student

## 2022-03-20 VITALS — BP 118/72 | HR 97 | Ht 61.0 in | Wt 218.0 lb

## 2022-03-20 DIAGNOSIS — J101 Influenza due to other identified influenza virus with other respiratory manifestations: Secondary | ICD-10-CM | POA: Diagnosis not present

## 2022-03-20 DIAGNOSIS — R051 Acute cough: Secondary | ICD-10-CM

## 2022-03-20 MED ORDER — PROMETHAZINE-DM 6.25-15 MG/5ML PO SYRP: 5.0000 mL | ORAL_SOLUTION | Freq: Four times a day (QID) | ORAL | 0 refills | Status: DC | PRN

## 2022-03-20 NOTE — Patient Instructions (Addendum)
It was great seeing you today.  Take 500 mg Tylenol and 400 mg Ibuprofen every 6-8 hours for pain/fever.  Drink plenty of fluids to keep hydrated and make your secretions thinner.  Take the cough medicine every 6 hours as needed.  We collected testing today for COVID and flu- I will call you if it is abnormal. If your results are normal, I will send you a MyChart message.   If you have any questions or concerns, please feel free to call the clinic.   Have a wonderful day,  Dr. Orvis Brill [facility] [phone]

## 2022-03-20 NOTE — Progress Notes (Signed)
    SUBJECTIVE:   CHIEF COMPLAINT / HPI:   Wendy Duffy is a 47 year-old female here with productive cough, subjective fever, diffuse bodyaches since 2 days ago that came on suddenly.  She says she has not been sleeping well due to the cough.  She denies any vomiting, nausea or diarrhea.  She has been able to eat and drink as normal. She is wondering if she caught something while she was in [place] around large crowds.  PERTINENT  PMH / PSH: Reviewed  OBJECTIVE:   BP 118/72   Pulse 97   Ht 5' 1 (1.549 m)   Wt 218 lb (98.9 kg)   LMP 09/20/2015 (Exact Date)   SpO2 94%   BMI 41.19 kg/m   General: Tired appearing, but nontoxic  Cardio: Normal S1 and S2, no S3 or S4. Rhythm is regular. No murmurs or rubs.   Pulm: Clear to auscultation bilaterally, no crackles, wheezing, or diminished breath sounds. Normal respiratory effort Abdomen: Bowel sounds normal. Abdomen soft and non-tender.  Extremities: No peripheral edema. Warm/ well perfused.  Strong radial pulses. Neuro: Cranial nerves grossly intact   ASSESSMENT/PLAN:   Influenza A Patient with 2 days of URI symptoms. Tired-appearing, but VSS and in no distress. Influenza A PCR positive.  Continue supportive management.  Discussed Tamiflu. Work note provided. Return precautions discussed should her symptoms worsen or she develops shortness of breath. Provided promethazine  to be taken as needed every 8 hours for nausea.     Burris Matherne, DO [facility]

## 2022-03-22 LAB — COVID-19, FLU A+B NAA
Influenza A, NAA: DETECTED — AB
Influenza B, NAA: NOT DETECTED
SARS-CoV-2, NAA: NOT DETECTED

## 2022-03-23 ENCOUNTER — Encounter: Payer: Self-pay | Admitting: Student

## 2022-03-23 DIAGNOSIS — J101 Influenza due to other identified influenza virus with other respiratory manifestations: Secondary | ICD-10-CM | POA: Insufficient documentation

## 2022-03-23 NOTE — Telephone Encounter (Signed)
Patient calls nurse line requesting extended work note. She is having continued body aches, cough and congestion. Denies fever.   Will forward to Dr. Owens Shark.   Talbot Grumbling, RN

## 2022-03-23 NOTE — Assessment & Plan Note (Signed)
Patient with 2 days of URI symptoms. Tired-appearing, but VSS and in no distress. Influenza A PCR positive.  Continue supportive management.  Discussed Tamiflu. Work note provided. Return precautions discussed should her symptoms worsen or she develops shortness of breath. Provided promethazine to be taken as needed every 8 hours for nausea.

## 2022-03-25 ENCOUNTER — Telehealth: Payer: Self-pay | Admitting: Student

## 2022-03-25 NOTE — Telephone Encounter (Signed)
Letter provided for work excuse.

## 2022-04-20 ENCOUNTER — Ambulatory Visit
Admission: RE | Admit: 2022-04-20 | Discharge: 2022-04-20 | Disposition: A | Discharge status: Home / Self Care | Payer: BC Managed Care – PPO | Source: Ambulatory Visit | Attending: Family Medicine | Admitting: Family Medicine

## 2022-04-20 DIAGNOSIS — Z1231 Encounter for screening mammogram for malignant neoplasm of breast: Secondary | ICD-10-CM

## 2022-04-23 ENCOUNTER — Other Ambulatory Visit: Payer: Self-pay | Admitting: Family Medicine

## 2022-04-23 DIAGNOSIS — R928 Other abnormal and inconclusive findings on diagnostic imaging of breast: Secondary | ICD-10-CM

## 2022-04-27 ENCOUNTER — Other Ambulatory Visit: Payer: Self-pay | Admitting: Student

## 2022-04-27 DIAGNOSIS — R928 Other abnormal and inconclusive findings on diagnostic imaging of breast: Secondary | ICD-10-CM

## 2022-04-28 ENCOUNTER — Other Ambulatory Visit: Payer: Self-pay | Admitting: Student

## 2022-04-28 DIAGNOSIS — I10 Essential (primary) hypertension: Secondary | ICD-10-CM

## 2022-04-29 ENCOUNTER — Other Ambulatory Visit: Payer: Self-pay | Admitting: Student

## 2022-04-29 DIAGNOSIS — R928 Other abnormal and inconclusive findings on diagnostic imaging of breast: Secondary | ICD-10-CM

## 2022-05-04 ENCOUNTER — Ambulatory Visit
Admission: RE | Admit: 2022-05-04 | Discharge: 2022-05-04 | Disposition: A | Discharge status: Home / Self Care | Payer: BC Managed Care – PPO | Source: Ambulatory Visit | Attending: Family Medicine | Admitting: Family Medicine

## 2022-05-04 ENCOUNTER — Other Ambulatory Visit: Payer: Self-pay | Admitting: Student

## 2022-05-04 DIAGNOSIS — N6320 Unspecified lump in the left breast, unspecified quadrant: Secondary | ICD-10-CM

## 2022-05-04 DIAGNOSIS — R928 Other abnormal and inconclusive findings on diagnostic imaging of breast: Secondary | ICD-10-CM

## 2022-05-11 ENCOUNTER — Ambulatory Visit (INDEPENDENT_AMBULATORY_CARE_PROVIDER_SITE_OTHER): Payer: BC Managed Care – PPO | Admitting: Student

## 2022-05-11 ENCOUNTER — Other Ambulatory Visit: Payer: Self-pay | Admitting: Student

## 2022-05-11 ENCOUNTER — Encounter: Payer: Self-pay | Admitting: Student

## 2022-05-11 VITALS — BP 112/78 | HR 97 | Wt 222.8 lb

## 2022-05-11 DIAGNOSIS — R928 Other abnormal and inconclusive findings on diagnostic imaging of breast: Secondary | ICD-10-CM

## 2022-05-11 NOTE — Progress Notes (Signed)
  SUBJECTIVE:   CHIEF COMPLAINT / HPI:   Follow up MM results:  No fever or chills, weight loss  Does sweat at night.  Does not feel a nodule in the breast, had no symptoms prior to routine screening  Three times a week, she experiences dull over the left chest wall (specific muscular in nature and not true chest pain) and radiates to the left axillary region Lasts until she falls asleep Has some dyspnea walking longer distances  Has taken tylenol for pain relief before  Otherwise, she reports anxious mood regarding MM + U/S results and would like a biopsy now to ensure no malignancy in her masses noticed on imaging   U/S + MM: Probably benign circumscribed masses in the UPPER portion of the LEFT breast seen mammographically. Probably benign oval circumscribed hypoechoic mass is seen in the 12 o'clock and 2 o'clock locations of the LEFT breast, possible correlate for the mammographic findings.  RECOMMENDATION: Recommend LEFT diagnostic mammogram and LEFT breast ultrasound in 6 months to assess stability of these findings.  PERTINENT  PMH / PSH:   Anemia, anxiety, depression   OBJECTIVE:  BP 112/78   Pulse 97   Wt 222 lb 12.8 oz (101.1 kg)   LMP 09/20/2015 (Exact Date)   SpO2 97%   BMI 42.10 kg/m  Physical Exam  General: Alert and oriented in no apparent distress Heart: Regular rate and rhythm with no murmurs appreciated Lungs: Normal WOB Abdomen: no abdominal pain Skin: Warm and dry Breast exam: no induration, erythema, swelling, discharge left breast. Normal axillary and breast exam. Chaperoned by CMA    ASSESSMENT/PLAN:  Abnormal mammogram of left breast Assessment & Plan: See U/S and MM above. Patient has anxiety regarding her findings and would like a biopsy, for which she says she has discussed with [facility]. They instructed the patient to be see her PCP for an order for a core biopsy. I understand the need for peace of mind for masses that meet BIRADS  3. We discussed the likelihood of insurance coverage. Spoke to [place], willing to perform biopsy if added on to orders, gave verbal order to [facility]. No red flags symptoms.     Return if symptoms worsen or fail to improve. Erskine Emery, MD 05/11/2022, 3:38 PM PGY-2, [place]

## 2022-05-11 NOTE — Assessment & Plan Note (Addendum)
See U/S and MM above. Patient has anxiety regarding her findings and would like a biopsy, for which she says she has discussed with [facility]. They instructed the patient to be see her PCP for an order for a core biopsy. I understand the need for peace of mind for masses that meet BIRADS 3. We discussed the likelihood of insurance coverage. Spoke to [place], willing to perform biopsy if added on to orders, gave verbal order to [facility]. No red flags symptoms.

## 2022-05-11 NOTE — Patient Instructions (Addendum)
It was great to see you today! Thank you for choosing [facility] for your primary care. Wendy Duffy was seen for follow up.  Today we addressed:  Please call to discuss  [facility] [phone]; [phone]  If you are unable to reach the Breast center, please let me know   If you haven't already, sign up for My Chart to have easy access to your labs results, and communication with your primary care physician.  I recommend that you always bring your medications to each appointment as this makes it easy to ensure you are on the correct medications and helps [place] not miss refills when you need them. Call the clinic at [phone] if your symptoms worsen or you have any concerns.  You should return to our clinic Return if symptoms worsen or fail to improve. Please arrive 15 minutes before your appointment to ensure smooth check in process.  We appreciate your efforts in making this happen.  Thank you for allowing me to participate in your care, Erskine Emery, MD 05/11/2022, 2:12 PM PGY-2, [place]

## 2022-05-12 ENCOUNTER — Encounter: Payer: Self-pay | Admitting: Student

## 2022-05-14 ENCOUNTER — Other Ambulatory Visit: Payer: Self-pay | Admitting: Student

## 2022-05-15 ENCOUNTER — Other Ambulatory Visit: Payer: Self-pay | Admitting: Student

## 2022-05-15 DIAGNOSIS — R928 Other abnormal and inconclusive findings on diagnostic imaging of breast: Secondary | ICD-10-CM

## 2022-05-20 ENCOUNTER — Ambulatory Visit
Admission: RE | Admit: 2022-05-20 | Discharge: 2022-05-20 | Disposition: A | Discharge status: Home / Self Care | Payer: BC Managed Care – PPO | Source: Ambulatory Visit | Attending: Family Medicine | Admitting: Family Medicine

## 2022-05-20 ENCOUNTER — Other Ambulatory Visit: Payer: Self-pay | Admitting: Student

## 2022-05-20 DIAGNOSIS — R928 Other abnormal and inconclusive findings on diagnostic imaging of breast: Secondary | ICD-10-CM

## 2022-05-20 HISTORY — PX: BREAST BIOPSY: SHX20

## 2022-06-19 ENCOUNTER — Other Ambulatory Visit: Payer: Self-pay | Admitting: Student

## 2022-07-13 ENCOUNTER — Other Ambulatory Visit: Payer: Self-pay | Admitting: Student

## 2022-07-13 DIAGNOSIS — I10 Essential (primary) hypertension: Secondary | ICD-10-CM

## 2022-07-20 ENCOUNTER — Encounter (HOSPITAL_COMMUNITY): Payer: Self-pay

## 2022-08-13 ENCOUNTER — Telehealth (HOSPITAL_COMMUNITY): Payer: Self-pay | Admitting: Psychiatry

## 2022-08-15 ENCOUNTER — Other Ambulatory Visit: Payer: Self-pay | Admitting: Student

## 2022-08-17 NOTE — Telephone Encounter (Signed)
Needs BMP before next refill  

## 2022-08-20 ENCOUNTER — Telehealth (HOSPITAL_COMMUNITY): Payer: BC Managed Care – PPO | Admitting: Psychiatry

## 2022-08-20 ENCOUNTER — Telehealth (HOSPITAL_COMMUNITY): Payer: Self-pay | Admitting: Psychiatry

## 2022-08-20 MED ORDER — QUETIAPINE FUMARATE 300 MG PO TABS: 300.0000 mg | ORAL_TABLET | Freq: Every day | ORAL | 1 refills | Status: DC

## 2022-08-20 MED ORDER — VENLAFAXINE HCL ER 75 MG PO CP24: 225.0000 mg | ORAL_CAPSULE | Freq: Every day | ORAL | 0 refills | Status: DC

## 2022-08-20 NOTE — Telephone Encounter (Signed)
We were unable to connect by video despite multiple attempts. We were not able to see or hear each other. I called her and informed her that insurance would not cover phone visits. We opted to cancel today's visit. I also shared that I would be leaving [facility] and my last day was 09/22/22. We opted to  rescheduled to see Dr. Lolly Mustache on 08/26/22. I will bridge her Seroquel and Effexor till then.

## 2022-08-26 ENCOUNTER — Telehealth (HOSPITAL_BASED_OUTPATIENT_CLINIC_OR_DEPARTMENT_OTHER): Payer: BC Managed Care – PPO | Admitting: Psychiatry

## 2022-08-26 ENCOUNTER — Other Ambulatory Visit (HOSPITAL_COMMUNITY): Payer: Self-pay

## 2022-08-26 ENCOUNTER — Encounter (HOSPITAL_COMMUNITY): Payer: Self-pay | Admitting: Psychiatry

## 2022-08-26 VITALS — Wt 225.0 lb

## 2022-08-26 DIAGNOSIS — Z79899 Other long term (current) drug therapy: Secondary | ICD-10-CM

## 2022-08-26 DIAGNOSIS — F5105 Insomnia due to other mental disorder: Secondary | ICD-10-CM

## 2022-08-26 DIAGNOSIS — F331 Major depressive disorder, recurrent, moderate: Secondary | ICD-10-CM

## 2022-08-26 DIAGNOSIS — F4310 Post-traumatic stress disorder, unspecified: Secondary | ICD-10-CM

## 2022-08-26 DIAGNOSIS — F411 Generalized anxiety disorder: Secondary | ICD-10-CM | POA: Diagnosis not present

## 2022-08-26 MED ORDER — VENLAFAXINE HCL ER 75 MG PO CP24: 225.0000 mg | ORAL_CAPSULE | Freq: Every day | ORAL | 2 refills | Status: DC

## 2022-08-26 MED ORDER — QUETIAPINE FUMARATE 300 MG PO TABS: 300.0000 mg | ORAL_TABLET | Freq: Every day | ORAL | 2 refills | Status: DC
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
## 2022-08-28 ENCOUNTER — Other Ambulatory Visit (HOSPITAL_COMMUNITY): Payer: Self-pay | Admitting: Psychiatry

## 2022-08-28 ENCOUNTER — Ambulatory Visit (HOSPITAL_COMMUNITY): Payer: BC Managed Care – PPO

## 2022-08-29 LAB — CMP12+TSH
AST: 15 IU/L (ref 0–40)
Albumin: 4.2 g/dL (ref 3.9–4.9)
Alkaline Phosphatase: 75 IU/L (ref 44–121)
BUN/Creatinine Ratio: 15 (ref 9–23)
BUN: 14 mg/dL (ref 6–24)
Bilirubin Total: 0.2 mg/dL (ref 0.0–1.2)
Calcium: 9.7 mg/dL (ref 8.7–10.2)
Chloride: 103 mmol/L (ref 96–106)
Creatinine, Ser: 0.93 mg/dL (ref 0.57–1.00)
Globulin, Total: 2.6 g/dL (ref 1.5–4.5)
Glucose: 96 mg/dL (ref 70–99)
Potassium: 4.4 mmol/L (ref 3.5–5.2)
Sodium: 139 mmol/L (ref 134–144)
TSH: 0.875 u[IU]/mL (ref 0.450–4.500)
Total Protein: 6.8 g/dL (ref 6.0–8.5)
eGFR: 77 mL/min/{1.73_m2} (ref >59)

## 2022-08-29 LAB — CBC WITH DIFFERENTIAL/PLATELET
Basophils Absolute: 0 10*3/uL (ref 0.0–0.2)
Basos: 1 %
EOS (ABSOLUTE): 0.4 10*3/uL (ref 0.0–0.4)
Eos: 9 %
Hematocrit: 41.7 % (ref 34.0–46.6)
Hemoglobin: 13.7 g/dL (ref 11.1–15.9)
Immature Grans (Abs): 0 10*3/uL (ref 0.0–0.1)
Immature Granulocytes: 0 %
Lymphocytes Absolute: 1.6 10*3/uL (ref 0.7–3.1)
Lymphs: 34 %
MCH: 30 pg (ref 26.6–33.0)
MCHC: 32.9 g/dL (ref 31.5–35.7)
MCV: 91 fL (ref 79–97)
Monocytes Absolute: 0.4 10*3/uL (ref 0.1–0.9)
Monocytes: 8 %
Neutrophils Absolute: 2.2 10*3/uL (ref 1.4–7.0)
Neutrophils: 48 %
Platelets: 319 10*3/uL (ref 150–450)
RBC: 4.56 x10E6/uL (ref 3.77–5.28)
RDW: 12.9 % (ref 11.7–15.4)
WBC: 4.7 10*3/uL (ref 3.4–10.8)

## 2022-08-29 LAB — HGB A1C W/O EAG: Hgb A1c MFr Bld: 5 % (ref 4.8–5.6)

## 2022-09-19 ENCOUNTER — Other Ambulatory Visit: Payer: Self-pay | Admitting: Student

## 2022-09-19 DIAGNOSIS — I10 Essential (primary) hypertension: Secondary | ICD-10-CM

## 2022-09-25 ENCOUNTER — Encounter: Payer: BC Managed Care – PPO | Admitting: Student

## 2022-10-08 ENCOUNTER — Other Ambulatory Visit: Payer: Self-pay | Admitting: Student

## 2022-10-09 ENCOUNTER — Encounter: Payer: BC Managed Care – PPO | Admitting: Student
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
## 2022-10-19 ENCOUNTER — Telehealth (HOSPITAL_COMMUNITY): Payer: Self-pay | Admitting: Psychiatry

## 2022-10-29 ENCOUNTER — Encounter: Payer: Self-pay | Admitting: Student

## 2022-10-29 ENCOUNTER — Ambulatory Visit: Payer: BC Managed Care – PPO | Admitting: Student

## 2022-10-29 VITALS — BP 106/87 | HR 100 | Ht 61.0 in | Wt 224.2 lb

## 2022-10-29 DIAGNOSIS — D649 Anemia, unspecified: Secondary | ICD-10-CM | POA: Diagnosis not present

## 2022-10-29 DIAGNOSIS — R928 Other abnormal and inconclusive findings on diagnostic imaging of breast: Secondary | ICD-10-CM | POA: Diagnosis not present

## 2022-10-29 DIAGNOSIS — Z1211 Encounter for screening for malignant neoplasm of colon: Secondary | ICD-10-CM

## 2022-10-29 DIAGNOSIS — I517 Cardiomegaly: Secondary | ICD-10-CM | POA: Diagnosis not present

## 2022-10-29 DIAGNOSIS — I10 Essential (primary) hypertension: Secondary | ICD-10-CM

## 2022-10-29 DIAGNOSIS — Z0000 Encounter for general adult medical examination without abnormal findings: Secondary | ICD-10-CM
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
## 2022-10-29 NOTE — Assessment & Plan Note (Signed)
Recommend LEFT diagnostic mammogram and LEFT breast ultrasound in 6 months to assess stability of these findings.

## 2022-10-29 NOTE — Patient Instructions (Addendum)
It was great to see you today! Thank you for choosing [facility] for your primary care.  Today we addressed: Schedule your colonoscopy to help detect colon cancer. The stomach doctors will call to schedule an appt with you. If you decide against this, please ask about the cologuard as an option.    You would need another breast screening  Please return in a few months for recheck   If you haven't already, sign up for My Chart to have easy access to your labs results, and communication with your primary care physician. I recommend that you always bring your medications to each appointment as this makes it easy to ensure you are on the correct medications and helps [place] not miss refills when you need them. Call the clinic at [phone] if your symptoms worsen or you have any concerns.  Return in about 3 months (around 01/29/2023).  Please arrive 15 minutes before your appointment to ensure smooth check in process.  We appreciate your efforts in making this happen.  Thank you for allowing me to participate in your care, Alfredo Martinez, MD 10/29/2022, 4:24 PM PGY-3, [facility]

## 2022-10-29 NOTE — Assessment & Plan Note (Signed)
See Below:

## 2022-10-29 NOTE — Assessment & Plan Note (Signed)
amlodipine 10 mg, valsartan 80 mg, HCTZ 25 mg  BMP UTD

## 2022-11-02 ENCOUNTER — Other Ambulatory Visit: Payer: BC Managed Care – PPO

## 2022-11-25 ENCOUNTER — Telehealth (HOSPITAL_COMMUNITY): Payer: BC Managed Care – PPO | Admitting: Psychiatry

## 2022-11-25 ENCOUNTER — Encounter (HOSPITAL_COMMUNITY): Payer: Self-pay | Admitting: Psychiatry

## 2022-11-25 ENCOUNTER — Telehealth (HOSPITAL_BASED_OUTPATIENT_CLINIC_OR_DEPARTMENT_OTHER): Payer: BC Managed Care – PPO | Admitting: Psychiatry

## 2022-11-25 VITALS — Wt 224.0 lb

## 2022-11-25 DIAGNOSIS — F5105 Insomnia due to other mental disorder: Secondary | ICD-10-CM

## 2022-11-25 DIAGNOSIS — F331 Major depressive disorder, recurrent, moderate: Secondary | ICD-10-CM

## 2022-11-25 DIAGNOSIS — F411 Generalized anxiety disorder: Secondary | ICD-10-CM

## 2022-11-25 DIAGNOSIS — F4310 Post-traumatic stress disorder, unspecified: Secondary | ICD-10-CM

## 2022-11-25 MED ORDER — QUETIAPINE FUMARATE 300 MG PO TABS: 300.0000 mg | ORAL_TABLET | Freq: Every day | ORAL | 2 refills | Status: DC

## 2022-11-25 MED ORDER — VENLAFAXINE HCL ER 75 MG PO CP24: 225.0000 mg | ORAL_CAPSULE | Freq: Every day | ORAL | 2 refills | Status: DC
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
## 2022-12-06 ENCOUNTER — Other Ambulatory Visit: Payer: Self-pay | Admitting: Student

## 2022-12-06 DIAGNOSIS — I10 Essential (primary) hypertension: Secondary | ICD-10-CM

## 2022-12-11 ENCOUNTER — Other Ambulatory Visit: Payer: BC Managed Care – PPO

## 2022-12-21 ENCOUNTER — Ambulatory Visit
Admission: RE | Admit: 2022-12-21 | Discharge: 2022-12-21 | Disposition: A | Discharge status: Home / Self Care | Payer: BC Managed Care – PPO | Source: Ambulatory Visit | Attending: Family Medicine | Admitting: Family Medicine

## 2022-12-21 DIAGNOSIS — R928 Other abnormal and inconclusive findings on diagnostic imaging of breast: Secondary | ICD-10-CM

## 2022-12-21 DIAGNOSIS — N6320 Unspecified lump in the left breast, unspecified quadrant: Secondary | ICD-10-CM

## 2022-12-22 ENCOUNTER — Other Ambulatory Visit: Payer: Self-pay | Admitting: Family Medicine

## 2022-12-22 DIAGNOSIS — N6320 Unspecified lump in the left breast, unspecified quadrant: Secondary | ICD-10-CM

## 2023-01-05 ENCOUNTER — Other Ambulatory Visit: Payer: Self-pay | Admitting: Student

## 2023-02-20 ENCOUNTER — Other Ambulatory Visit: Payer: Self-pay | Admitting: Student

## 2023-03-02 ENCOUNTER — Ambulatory Visit: Payer: BC Managed Care – PPO | Admitting: Student

## 2023-03-05 ENCOUNTER — Ambulatory Visit: Payer: BC Managed Care – PPO | Admitting: Family Medicine

## 2023-03-05 ENCOUNTER — Encounter: Payer: Self-pay | Admitting: Family Medicine

## 2023-03-05 VITALS — BP 111/78 | HR 103 | Ht 61.0 in | Wt 221.8 lb

## 2023-03-05 DIAGNOSIS — M7701 Medial epicondylitis, right elbow: Secondary | ICD-10-CM

## 2023-03-05 DIAGNOSIS — G5621 Lesion of ulnar nerve, right upper limb: Secondary | ICD-10-CM

## 2023-03-05 MED ORDER — NAPROXEN 500 MG PO TABS: 500.0000 mg | ORAL_TABLET | Freq: Two times a day (BID) | ORAL | 0 refills | Status: AC

## 2023-03-05 NOTE — Progress Notes (Signed)
    SUBJECTIVE:   CHIEF COMPLAINT / HPI: right arm tingling  1 month history achy pain on medial posterior side of elbow that began when she flew to [place].  Possibly exacerbated by lifting luggage.  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  This been ongoing for 1 month which is why she presents today.  She also has had associated pins-and-needles, numbness and tingling extending from her elbow and down her arm with this pain.  She has not noticed any weakness.  She did not notice any slurring of her speech or numbness and tingling otherwise when symptoms initially began.  Has been using Tylenol as needed which has provided some relief.  PERTINENT  PMH / PSH: HTN, Migraines, GAD, MDD  OBJECTIVE:   BP 111/78   Pulse (!) 103   Ht 5\' 1"  (1.549 m)   Wt 221 lb 12.8 oz (100.6 kg)   LMP 09/20/2015 (Exact Date)   SpO2 98%   BMI 41.91 kg/m   General: NAD, well appearing Neuro: A&O Respiratory: normal WOB on RA Extremities: Moving all 4 extremities equally Right Elbow Inspection: No obvious deformity, erythema, swelling, ecchymoses  Palpation: Mildly tender to palpation at medial epicondyle, cubital tunnel, olecranon, nontender at lateral epicondyle, over antecubital fossa ROM: Full extension flexion of elbow Special Tests: Reproducible pain with resisted pronation of elbow, reproducible pain with resisted wrist extension, valgus and varus stress test without laxity Strength: 5/5 flexion extension of the elbow   ASSESSMENT/PLAN:   Assessment & Plan Medial epicondylitis of elbow, right Given history and exam I suspect that there is component of medial epicondylitis and common flexor muscle inflammation that is causing significant component of her pain.  She also has components of common extensor pain which may be playing a role as well.  Discussed that she needs to avoid exacerbating elbow motions over the holidays to help  reduce her inflammation.  Ordered Naprosyn 500 g twice daily for 1 week.  Also counseled on compression sleeve if she feels that this may alleviate some of her pain.  Patient agreeable to this plan.  Follow-up in 2 weeks for reevaluation.  If not improving referral to sports medicine for right elbow ultrasound and potential corticosteroid injection. 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  However this is unclear as she does not have pins and needle sensation only in a ulnar distribution.  There is likely some component of nerve impingement through the cubital tunnel due to her above epicondylitis.  Expect this will improve with reduction in exacerbating motions as above and anti-inflammatory treatment.  There is possibility that there is radiculopathic component to her pain.  If not improving with treatment above, would consider cervical x-rays to evaluate for degenerative nerve root impingement.  Return in about 2 weeks (around 03/19/2023).  Celine Mans, MD [facility]

## 2023-03-05 NOTE — Patient Instructions (Addendum)
It was great to see you! Thank you for allowing me to participate in your care!  Our plans for today:  -I have sent medication Naprosyn to your pharmacy, you will take this twice a day with meals for the next week. -Please take Tylenol additionally as needed for pain. -You may order an elbow compression sleeve if you feel that this might help off of [employer]. -Please schedule follow-up appointment for 2 weeks.   Please arrive 15 minutes PRIOR to your next scheduled appointment time! If you do not, this affects OTHER patients' care.  Take care and seek immediate care sooner if you develop any concerns.   Celine Mans, MD, PGY-2 [facility] 3:15 PM 03/05/2023  [facility]

## 2023-03-26 ENCOUNTER — Other Ambulatory Visit (HOSPITAL_COMMUNITY): Payer: Self-pay

## 2023-03-26 DIAGNOSIS — F5105 Insomnia due to other mental disorder: Secondary | ICD-10-CM

## 2023-03-26 DIAGNOSIS — F4310 Post-traumatic stress disorder, unspecified: Secondary | ICD-10-CM

## 2023-03-26 DIAGNOSIS — F331 Major depressive disorder, recurrent, moderate: Secondary | ICD-10-CM

## 2023-03-26 DIAGNOSIS — F411 Generalized anxiety disorder: Secondary | ICD-10-CM

## 2023-03-26 MED ORDER — QUETIAPINE FUMARATE 300 MG PO TABS: 300.0000 mg | ORAL_TABLET | Freq: Every day | ORAL | 1 refills | Status: DC

## 2023-03-26 MED ORDER — VENLAFAXINE HCL ER 75 MG PO CP24: 225.0000 mg | ORAL_CAPSULE | Freq: Every day | ORAL | 1 refills | Status: DC

## 2023-04-12 ENCOUNTER — Other Ambulatory Visit: Payer: Self-pay | Admitting: Student

## 2023-04-12 DIAGNOSIS — I10 Essential (primary) hypertension: Secondary | ICD-10-CM

## 2023-04-26 ENCOUNTER — Telehealth (HOSPITAL_BASED_OUTPATIENT_CLINIC_OR_DEPARTMENT_OTHER): Payer: 59 | Admitting: Psychiatry

## 2023-04-26 ENCOUNTER — Encounter (HOSPITAL_COMMUNITY): Payer: Self-pay | Admitting: Psychiatry

## 2023-04-26 VITALS — Wt 224.0 lb

## 2023-04-26 DIAGNOSIS — F99 Mental disorder, not otherwise specified: Secondary | ICD-10-CM

## 2023-04-26 DIAGNOSIS — F411 Generalized anxiety disorder: Secondary | ICD-10-CM | POA: Diagnosis not present

## 2023-04-26 DIAGNOSIS — F5105 Insomnia due to other mental disorder: Secondary | ICD-10-CM

## 2023-04-26 DIAGNOSIS — F331 Major depressive disorder, recurrent, moderate: Secondary | ICD-10-CM

## 2023-04-26 DIAGNOSIS — F4310 Post-traumatic stress disorder, unspecified: Secondary | ICD-10-CM

## 2023-04-26 MED ORDER — QUETIAPINE FUMARATE 300 MG PO TABS: 300.0000 mg | ORAL_TABLET | Freq: Every day | ORAL | 2 refills | Status: DC

## 2023-04-26 MED ORDER — VENLAFAXINE HCL ER 75 MG PO CP24: 225.0000 mg | ORAL_CAPSULE | Freq: Every day | ORAL | 2 refills | Status: DC
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
## 2023-04-27 ENCOUNTER — Encounter: Payer: Self-pay | Admitting: Gastroenterology

## 2023-04-28 ENCOUNTER — Ambulatory Visit
Admission: RE | Admit: 2023-04-28 | Discharge: 2023-04-28 | Disposition: A | Discharge status: Home / Self Care | Payer: BC Managed Care – PPO | Source: Ambulatory Visit | Attending: Family Medicine | Admitting: Family Medicine

## 2023-04-28 ENCOUNTER — Ambulatory Visit
Admission: RE | Admit: 2023-04-28 | Discharge: 2023-04-28 | Disposition: A | Discharge status: Home / Self Care | Payer: 59 | Source: Ambulatory Visit | Attending: Family Medicine | Admitting: Family Medicine

## 2023-04-28 DIAGNOSIS — N6320 Unspecified lump in the left breast, unspecified quadrant: Secondary | ICD-10-CM

## 2023-05-18 ENCOUNTER — Ambulatory Visit: Admitting: Student

## 2023-05-18 NOTE — Progress Notes (Deleted)
    SUBJECTIVE:   CHIEF COMPLAINT / HPI:   Medication Management:  - Last seen by psychiatry, on Venlafaxine 75 mg and Seroquel 300 mg with dx of insomnia, GAD, MDD, PTSD - Otherwise with HTN on Amlodipine 10 mg, Valsartan 80 mg, hydrochlorothiazide 25 mg  - Patient reports today ***  PERTINENT  PMH / PSH:  HTN Migraine  History of LVH Anemia  Anxiety  Depression  PTSD  Insomnia  Obesity  Needs colonoscopy   OBJECTIVE:   LMP 09/20/2015 (Exact Date)   General: Alert and oriented in no apparent distress Heart: Regular rate and rhythm with no murmurs appreciated Lungs: CTA bilaterally, no wheezing Abdomen: Bowel sounds present, no abdominal pain Skin: Warm and dry Extremities: No lower extremity edema   ASSESSMENT/PLAN:   Assessment & Plan       Alfredo Martinez, MD [facility]

## 2023-05-19 ENCOUNTER — Telehealth: Payer: Self-pay | Admitting: *Deleted

## 2023-05-19 NOTE — Telephone Encounter (Signed)
 Attempt to reach pt for pre-visit. LM with call back #.  Will attempt to reach again in 5 min due to no other # listed in profile  Second attempt to reach pt for pre-vist unsuccessful. LM with facility # for pt to call back. Instructed pt to call # given by end of the day and reschedule the pre-visit  with RN or the scheduled procedure will be canceled.

## 2023-05-20 ENCOUNTER — Other Ambulatory Visit: Payer: Self-pay

## 2023-05-20 ENCOUNTER — Ambulatory Visit

## 2023-05-20 VITALS — Ht 61.0 in | Wt 223.0 lb

## 2023-05-20 DIAGNOSIS — Z1211 Encounter for screening for malignant neoplasm of colon: Secondary | ICD-10-CM

## 2023-05-20 MED ORDER — SUFLAVE 178.7 G PO SOLR: 1.0000 | ORAL | 0 refills | Status: DC

## 2023-05-20 NOTE — Progress Notes (Signed)
 Denies allergies to eggs or soy products. Denies complication of anesthesia or sedation. Denies use of weight loss medication. Denies use of O2.   Emmi instructions given for colonoscopy.

## 2023-05-26 ENCOUNTER — Telehealth: Payer: Self-pay | Admitting: Gastroenterology

## 2023-05-26 NOTE — Telephone Encounter (Signed)
 Returned call to patient, VM obtained and message left that RN will send in another prep (Suprep) to her local pharmacy which is [facility] as [facility] [address].  New instructions sent to patient via mychart and she was asked to call back if she had any questions. SChaplin, RN,BSN

## 2023-05-26 NOTE — Telephone Encounter (Signed)
 Inbound call from patient stating she spoke with [facility] and they will not accept her [facility] card. Advised patient she can request for [facility] to send prep medication to the pharmacy of her choice. Stated every time she calls there is no answer. Patient is requesting a call back. Please advise, thank you.

## 2023-05-31 ENCOUNTER — Encounter: Payer: Self-pay | Admitting: Gastroenterology

## 2023-06-04 ENCOUNTER — Telehealth: Payer: Self-pay | Admitting: Gastroenterology

## 2023-06-04 NOTE — Telephone Encounter (Signed)
 Spoke with patient she would like to have her  Prep instructions sent to My Chart Please. Her Procedure is on Monday 06-07-23

## 2023-06-04 NOTE — Telephone Encounter (Signed)
 LM informing pt that a copy of her instructions have been sent to her My Chart account and to call if has any questions

## 2023-06-07 ENCOUNTER — Encounter: Payer: Self-pay | Admitting: Gastroenterology

## 2023-06-07 ENCOUNTER — Ambulatory Visit: Payer: Self-pay | Admitting: Gastroenterology

## 2023-06-07 ENCOUNTER — Other Ambulatory Visit: Payer: Self-pay | Admitting: Gastroenterology

## 2023-06-07 VITALS — BP 120/70 | HR 73 | Temp 97.2°F | Resp 23 | Ht 61.0 in | Wt 223.0 lb

## 2023-06-07 DIAGNOSIS — Z1211 Encounter for screening for malignant neoplasm of colon: Secondary | ICD-10-CM | POA: Diagnosis present

## 2023-06-07 DIAGNOSIS — Q439 Congenital malformation of intestine, unspecified: Secondary | ICD-10-CM

## 2023-06-07 DIAGNOSIS — K648 Other hemorrhoids: Secondary | ICD-10-CM

## 2023-06-07 DIAGNOSIS — D120 Benign neoplasm of cecum: Secondary | ICD-10-CM | POA: Diagnosis not present

## 2023-06-07 MED ORDER — SODIUM CHLORIDE 0.9 % IV SOLN: 500.0000 mL | Freq: Once | INTRAVENOUS | Status: DC

## 2023-06-07 NOTE — Progress Notes (Signed)
 Pt's states no medical or surgical changes since previsit or office visit.

## 2023-06-07 NOTE — Progress Notes (Signed)
 Called to room to assist during endoscopic procedure.  Patient ID and intended procedure confirmed with present staff. Received instructions for my participation in the procedure from the performing physician.

## 2023-06-07 NOTE — Progress Notes (Signed)
 Pt awake, alert and oriented. VSS. Airway intact. SBAR complete to RN. All questions answered.

## 2023-06-07 NOTE — Patient Instructions (Signed)
 Resume previous diet and medications.  Handout provided on polyps.  Repeat colonoscopy based on biopsy results.    YOU HAD AN ENDOSCOPIC PROCEDURE TODAY AT THE [facility]:   Refer to the procedure report that was given to you for any specific questions about what was found during the examination.  If the procedure report does not answer your questions, please call your gastroenterologist to clarify.  If you requested that your care partner not be given the details of your procedure findings, then the procedure report has been included in a sealed envelope for you to review at your convenience later.  YOU SHOULD EXPECT: Some feelings of bloating in the abdomen. Passage of more gas than usual.  Walking can help get rid of the air that was put into your GI tract during the procedure and reduce the bloating. If you had a lower endoscopy (such as a colonoscopy or flexible sigmoidoscopy) you may notice spotting of blood in your stool or on the toilet paper. If you underwent a bowel prep for your procedure, you may not have a normal bowel movement for a few days.  Please Note:  You might notice some irritation and congestion in your nose or some drainage.  This is from the oxygen used during your procedure.  There is no need for concern and it should clear up in a day or so.  SYMPTOMS TO REPORT IMMEDIATELY:  Following lower endoscopy (colonoscopy or flexible sigmoidoscopy):  Excessive amounts of blood in the stool  Significant tenderness or worsening of abdominal pains  Swelling of the abdomen that is new, acute  Fever of 100F or higher  For urgent or emergent issues, a gastroenterologist can be reached at any hour by calling [phone]. Do not use MyChart messaging for urgent concerns.    DIET:  We do recommend a small meal at first, but then you may proceed to your regular diet.  Drink plenty of fluids but you should avoid alcoholic beverages for 24 hours.  ACTIVITY:  You should  plan to take it easy for the rest of today and you should NOT DRIVE or use heavy machinery until tomorrow (because of the sedation medicines used during the test).    FOLLOW UP: Our staff will call the number listed on your records the next business day following your procedure.  We will call around 7:15- 8:00 am to check on you and address any questions or concerns that you may have regarding the information given to you following your procedure. If we do not reach you, we will leave a message.     If any biopsies were taken you will be contacted by phone or by letter within the next 1-3 weeks.  Please call us at [phone] if you have not heard about the biopsies in 3 weeks.    SIGNATURES/CONFIDENTIALITY: You and/or your care partner have signed paperwork which will be entered into your electronic medical record.  These signatures attest to the fact that that the information above on your After Visit Summary has been reviewed and is understood.  Full responsibility of the confidentiality of this discharge information lies with you and/or your care-partner.

## 2023-06-07 NOTE — Progress Notes (Signed)
 [facility] History and Physical   Primary Care Physician:  Alfredo Martinez, MD   Reason for Procedure:   Colon cancer screening  Plan:    colonoscopy     HPI: Wendy Duffy is a 48 y.o. female  here for colonoscopy screening - first time exam.   Patient denies any bowel symptoms at this time. No family history of colon cancer known. Otherwise feels well without any cardiopulmonary symptoms.   I have discussed risks / benefits of anesthesia and endoscopic procedure with Lissa Merlin and they wish to proceed with the exams as outlined today.    Past Medical History:  Diagnosis Date   Allergy    Anemia    Anxiety    Arthritis    both knees oa   Depression    Enlarged heart    left worked up for 6 yrs ago, benign released by cardiology heart center at [place]   Gastritis    GERD (gastroesophageal reflux disease)    Hemorrhoids    Hypertension    Patellar instability of left knee    Status post laparoscopic hysterectomy 10/21/2015   TLH with BSO    Past Surgical History:  Procedure Laterality Date   BILATERAL SALPINGECTOMY Bilateral 10/21/2015   Procedure: BILATERAL SALPINGECTOMY;  Surgeon: Edwinna Areola, DO;  Location: [facility];  Service: Gynecology;  Laterality: Bilateral;   BREAST BIOPSY Left 05/20/2022   [place] LT BREAST BX W LOC DEV 1ST LESION IMG BX SPEC US GUIDE 05/20/2022 [facility]   BREAST BIOPSY Left 05/20/2022   [place] LT BREAST BX W LOC DEV EA ADD LESION IMG BX SPEC US GUIDE 05/20/2022 [facility]   CESAREAN SECTION  1998, 2003   KNEE ARTHROSCOPY WITH MEDIAL PATELLAR FEMORAL LIGAMENT RECONSTRUCTION Left 05/19/2019   Procedure: KNEE ARTHROSCOPY WITH CHONDROPLASTY, MEDIAL PATELLAR FEMORAL LIGAMENT RECONSTRUCTION;  Surgeon: Yolonda Kida, MD;  Location: [facility];  Service: Orthopedics;  Laterality: Left;  2 HRS   LAPAROSCOPIC HYSTERECTOMY N/A 10/21/2015   Procedure: HYSTERECTOMY TOTAL LAPAROSCOPIC;  Surgeon: Edwinna Areola, DO;  Location: [facility];  Service: Gynecology;  Laterality: N/A;   WISDOM TOOTH EXTRACTION      Prior to Admission medications   Medication Sig Start Date End Date Taking? Authorizing Provider  amLODipine (NORVASC) 10 MG tablet TAKE 1 TABLET BY MOUTH DAILY 04/12/23  Yes Maxwell, Allee, MD  hydrochlorothiazide (HYDRODIURIL) 25 MG tablet TAKE 1 TABLET BY MOUTH DAILY 02/22/23  Yes Alfredo Martinez, MD  QUEtiapine (SEROQUEL) 300 MG tablet Take 1 tablet (300 mg total) by mouth at bedtime. 04/26/23  Yes Arfeen, Phillips Grout, MD  valsartan (DIOVAN) 80 MG tablet TAKE 1 TABLET BY MOUTH AT BEDTIME 04/12/23  Yes Maxwell, Allee, MD  venlafaxine XR (EFFEXOR-XR) 75 MG 24 hr capsule Take 3 capsules (225 mg total) by mouth daily. 04/26/23  Yes Arfeen, Phillips Grout, MD    Current Outpatient Medications  Medication Sig Dispense Refill   amLODipine (NORVASC) 10 MG tablet TAKE 1 TABLET BY MOUTH DAILY 30 tablet 2   hydrochlorothiazide (HYDRODIURIL) 25 MG tablet TAKE 1 TABLET BY MOUTH DAILY 90 tablet 0   QUEtiapine (SEROQUEL) 300 MG tablet Take 1 tablet (300 mg total) by mouth at bedtime. 30 tablet 2   valsartan (DIOVAN) 80 MG tablet TAKE 1 TABLET BY MOUTH AT BEDTIME 30 tablet 2   venlafaxine XR (EFFEXOR-XR) 75 MG 24 hr capsule Take 3 capsules (225 mg total) by mouth daily. 90 capsule 2  Current Facility-Administered Medications  Medication Dose Route Frequency Provider Last Rate Last Admin   0.9 %  sodium chloride infusion  500 mL Intravenous Once Briley Sulton, Willaim Rayas, MD        Allergies as of 06/07/2023 - Review Complete 06/07/2023  Allergen Reaction Noted   Zoloft [sertraline hcl] Other (See Comments) 04/06/2012    Family History  Problem Relation Age of Onset   Alcohol abuse Mother    Drug abuse Maternal Uncle    Alcohol abuse Maternal Grandmother    Drug abuse Maternal Grandmother    Colon cancer Neg Hx    Esophageal cancer Neg Hx    Rectal cancer Neg Hx    Stomach cancer Neg Hx     Social  History   Socioeconomic History   Marital status: Married    Spouse name: Not on file   Number of children: Not on file   Years of education: Not on file   Highest education level: Not on file  Occupational History   Occupation: preschool teacher  Tobacco Use   Smoking status: Never   Smokeless tobacco: Current   Tobacco comments:    Occasional Vape.  Vaping Use   Vaping status: Never Used  Substance and Sexual Activity   Alcohol use: Not Currently   Drug use: No    Types: Marijuana    Comment: last used marijuana last used 3 weeks ago as of 05-15-2019   Sexual activity: Yes    Birth control/protection: None  Other Topics Concern   Not on file  Social History Narrative   Not on file   Social Drivers of Health   Financial Resource Strain: Not on file  Food Insecurity: Not on file  Transportation Needs: Not on file  Physical Activity: Not on file  Stress: Not on file  Social Connections: Not on file  Intimate Partner Violence: Not on file    Review of Systems: All other review of systems negative except as mentioned in the HPI.  Physical Exam: Vital signs BP 119/69   Pulse 77   Temp (!) 97.2 F (36.2 C) (Temporal)   Ht 5\' 1"  (1.549 m)   Wt 223 lb (101.2 kg)   LMP 09/20/2015 (Exact Date)   SpO2 100%   BMI 42.14 kg/m   General:   Alert,  Well-developed, pleasant and cooperative in NAD Lungs:  Clear throughout to auscultation.   Heart:  Regular rate and rhythm Abdomen:  Soft, nontender and nondistended.   Neuro/Psych:  Alert and cooperative. Normal mood and affect. A and O x 3  Harlin Rain, MD [facility]

## 2023-06-07 NOTE — Op Note (Signed)
 [facility] Patient Name: Wendy Duffy Procedure Date: 06/07/2023 11:18 AM MRN: [id number] Endoscopist: Viviann Spare P. Adela Lank , MD, [id number] Age: 48 Referring MD:  Date of Birth: 09/03/1975 Gender: Female Account #: [id number] Procedure:                Colonoscopy Indications:              Screening for colorectal malignant neoplasm, This                            is the patient's first colonoscopy Medicines:                Monitored Anesthesia Care Procedure:                Pre-Anesthesia Assessment:                           - Prior to the procedure, a History and Physical                            was performed, and patient medications and                            allergies were reviewed. The patient's tolerance of                            previous anesthesia was also reviewed. The risks                            and benefits of the procedure and the sedation                            options and risks were discussed with the patient.                            All questions were answered, and informed consent                            was obtained. Prior Anticoagulants: The patient has                            taken no anticoagulant or antiplatelet agents. ASA                            Grade Assessment: II - A patient with mild systemic                            disease. After reviewing the risks and benefits,                            the patient was deemed in satisfactory condition to                            undergo the procedure.  After obtaining informed consent, the colonoscope                            was passed under direct vision. Throughout the                            procedure, the patient's blood pressure, pulse, and                            oxygen saturations were monitored continuously. The                            CF [id number] #[id number] was introduced through the anus                            and advanced to  the the cecum, identified by                            appendiceal orifice and ileocecal valve. The                            colonoscopy was performed without difficulty. The                            patient tolerated the procedure well. The quality                            of the bowel preparation was good. The ileocecal                            valve, appendiceal orifice, and rectum were                            photographed. Scope In: 11:26:20 AM Scope Out: 11:46:00 AM Scope Withdrawal Time: 0 hours 14 minutes 48 seconds  Total Procedure Duration: 0 hours 19 minutes 40 seconds  Findings:                 The perianal and digital rectal examinations were                            normal.                           A diminutive polyp was found in the cecum. The                            polyp was sessile. The polyp was removed with a                            cold snare. Resection and retrieval were complete.                           Internal hemorrhoids were found during retroflexion.  The right colon was tortous. The exam was otherwise                            without abnormality. Complications:            No immediate complications. Estimated blood loss:                            Minimal. Estimated Blood Loss:     Estimated blood loss was minimal. Impression:               - One diminutive polyp in the cecum, removed with a                            cold snare. Resected and retrieved.                           - Tortous right colon.                           - Internal hemorrhoids.                           - The examination was otherwise normal. Recommendation:           - Patient has a contact number available for                            emergencies. The signs and symptoms of potential                            delayed complications were discussed with the                            patient. Return to normal activities tomorrow.                             Written discharge instructions were provided to the                            patient.                           - Resume previous diet.                           - Continue present medications.                           - Await pathology results. Viviann Spare P. Ares Cardozo, MD 06/07/2023 11:50:47 AM This report has been signed electronically.

## 2023-06-08 ENCOUNTER — Telehealth: Payer: Self-pay | Admitting: *Deleted

## 2023-06-08 NOTE — Telephone Encounter (Signed)
 Attempted post procedure follow up call.  No answer - LVM.

## 2023-06-10 ENCOUNTER — Encounter: Payer: Self-pay | Admitting: Gastroenterology

## 2023-06-10 LAB — SURGICAL PATHOLOGY

## 2023-07-22 ENCOUNTER — Telehealth (HOSPITAL_COMMUNITY): Payer: 59 | Admitting: Psychiatry

## 2023-07-22 DIAGNOSIS — Z91199 Patient's noncompliance with other medical treatment and regimen due to unspecified reason: Secondary | ICD-10-CM

## 2023-07-22 NOTE — Progress Notes (Signed)
 Patient is no-show on video platform.  Tried to call on her cell and leave a message to reschedule appointment.

## 2023-07-29 ENCOUNTER — Other Ambulatory Visit (HOSPITAL_COMMUNITY): Payer: Self-pay | Admitting: Psychiatry

## 2023-07-29 DIAGNOSIS — F331 Major depressive disorder, recurrent, moderate: Secondary | ICD-10-CM

## 2023-07-29 DIAGNOSIS — F411 Generalized anxiety disorder: Secondary | ICD-10-CM

## 2023-07-29 DIAGNOSIS — F4310 Post-traumatic stress disorder, unspecified: Secondary | ICD-10-CM

## 2023-07-29 DIAGNOSIS — F5105 Insomnia due to other mental disorder: Secondary | ICD-10-CM

## 2023-08-04 ENCOUNTER — Other Ambulatory Visit (HOSPITAL_COMMUNITY): Payer: Self-pay

## 2023-08-04 DIAGNOSIS — F5105 Insomnia due to other mental disorder: Secondary | ICD-10-CM

## 2023-08-04 DIAGNOSIS — F331 Major depressive disorder, recurrent, moderate: Secondary | ICD-10-CM

## 2023-08-04 DIAGNOSIS — F4310 Post-traumatic stress disorder, unspecified: Secondary | ICD-10-CM

## 2023-08-04 DIAGNOSIS — F411 Generalized anxiety disorder: Secondary | ICD-10-CM

## 2023-08-04 MED ORDER — VENLAFAXINE HCL ER 75 MG PO CP24: 225.0000 mg | ORAL_CAPSULE | Freq: Every day | ORAL | 0 refills | Status: DC

## 2023-08-04 MED ORDER — QUETIAPINE FUMARATE 300 MG PO TABS: 300.0000 mg | ORAL_TABLET | Freq: Every day | ORAL | 0 refills | Status: DC

## 2023-08-07 ENCOUNTER — Other Ambulatory Visit (HOSPITAL_COMMUNITY): Payer: Self-pay | Admitting: Psychiatry

## 2023-08-07 DIAGNOSIS — F4310 Post-traumatic stress disorder, unspecified: Secondary | ICD-10-CM

## 2023-08-07 DIAGNOSIS — F331 Major depressive disorder, recurrent, moderate: Secondary | ICD-10-CM

## 2023-08-07 DIAGNOSIS — F411 Generalized anxiety disorder: Secondary | ICD-10-CM

## 2023-08-07 DIAGNOSIS — F5105 Insomnia due to other mental disorder: Secondary | ICD-10-CM

## 2023-08-11 ENCOUNTER — Telehealth (HOSPITAL_BASED_OUTPATIENT_CLINIC_OR_DEPARTMENT_OTHER): Admitting: Psychiatry

## 2023-08-11 ENCOUNTER — Encounter (HOSPITAL_COMMUNITY): Payer: Self-pay | Admitting: Psychiatry

## 2023-08-11 VITALS — Wt 229.0 lb

## 2023-08-11 DIAGNOSIS — F331 Major depressive disorder, recurrent, moderate: Secondary | ICD-10-CM

## 2023-08-11 DIAGNOSIS — F5105 Insomnia due to other mental disorder: Secondary | ICD-10-CM

## 2023-08-11 DIAGNOSIS — F4310 Post-traumatic stress disorder, unspecified: Secondary | ICD-10-CM | POA: Diagnosis not present

## 2023-08-11 DIAGNOSIS — F411 Generalized anxiety disorder: Secondary | ICD-10-CM

## 2023-08-11 MED ORDER — TRAZODONE HCL 50 MG PO TABS: 50.0000 mg | ORAL_TABLET | Freq: Every day | ORAL | 1 refills | Status: DC

## 2023-08-11 MED ORDER — VENLAFAXINE HCL ER 75 MG PO CP24: 225.0000 mg | ORAL_CAPSULE | Freq: Every day | ORAL | 1 refills | Status: DC

## 2023-08-11 MED ORDER — QUETIAPINE FUMARATE 200 MG PO TABS: 200.0000 mg | ORAL_TABLET | Freq: Every day | ORAL | 0 refills | Status: DC
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
## 2023-08-19 ENCOUNTER — Other Ambulatory Visit: Payer: Self-pay | Admitting: Student

## 2023-08-19 DIAGNOSIS — I10 Essential (primary) hypertension: Secondary | ICD-10-CM

## 2023-08-24 ENCOUNTER — Encounter: Payer: Self-pay | Admitting: *Deleted

## 2023-09-01 ENCOUNTER — Encounter (HOSPITAL_COMMUNITY): Payer: Self-pay | Admitting: Psychiatry

## 2023-09-01 ENCOUNTER — Telehealth (HOSPITAL_BASED_OUTPATIENT_CLINIC_OR_DEPARTMENT_OTHER): Admitting: Psychiatry

## 2023-09-01 DIAGNOSIS — F331 Major depressive disorder, recurrent, moderate: Secondary | ICD-10-CM

## 2023-09-01 DIAGNOSIS — F5105 Insomnia due to other mental disorder: Secondary | ICD-10-CM

## 2023-09-01 DIAGNOSIS — F99 Mental disorder, not otherwise specified: Secondary | ICD-10-CM | POA: Diagnosis not present

## 2023-09-01 DIAGNOSIS — F411 Generalized anxiety disorder: Secondary | ICD-10-CM

## 2023-09-01 DIAGNOSIS — F4310 Post-traumatic stress disorder, unspecified: Secondary | ICD-10-CM

## 2023-09-01 MED ORDER — VENLAFAXINE HCL ER 75 MG PO CP24: 225.0000 mg | ORAL_CAPSULE | Freq: Every day | ORAL | 2 refills | Status: DC

## 2023-09-01 MED ORDER — QUETIAPINE FUMARATE 200 MG PO TABS: 200.0000 mg | ORAL_TABLET | Freq: Every day | ORAL | 2 refills | Status: DC

## 2023-09-01 MED ORDER — TRAZODONE HCL 50 MG PO TABS: 50.0000 mg | ORAL_TABLET | Freq: Every day | ORAL | 2 refills | Status: DC
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
## 2023-10-07 ENCOUNTER — Telehealth (HOSPITAL_COMMUNITY): Payer: Self-pay

## 2023-10-07 ENCOUNTER — Other Ambulatory Visit (HOSPITAL_COMMUNITY): Payer: Self-pay

## 2023-10-07 MED ORDER — HYDROXYZINE PAMOATE 25 MG PO CAPS: 25.0000 mg | ORAL_CAPSULE | Freq: Every day | ORAL | 0 refills | Status: DC

## 2023-10-07 NOTE — Telephone Encounter (Signed)
 Sent to pharmacy, called patient and left a voicemail

## 2023-10-07 NOTE — Telephone Encounter (Signed)
 Other option is try hydroxyzine  25-50 mg at bed time.

## 2023-10-07 NOTE — Telephone Encounter (Signed)
 Patient is calling to let you know that her Trazodone  is making her itch. Please review and advise, thank you

## 2023-10-14 ENCOUNTER — Encounter (HOSPITAL_COMMUNITY): Payer: Self-pay

## 2023-10-14 ENCOUNTER — Ambulatory Visit (INDEPENDENT_AMBULATORY_CARE_PROVIDER_SITE_OTHER): Admitting: Clinical

## 2023-10-14 ENCOUNTER — Encounter (HOSPITAL_COMMUNITY): Payer: Self-pay | Admitting: Clinical

## 2023-10-14 DIAGNOSIS — F331 Major depressive disorder, recurrent, moderate: Secondary | ICD-10-CM | POA: Diagnosis not present

## 2023-10-14 DIAGNOSIS — F411 Generalized anxiety disorder: Secondary | ICD-10-CM

## 2023-10-14 DIAGNOSIS — F1021 Alcohol dependence, in remission: Secondary | ICD-10-CM | POA: Diagnosis not present

## 2023-10-14 DIAGNOSIS — F4310 Post-traumatic stress disorder, unspecified: Secondary | ICD-10-CM
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
## 2023-10-21 ENCOUNTER — Encounter (HOSPITAL_COMMUNITY): Payer: Self-pay | Admitting: Clinical

## 2023-10-21 ENCOUNTER — Ambulatory Visit (HOSPITAL_COMMUNITY): Admitting: Clinical

## 2023-10-21 DIAGNOSIS — F411 Generalized anxiety disorder: Secondary | ICD-10-CM

## 2023-10-21 DIAGNOSIS — F331 Major depressive disorder, recurrent, moderate: Secondary | ICD-10-CM | POA: Diagnosis not present

## 2023-10-21 DIAGNOSIS — F4310 Post-traumatic stress disorder, unspecified: Secondary | ICD-10-CM | POA: Diagnosis not present

## 2023-10-21 DIAGNOSIS — F1021 Alcohol dependence, in remission: Secondary | ICD-10-CM | POA: Diagnosis not present
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
## 2023-11-03 ENCOUNTER — Encounter (HOSPITAL_COMMUNITY): Payer: Self-pay | Admitting: Clinical

## 2023-11-03 ENCOUNTER — Ambulatory Visit (INDEPENDENT_AMBULATORY_CARE_PROVIDER_SITE_OTHER): Admitting: Clinical

## 2023-11-03 DIAGNOSIS — F331 Major depressive disorder, recurrent, moderate: Secondary | ICD-10-CM

## 2023-11-03 DIAGNOSIS — F411 Generalized anxiety disorder: Secondary | ICD-10-CM

## 2023-11-03 DIAGNOSIS — F4310 Post-traumatic stress disorder, unspecified: Secondary | ICD-10-CM | POA: Diagnosis not present

## 2023-11-03 DIAGNOSIS — F1021 Alcohol dependence, in remission: Secondary | ICD-10-CM
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
## 2023-11-30 ENCOUNTER — Encounter (HOSPITAL_COMMUNITY): Payer: Self-pay | Admitting: Psychiatry

## 2023-11-30 ENCOUNTER — Telehealth (HOSPITAL_COMMUNITY): Admitting: Psychiatry

## 2023-11-30 VITALS — Wt 210.0 lb

## 2023-11-30 DIAGNOSIS — F331 Major depressive disorder, recurrent, moderate: Secondary | ICD-10-CM

## 2023-11-30 DIAGNOSIS — F411 Generalized anxiety disorder: Secondary | ICD-10-CM | POA: Diagnosis not present

## 2023-11-30 MED ORDER — HYDROXYZINE PAMOATE 25 MG PO CAPS: 25.0000 mg | ORAL_CAPSULE | Freq: Every day | ORAL | 2 refills | Status: DC

## 2023-11-30 MED ORDER — VENLAFAXINE HCL ER 75 MG PO CP24: 225.0000 mg | ORAL_CAPSULE | Freq: Every day | ORAL | 2 refills | Status: DC
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
## 2023-12-10 ENCOUNTER — Telehealth (HOSPITAL_COMMUNITY): Payer: Self-pay | Admitting: *Deleted

## 2023-12-10 ENCOUNTER — Other Ambulatory Visit (HOSPITAL_COMMUNITY): Payer: Self-pay | Admitting: Psychiatry

## 2023-12-10 ENCOUNTER — Encounter (HOSPITAL_COMMUNITY): Payer: Self-pay

## 2023-12-10 ENCOUNTER — Other Ambulatory Visit (HOSPITAL_COMMUNITY): Payer: Self-pay | Admitting: *Deleted

## 2023-12-10 DIAGNOSIS — F5105 Insomnia due to other mental disorder: Secondary | ICD-10-CM

## 2023-12-10 DIAGNOSIS — F4310 Post-traumatic stress disorder, unspecified: Secondary | ICD-10-CM

## 2023-12-10 DIAGNOSIS — F411 Generalized anxiety disorder: Secondary | ICD-10-CM

## 2023-12-10 DIAGNOSIS — F331 Major depressive disorder, recurrent, moderate: Secondary | ICD-10-CM

## 2023-12-10 MED ORDER — QUETIAPINE FUMARATE 200 MG PO TABS: 200.0000 mg | ORAL_TABLET | Freq: Every day | ORAL | 2 refills | Status: DC

## 2023-12-10 NOTE — Telephone Encounter (Signed)
 Pt requests refill of Seroquel  200 mg at bedtime. She is out of this medication and has scheduled f/u for 03/02/24.

## 2023-12-14 ENCOUNTER — Ambulatory Visit (INDEPENDENT_AMBULATORY_CARE_PROVIDER_SITE_OTHER): Admitting: Clinical

## 2023-12-14 DIAGNOSIS — F4310 Post-traumatic stress disorder, unspecified: Secondary | ICD-10-CM

## 2023-12-14 DIAGNOSIS — F331 Major depressive disorder, recurrent, moderate: Secondary | ICD-10-CM | POA: Diagnosis not present

## 2023-12-14 DIAGNOSIS — F411 Generalized anxiety disorder: Secondary | ICD-10-CM
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
## 2023-12-16 ENCOUNTER — Encounter (HOSPITAL_COMMUNITY): Payer: Self-pay | Admitting: Clinical

## 2023-12-24 ENCOUNTER — Other Ambulatory Visit: Payer: Self-pay

## 2023-12-25 MED ORDER — HYDROCHLOROTHIAZIDE 25 MG PO TABS: 25.0000 mg | ORAL_TABLET | Freq: Every day | ORAL | 0 refills | Status: DC

## 2023-12-25 MED ORDER — AMLODIPINE BESYLATE 10 MG PO TABS: 10.0000 mg | ORAL_TABLET | Freq: Every day | ORAL | 0 refills | Status: DC

## 2023-12-25 NOTE — Telephone Encounter (Signed)
 Chart reviewed  -Fairy Amy, MD

## 2023-12-30 ENCOUNTER — Ambulatory Visit (INDEPENDENT_AMBULATORY_CARE_PROVIDER_SITE_OTHER): Payer: Self-pay | Admitting: Clinical

## 2023-12-30 DIAGNOSIS — Z91199 Patient's noncompliance with other medical treatment and regimen due to unspecified reason: Secondary | ICD-10-CM

## 2023-12-30 NOTE — Progress Notes (Signed)
 Therapy Progress Note  Patient had an appointment scheduled with therapist on 12/30/2023  at 4:00pm.  CSW called patient at 4:10pm and was informed she thought the appointment on 4/30 was her next session, did not remember this one.  She did not arrive for the session by 4:15pm, so was considered a no show.  As per [facility] policy, she will be be charged for this no-show appointment.    Encounter Diagnosis  Name Primary?   No-show for appointment Yes     Elgie Crest, LCSW 12/30/2023, 4:16 PM

## 2024-01-13 ENCOUNTER — Ambulatory Visit (INDEPENDENT_AMBULATORY_CARE_PROVIDER_SITE_OTHER): Payer: Self-pay | Admitting: Clinical

## 2024-01-13 DIAGNOSIS — Z91199 Patient's noncompliance with other medical treatment and regimen due to unspecified reason: Secondary | ICD-10-CM

## 2024-01-13 NOTE — Progress Notes (Signed)
 Therapy Progress Note  Patient had an appointment scheduled with therapist on 01/13/2024  at 4:00pm.  CSW called patient at 4:05pm and left a HIPAA-compliant message informing her that it could be switched to virtual if needed. She was also reminded that if she does not come today, it will be her 2nd no-show in a row.  She did not arrive for the session by 4:15pm, so was considered a no show.  As per [facility] policy, she will be be charged for this no-show appointment.    Encounter Diagnosis  Name Primary?   No-show for appointment Yes     Elgie Crest, LCSW 01/13/2024, 4:46 PM

## 2024-02-08 ENCOUNTER — Other Ambulatory Visit (HOSPITAL_COMMUNITY): Payer: Self-pay | Admitting: Psychiatry

## 2024-02-08 DIAGNOSIS — F411 Generalized anxiety disorder: Secondary | ICD-10-CM

## 2024-02-08 DIAGNOSIS — F5105 Insomnia due to other mental disorder: Secondary | ICD-10-CM

## 2024-02-08 DIAGNOSIS — F4310 Post-traumatic stress disorder, unspecified: Secondary | ICD-10-CM

## 2024-02-08 DIAGNOSIS — F331 Major depressive disorder, recurrent, moderate: Secondary | ICD-10-CM

## 2024-02-24 ENCOUNTER — Ambulatory Visit (INDEPENDENT_AMBULATORY_CARE_PROVIDER_SITE_OTHER): Admitting: Clinical

## 2024-02-24 DIAGNOSIS — F411 Generalized anxiety disorder: Secondary | ICD-10-CM | POA: Diagnosis not present

## 2024-02-24 DIAGNOSIS — F331 Major depressive disorder, recurrent, moderate: Secondary | ICD-10-CM

## 2024-02-24 DIAGNOSIS — F4310 Post-traumatic stress disorder, unspecified: Secondary | ICD-10-CM

## 2024-02-25 ENCOUNTER — Encounter (HOSPITAL_COMMUNITY): Payer: Self-pay | Admitting: Clinical
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
## 2024-03-01 ENCOUNTER — Encounter (HOSPITAL_COMMUNITY): Payer: Self-pay

## 2024-03-01 ENCOUNTER — Ambulatory Visit (INDEPENDENT_AMBULATORY_CARE_PROVIDER_SITE_OTHER): Payer: Self-pay | Admitting: Clinical

## 2024-03-01 DIAGNOSIS — Z91199 Patient's noncompliance with other medical treatment and regimen due to unspecified reason: Secondary | ICD-10-CM

## 2024-03-02 ENCOUNTER — Telehealth (HOSPITAL_COMMUNITY): Admitting: Psychiatry

## 2024-03-02 VITALS — Wt 210.0 lb

## 2024-03-02 DIAGNOSIS — F411 Generalized anxiety disorder: Secondary | ICD-10-CM | POA: Diagnosis not present

## 2024-03-02 DIAGNOSIS — F4310 Post-traumatic stress disorder, unspecified: Secondary | ICD-10-CM | POA: Diagnosis not present

## 2024-03-02 DIAGNOSIS — F99 Mental disorder, not otherwise specified: Secondary | ICD-10-CM | POA: Diagnosis not present

## 2024-03-02 DIAGNOSIS — F5105 Insomnia due to other mental disorder: Secondary | ICD-10-CM

## 2024-03-02 DIAGNOSIS — F331 Major depressive disorder, recurrent, moderate: Secondary | ICD-10-CM | POA: Diagnosis not present

## 2024-03-02 MED ORDER — HYDROXYZINE PAMOATE 25 MG PO CAPS: 25.0000 mg | ORAL_CAPSULE | Freq: Every day | ORAL | 3 refills | Status: AC

## 2024-03-02 MED ORDER — VENLAFAXINE HCL ER 75 MG PO CP24: 225.0000 mg | ORAL_CAPSULE | Freq: Every day | ORAL | 3 refills | Status: AC

## 2024-03-02 MED ORDER — QUETIAPINE FUMARATE 200 MG PO TABS: 200.0000 mg | ORAL_TABLET | Freq: Every day | ORAL | 3 refills | Status: AC

## 2024-03-02 NOTE — Progress Notes (Signed)
 Group Therapy   Wendy Duffy  was scheduled to attend group therapy on 03/01/2024  at 5:00pm.  Patient was sent an email with the link to join the group and a text to remind her that the group is held on [employer] and that a link has been emailed to her.  The patient did not show up for group.  Encounter Diagnosis  Name Primary?   No-show for appointment Yes     Elgie Crest, LCSW 03/02/2024, 12:48 PM
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
## 2024-03-30 ENCOUNTER — Other Ambulatory Visit: Payer: Self-pay

## 2024-03-30 NOTE — Telephone Encounter (Signed)
 Chart reviewed, needs appt.  [name], MD

## 2024-04-03 ENCOUNTER — Other Ambulatory Visit: Payer: Self-pay

## 2024-04-03 DIAGNOSIS — I10 Essential (primary) hypertension: Secondary | ICD-10-CM

## 2024-04-03 MED ORDER — VALSARTAN 80 MG PO TABS: 80.0000 mg | ORAL_TABLET | Freq: Every day | ORAL | 0 refills | Status: DC

## 2024-04-03 NOTE — Telephone Encounter (Signed)
 Spoke with patient made appt for 1/22 at 3:30. Nelson Land, CMA

## 2024-04-03 NOTE — Telephone Encounter (Signed)
 Chart reviewed  -Fairy Amy, MD

## 2024-04-06 ENCOUNTER — Ambulatory Visit: Payer: Self-pay

## 2024-04-06 VITALS — BP 126/89 | HR 100 | Ht 61.0 in | Wt 203.2 lb

## 2024-04-06 DIAGNOSIS — Z0000 Encounter for general adult medical examination without abnormal findings: Secondary | ICD-10-CM | POA: Diagnosis not present

## 2024-04-06 DIAGNOSIS — R600 Localized edema: Secondary | ICD-10-CM

## 2024-04-06 DIAGNOSIS — I10 Essential (primary) hypertension: Secondary | ICD-10-CM

## 2024-04-06 DIAGNOSIS — Z630 Problems in relationship with spouse or partner: Secondary | ICD-10-CM | POA: Diagnosis not present

## 2024-04-06 DIAGNOSIS — R928 Other abnormal and inconclusive findings on diagnostic imaging of breast: Secondary | ICD-10-CM

## 2024-04-06 DIAGNOSIS — E669 Obesity, unspecified: Secondary | ICD-10-CM | POA: Diagnosis not present

## 2024-04-06 DIAGNOSIS — L819 Disorder of pigmentation, unspecified: Secondary | ICD-10-CM | POA: Diagnosis not present

## 2024-04-06 LAB — POCT URINE PREGNANCY: Preg Test, Ur: NEGATIVE

## 2024-04-06 LAB — POCT GLYCOSYLATED HEMOGLOBIN (HGB A1C): Hemoglobin A1C: 4.9 % (ref 4.0–5.6)

## 2024-04-06 MED ORDER — OLMESARTAN-AMLODIPINE-HCTZ 40-10-25 MG PO TABS: 1.0000 | ORAL_TABLET | Freq: Every day | ORAL | 0 refills | Status: AC
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
## 2024-04-08 LAB — BASIC METABOLIC PANEL WITH GFR
BUN/Creatinine Ratio: 13 (ref 9–23)
BUN: 10 mg/dL (ref 6–24)
CO2: 24 mmol/L (ref 20–29)
Calcium: 10.3 mg/dL — ABNORMAL HIGH (ref 8.7–10.2)
Chloride: 99 mmol/L (ref 96–106)
Creatinine, Ser: 0.79 mg/dL (ref 0.57–1.00)
Glucose: 75 mg/dL (ref 70–99)
Potassium: 4.2 mmol/L (ref 3.5–5.2)
Sodium: 141 mmol/L (ref 134–144)
eGFR: 92 mL/min/{1.73_m2}

## 2024-04-08 LAB — HEPATITIS B SURFACE ANTIBODY, QUANTITATIVE: Hepatitis B Surf Ab Quant: <3.5 m[IU]/mL — ABNORMAL LOW

## 2024-04-08 LAB — BRAIN NATRIURETIC PEPTIDE: BNP: <2.5 pg/mL (ref 0.0–100.0)

## 2024-04-12 ENCOUNTER — Ambulatory Visit: Payer: Self-pay

## 2024-04-12 DIAGNOSIS — Z789 Other specified health status: Secondary | ICD-10-CM | POA: Insufficient documentation

## 2024-04-13 ENCOUNTER — Telehealth: Payer: Self-pay | Admitting: *Deleted

## 2024-04-13 NOTE — Progress Notes (Signed)
 Complex Care Management Note  Care Guide Note 04/13/2024 Name: Wendy Duffy MRN: [id number] DOB: [birth year]  Wendy Duffy is a 49 y.o. year old female who sees Lorrane Pac, MD for primary care. I reached out to Verneita JONETTA Bishop by phone today to offer complex care management services.  [name] was given information about Complex Care Management services today including:   The Complex Care Management services include support from the care team which includes your Nurse Care Manager, Clinical Social Worker, or Pharmacist.  The Complex Care Management team is here to help remove barriers to the health concerns and goals most important to you. Complex Care Management services are voluntary, and the patient may decline or stop services at any time by request to their care team member.   Complex Care Management Consent Status: Patient agreed to services and verbal consent obtained.   Follow up plan:  Telephone appointment with complex care management team member scheduled for:  04/25/24  Encounter Outcome:  Patient Scheduled  Harlene Satterfield  [facility]  [facility], [facility] Guide  Direct Dial: [phone]  Fax [phone]

## 2024-04-25 ENCOUNTER — Telehealth

## 2024-04-27 ENCOUNTER — Ambulatory Visit: Payer: Self-pay

## 2024-05-04 ENCOUNTER — Ambulatory Visit (HOSPITAL_COMMUNITY): Admitting: Clinical

## 2024-05-17 ENCOUNTER — Other Ambulatory Visit

## 2024-05-17 ENCOUNTER — Encounter

## 2024-05-18 ENCOUNTER — Ambulatory Visit (HOSPITAL_COMMUNITY): Admitting: Clinical

## 2024-06-01 ENCOUNTER — Ambulatory Visit (HOSPITAL_COMMUNITY): Admitting: Clinical
